# Patient Record
Sex: Male | Born: 1950 | Race: Black or African American | Hispanic: No | Marital: Married | Smoking: Former smoker
Health system: Southern US, Community
[De-identification: ages and names within clinical notes are randomized; demographics above are authoritative.]

## PROBLEM LIST (undated history)

## (undated) DIAGNOSIS — E079 Disorder of thyroid, unspecified: Secondary | ICD-10-CM

## (undated) DIAGNOSIS — E785 Hyperlipidemia, unspecified: Secondary | ICD-10-CM

## (undated) DIAGNOSIS — I10 Essential (primary) hypertension: Secondary | ICD-10-CM

## (undated) HISTORY — DX: Hyperlipidemia, unspecified: E78.5

## (undated) HISTORY — DX: Disorder of thyroid, unspecified: E07.9

## (undated) HISTORY — PX: PROSTATE SURGERY: SHX751

## (undated) HISTORY — PX: OTHER SURGICAL HISTORY: SHX169

## (undated) NOTE — *Deleted (*Deleted)
Prostate Biopsy Procedure   Informed consent was obtained after discussing risks/benefits of the procedure.  A time out was performed to ensure correct patient identity.  Pre-Procedure: - Last PSA Level: 6.6 on 03/07/2020 - Gentamicin given prophylactically - Levaquin 500 mg administered PO -Transrectal Ultrasound performed revealing a *** gm prostate -No significant hypoechoic or median lobe noted  Procedure: - Prostate block performed using 10 cc 1% lidocaine and biopsies taken from sextant areas, a total of 12 under ultrasound guidance.  Post-Procedure: - Patient tolerated the procedure well - He was counseled to seek immediate medical attention if experiences any severe pain, significant bleeding, or fevers - Return in one week to discuss biopsy results   I, Theador Hawthorne, am acting as a scribe for Dr. Vanna Scotland.  {Add Production assistant, radio Statement}

---

## 2006-06-05 ENCOUNTER — Emergency Department: Payer: Self-pay

## 2007-09-16 ENCOUNTER — Ambulatory Visit: Payer: Self-pay | Admitting: Urology

## 2007-09-22 ENCOUNTER — Ambulatory Visit: Payer: Self-pay | Admitting: Urology

## 2008-11-20 ENCOUNTER — Inpatient Hospital Stay: Payer: Self-pay | Admitting: *Deleted

## 2010-04-11 ENCOUNTER — Observation Stay: Payer: Self-pay | Admitting: Internal Medicine

## 2010-04-19 ENCOUNTER — Ambulatory Visit: Payer: Self-pay | Admitting: Family Medicine

## 2010-04-28 ENCOUNTER — Ambulatory Visit: Payer: Self-pay | Admitting: Rheumatology

## 2010-12-26 ENCOUNTER — Ambulatory Visit: Payer: Self-pay | Admitting: Gastroenterology

## 2010-12-27 LAB — PATHOLOGY REPORT

## 2014-10-03 ENCOUNTER — Other Ambulatory Visit: Payer: Self-pay

## 2014-10-03 ENCOUNTER — Emergency Department
Admission: EM | Admit: 2014-10-03 | Discharge: 2014-10-03 | Disposition: A | Discharge status: Home / Self Care | Payer: 59 | Attending: Emergency Medicine | Admitting: Emergency Medicine

## 2014-10-03 ENCOUNTER — Encounter: Payer: Self-pay | Admitting: Emergency Medicine

## 2014-10-03 DIAGNOSIS — Z87891 Personal history of nicotine dependence: Secondary | ICD-10-CM | POA: Diagnosis not present

## 2014-10-03 DIAGNOSIS — R55 Syncope and collapse: Secondary | ICD-10-CM | POA: Diagnosis not present

## 2014-10-03 DIAGNOSIS — E860 Dehydration: Secondary | ICD-10-CM | POA: Insufficient documentation

## 2014-10-03 DIAGNOSIS — Z79899 Other long term (current) drug therapy: Secondary | ICD-10-CM | POA: Insufficient documentation

## 2014-10-03 DIAGNOSIS — I10 Essential (primary) hypertension: Secondary | ICD-10-CM | POA: Insufficient documentation

## 2014-10-03 DIAGNOSIS — R42 Dizziness and giddiness: Secondary | ICD-10-CM | POA: Diagnosis present

## 2014-10-03 DIAGNOSIS — R630 Anorexia: Secondary | ICD-10-CM | POA: Insufficient documentation

## 2014-10-03 HISTORY — DX: Essential (primary) hypertension: I10

## 2014-10-03 LAB — BASIC METABOLIC PANEL
Anion gap: 9 (ref 5–15)
BUN: 27 mg/dL — ABNORMAL HIGH (ref 6–20)
CHLORIDE: 104 mmol/L (ref 101–111)
CO2: 24 mmol/L (ref 22–32)
Calcium: 8.9 mg/dL (ref 8.9–10.3)
Creatinine, Ser: 1.49 mg/dL — ABNORMAL HIGH (ref 0.61–1.24)
GFR calc non Af Amer: 49 mL/min — ABNORMAL LOW (ref >60)
GFR, EST AFRICAN AMERICAN: 56 mL/min — AB (ref >60)
Glucose, Bld: 165 mg/dL — ABNORMAL HIGH (ref 65–99)
POTASSIUM: 3.4 mmol/L — AB (ref 3.5–5.1)
SODIUM: 137 mmol/L (ref 135–145)

## 2014-10-03 LAB — CBC WITH DIFFERENTIAL/PLATELET
Basophils Absolute: 0.1 10*3/uL (ref 0–0.1)
Basophils Relative: 1 %
Eosinophils Absolute: 0.2 10*3/uL (ref 0–0.7)
Eosinophils Relative: 2 %
HEMATOCRIT: 48.4 % (ref 40.0–52.0)
HEMOGLOBIN: 15.8 g/dL (ref 13.0–18.0)
LYMPHS PCT: 29 %
Lymphs Abs: 2.1 10*3/uL (ref 1.0–3.6)
MCH: 26.9 pg (ref 26.0–34.0)
MCHC: 32.7 g/dL (ref 32.0–36.0)
MCV: 82.3 fL (ref 80.0–100.0)
MONO ABS: 0.8 10*3/uL (ref 0.2–1.0)
MONOS PCT: 11 %
NEUTROS ABS: 4.3 10*3/uL (ref 1.4–6.5)
Neutrophils Relative %: 57 %
Platelets: 207 10*3/uL (ref 150–440)
RBC: 5.88 MIL/uL (ref 4.40–5.90)
RDW: 15 % — ABNORMAL HIGH (ref 11.5–14.5)
WBC: 7.4 10*3/uL (ref 3.8–10.6)

## 2014-10-03 LAB — TROPONIN I: Troponin I: <0.03 ng/mL (ref <0.031)

## 2014-10-03 LAB — GLUCOSE, CAPILLARY: GLUCOSE-CAPILLARY: 109 mg/dL — AB (ref 70–99)

## 2014-10-03 MED ORDER — SODIUM CHLORIDE 0.9 % IV BOLUS (SEPSIS)
1000.0000 mL | Freq: Once | INTRAVENOUS | Status: AC
  Administered 2014-10-03: 1000 mL via INTRAVENOUS

## 2014-10-03 MED ORDER — SODIUM CHLORIDE 0.9 % IV SOLN
1000.0000 mL | Freq: Once | INTRAVENOUS | Status: AC
  Administered 2014-10-03: 1000 mL via INTRAVENOUS

## 2014-10-03 NOTE — Discharge Instructions (Signed)
Dehydration, Adult  As we discussed I believe her episode of weakness and passing out today was because of dehydration. I recommend for the next 2 days that you make you are drinking plenty of fluids, stay well-hydrated, and avoid exertional activities. Please stop your amlodipine and losartan for at least the next 3 days.  Follow-up with your doctor in the next 1-2 days in the clinic.  Return to the ER right away should she develop another episode of weakness, you passout, you have chest pain, or any other new concerns or symptoms arise.  Dehydration means your body does not have as much fluid as it needs. Your kidneys, brain, and heart will not work properly without the right amount of fluids and salt.  HOME CARE  Ask your doctor how to replace body fluid losses (rehydrate).  Drink enough fluids to keep your pee (urine) clear or pale yellow.  Drink small amounts of fluids often if you feel sick to your stomach (nauseous) or throw up (vomit).  Eat like you normally do.  Avoid:  Foods or drinks high in sugar.  Bubbly (carbonated) drinks.  Juice.  Very hot or cold fluids.  Drinks with caffeine.  Fatty, greasy foods.  Alcohol.  Tobacco.  Eating too much.  Gelatin desserts.  Wash your hands to avoid spreading germs (bacteria, viruses).  Only take medicine as told by your doctor.  Keep all doctor visits as told. GET HELP RIGHT AWAY IF:   You cannot drink something without throwing up.  You get worse even with treatment.  Your vomit has blood in it or looks greenish.  Your poop (stool) has blood in it or looks black and tarry.  You have not peed in 6 to 8 hours.  You pee a small amount of very dark pee.  You have a fever.  You pass out (faint).  You have belly (abdominal) pain that gets worse or stays in one spot (localizes).  You have a rash, stiff neck, or bad headache.  You get easily annoyed, sleepy, or are hard to wake up.  You feel weak, dizzy,  or very thirsty. MAKE SURE YOU:   Understand these instructions.  Will watch your condition.  Will get help right away if you are not doing well or get worse. Document Released: 03/10/2009 Document Revised: 08/06/2011 Document Reviewed: 01/01/2011 [facility] Patient Information 2015 [place], [place]. This information is not intended to replace advice given to you by your health care provider. Make sure you discuss any questions you have with your health care provider.  Vasovagal Syncope, Adult Syncope, commonly known as fainting, is a temporary loss of consciousness. It occurs when the blood flow to the brain is reduced. Vasovagal syncope (also called neurocardiogenic syncope) is a fainting spell in which the blood flow to the brain is reduced because of a sudden drop in heart rate and blood pressure. Vasovagal syncope occurs when the brain and the cardiovascular system (blood vessels) do not adequately communicate and respond to each other. This is the most common cause of fainting. It often occurs in response to fear or some other type of emotional or physical stress. The body has a reaction in which the heart starts beating too slowly or the blood vessels expand, reducing blood pressure. This type of fainting spell is generally considered harmless. However, injuries can occur if a person takes a sudden fall during a fainting spell.  CAUSES  Vasovagal syncope occurs when a person's blood pressure and heart rate decrease suddenly,  usually in response to a trigger. Many things and situations can trigger an episode. Some of these include:   Pain.   Fear.   The sight of blood or medical procedures, such as blood being drawn from a vein.   Common activities, such as coughing, swallowing, stretching, or going to the bathroom.   Emotional stress.   Prolonged standing, especially in a warm environment.   Lack of sleep or rest.   Prolonged lack of food.   Prolonged lack of fluids.    Recent illness.  The use of certain drugs that affect blood pressure, such as cocaine, alcohol, marijuana, inhalants, and opiates.  SYMPTOMS  Before the fainting episode, you may:   Feel dizzy or light headed.   Become pale.  Sense that you are going to faint.   Feel like the room is spinning.   Have tunnel vision, only seeing directly in front of you.   Feel sick to your stomach (nauseous).   See spots or slowly lose vision.   Hear ringing in your ears.   Have a headache.   Feel warm and sweaty.   Feel a sensation of pins and needles. During the fainting spell, you will generally be unconscious for no longer than a couple minutes before waking up and returning to normal. If you get up too quickly before your body can recover, you may faint again. Some twitching or jerky movements may occur during the fainting spell.  DIAGNOSIS  Your caregiver will ask about your symptoms, take a medical history, and perform a physical exam. Various tests may be done to rule out other causes of fainting. These may include blood tests and tests to check the heart, such as electrocardiography, echocardiography, and possibly an electrophysiology study. When other causes have been ruled out, a test may be done to check the body's response to changes in position (tilt table test). TREATMENT  Most cases of vasovagal syncope do not require treatment. Your caregiver may recommend ways to avoid fainting triggers and may provide home strategies for preventing fainting. If you must be exposed to a possible trigger, you can drink additional fluids to help reduce your chances of having an episode of vasovagal syncope. If you have warning signs of an oncoming episode, you can respond by positioning yourself favorably (lying down). If your fainting spells continue, you may be given medicines to prevent fainting. Some medicines may help make you more resistant to repeated episodes of vasovagal syncope.  Special exercises or compression stockings may be recommended. In rare cases, the surgical placement of a pacemaker is considered. HOME CARE INSTRUCTIONS   Learn to identify the warning signs of vasovagal syncope.   Sit or lie down at the first warning sign of a fainting spell. If sitting, put your head down between your legs. If you lie down, swing your legs up in the air to increase blood flow to the brain.   Avoid hot tubs and saunas.  Avoid prolonged standing.  Drink enough fluids to keep your urine clear or pale yellow. Avoid caffeine.  Increase salt in your diet as directed by your caregiver.   If you have to stand for a long time, perform movements such as:   Crossing your legs.   Flexing and stretching your leg muscles.   Squatting.   Moving your legs.   Bending over.   Only take over-the-counter or prescription medicines as directed by your caregiver. Do not suddenly stop any medicines without asking your caregiver first.  SEEK MEDICAL CARE IF:   Your fainting spells continue or happen more frequently in spite of treatment.   You lose consciousness for more than a couple minutes.  You have fainting spells during or after exercising or after being startled.   You have new symptoms that occur with the fainting spells, such as:   Shortness of breath.  Chest pain.   Irregular heartbeat.   You have episodes of twitching or jerky movements that last longer than a few seconds.  You have episodes of twitching or jerky movements without obvious fainting. SEEK IMMEDIATE MEDICAL CARE IF:   You have injuries or bleeding after a fainting spell.   You have episodes of twitching or jerky movements that last longer than 5 minutes.   You have more than one spell of twitching or jerky movements before returning to consciousness after fainting. MAKE SURE YOU:   Understand these instructions.  Will watch your condition.  Will get help right away if you  are not doing well or get worse. Document Released: 04/30/2012 Document Reviewed: 04/30/2012 [facility] Patient Information 2015 [place]. This information is not intended to replace advice given to you by your health care provider. Make sure you discuss any questions you have with your health care provider.

## 2014-10-03 NOTE — ED Notes (Signed)
Pt ambulated per MD request. NAD noted at this time. Pt denies distress, SOB, light-headedness.

## 2014-10-03 NOTE — ED Provider Notes (Signed)
[facility] Emergency Department Provider Note  ____________________________________________  Time seen: Approximately 9:40 AM  I have reviewed the triage vital signs and the nursing notes.   HISTORY  Chief Complaint Dizziness and Near Syncope    HPI Donald Le is a 64 y.o. male the past medical history of hypertension and previous syncopal episode approximately 5 years ago.  Patient states that this morning when he got up he felt dizzy and lightheaded. He had prayer with his niece, but shortly after felt very lightheaded and had to lay down in the bed. At one point while sitting up, he briefly passed out in the presence of his family. This lasted only a few seconds. He said he continued to feel very weak and dizziness which prompted him to call for EMS.  On arrival to triage, the patient had a another syncopal episode witnessed by the triage Le. He did not fall or injure himself. Patient reports that he was feeling very nauseated dizzy and sweaty just prior to this occurring. He denies any chest pain, headache, no numbness no tingling, no weakness in the arm or leg, no difficulty breathing.  Patient states that this feels the same as 5 years ago when he required lots of hydration and had to come off and changed some of his blood pressure medicines.  He presently takes 2 antihypertensives, which she has not made any changes to recently. Patient has not had any recent exertional activity, has not been out in the sun recently, and cannot explain why he would be dehydrated though he feels this way.   Past Medical History  Diagnosis Date  . Hypertension     There are no active problems to display for this patient.   Past Surgical History  Procedure Laterality Date  . Prostate surgery      Current Outpatient Rx  Name  Route  Sig  Dispense  Refill  . amLODipine (NORVASC) 10 MG tablet   Oral   Take 10 mg by mouth daily.         Marland Kitchen  losartan-hydrochlorothiazide (HYZAAR) 100-12.5 MG per tablet   Oral   Take 1 tablet by mouth daily.           Allergies Review of patient's allergies indicates no known allergies.  History reviewed. No pertinent family history.  Social History History  Substance Use Topics  . Smoking status: Former Research scientist (life sciences)  . Smokeless tobacco: Never Used  . Alcohol Use: No    Review of Systems Constitutional: No fever/chills Eyes: No visual changes. ENT: No sore throat. Cardiovascular: Denies chest pain. No palpitations Respiratory: Denies shortness of breath. Gastrointestinal: No abdominal pain.  Patient did have nausea and was sweaty as per prior to passing out. .no vomiting.  No diarrhea.  No constipation. Genitourinary: Negative for dysuria. Musculoskeletal: Negative for back pain. Skin: Negative for rash. Neurological: Negative for headaches, focal weakness or numbness. See history of present illness  10-point ROS otherwise negative.  ____________________________________________   PHYSICAL EXAM:  VITAL SIGNS: ED Triage Vitals  Enc Vitals Group     BP 10/03/14 0936 74/60 mmHg     Pulse Rate 10/03/14 0931 42     Resp 10/03/14 0938 20     Temp 10/03/14 0931 97.8 F (36.6 C)     Temp Source 10/03/14 0931 Oral     SpO2 10/03/14 0936 96 %     Weight 10/03/14 0950 211 lb 4.8 oz (95.845 kg)     Height 10/03/14  4193 6\' 3"  (1.905 m)     Head Cir --      Peak Flow --      Pain Score --      Pain Loc --      Pain Edu? --      Excl. in [place]? --     Constitutional: Alert and oriented. Well appearing and in no acute distress. Eyes: Conjunctivae are normal. PERRL. EOMI. Head: Atraumatic. Nose: No congestion/rhinnorhea. Mouth/Throat: Mucous membranes are moist.  Oropharynx non-erythematous. Neck: No stridor.  Cardiovascular: Normal rate, regular rhythm. Grossly normal heart sounds.  Good peripheral circulation. Respiratory: Normal respiratory effort.  No retractions. Lungs  CTAB. Gastrointestinal: Soft and nontender. No distention. No abdominal bruits. No CVA tenderness. Musculoskeletal: No lower extremity tenderness nor edema.  No joint effusions. Neurologic:  Normal speech and language. No gross focal neurologic deficits are appreciated. Speech is normal. No gait instability. Skin:  Skin is warm, dry and intact. No rash noted. Psychiatric: Mood and affect are normal. Speech and behavior are normal.  Patient exam is normal at this time. He is fully awake and alert.  ----------------------------------------- 9:57 AM on 10/03/2014 -----------------------------------------  The patient reports that he is feeling improved presently, his nausea and lightheadedness has much improved. We are able to have the patient sit up and he states he no longer feels dizzy, his blood pressure is now 790 systolic after receiving IV hydration.  ____________________________________________   LABS (all labs ordered are listed, but only abnormal results are displayed)  Labs Reviewed  CBC WITH DIFFERENTIAL/PLATELET - Abnormal; Notable for the following:    RDW 15.0 (*)    All other components within normal limits  BASIC METABOLIC PANEL - Abnormal; Notable for the following:    Potassium 3.4 (*)    Glucose, Bld 165 (*)    BUN 27 (*)    Creatinine, Ser 1.49 (*)    GFR calc non Af Amer 49 (*)    GFR calc Af Amer 56 (*)    All other components within normal limits  GLUCOSE, CAPILLARY - Abnormal; Notable for the following:    Glucose-Capillary 109 (*)    All other components within normal limits  TROPONIN I  POCT CBG (FASTING - GLUCOSE)-MANUAL ENTRY   ____________________________________________  EKG   Date: 10/03/2014  Rate: 55  Rhythm: normal sinus rate at cardiac, with first-degree AV block.  QRS Axis: normal  Intervals: normal  ST/T Wave abnormalities: normal  Conduction Disutrbances: none  Narrative Interpretation: Sinus bradycardia with first-degree AV block.  No ST elevation. Otherwise unremarkable EKG.     ____________________________________________  RADIOLOGY   ____________________________________________   PROCEDURES  Procedure(s) performed: None  Critical Care performed: No  ____________________________________________   INITIAL IMPRESSION / ASSESSMENT AND PLAN / ED COURSE  Pertinent labs & imaging results that were available during my care of the patient were reviewed by me and considered in my medical decision making (see chart for details).  Syncope. Patient likely had syncope due to hypotension. The etiology of this is not 100% clear upon arrival, however it does not be appear to be acute blood loss and/or cardiac etiology. I suspect vasovagal versus orthostatic due to some dehydration and potentially due to his antihypertensive medications.  There is no evidence of neurologic abnormality. No evidence of hemorrhage. No evidence of cardiovascular involvement. No palpitations.  Plan of care is to hydrate generously, observe patient, obtain labs, and follow clinically. I anticipate if the patient's labs are normal and he appears  improved resolution of hypotension thereafter it would be most appropriate to have patient stop his antihypertensives at least briefly until he can follow up with primary care physician.  ----------------------------------------- 11:59 AM on 10/03/2014 -----------------------------------------  Reevaluation, patient is awake alert and oriented. He states he feels well at this time. He has no concerns. I discussed his labs with him. His labs seem to suggest some mild dehydration. He is well-hydrated now. He is normotensive.  I suspect his syncope was due to some mild dehydration, as he was quite orthostatic. He now appears well. We will check orthostatics and have him ambulate to see that he is feeling improved.  I discussed with the patient that I would hold his blood pressure medications for at  least the next 2-3 days. If he checks his blood pressure at home and notes that it's coming up into the 120s or higher on a regular basis then he can restart his amlodipine. He will follow up with Dr. Rockey Situ his primary care physician this week.  Patient agrees to return to the ER right away if he develops chest pain, shortness of breath, his symptoms of weakness return, he passes out again, or other new concerns or symptoms arise. ____________________________________________   FINAL CLINICAL IMPRESSION(S) / ED DIAGNOSES  Final diagnoses:  Dehydration  Syncope, vasovagal      Delman Kitten, MD 10/27/14 [phone]

## 2014-10-03 NOTE — ED Notes (Addendum)
Patient states this am he developed dizziness and diaphoresis. Denies worsening with movement. Denies any pain.  During triage, patient had syncopal episode with hypotension. Patient acted like he was about to vomit, but was not alert. Upper extremities became contracted. Patient's mental status returned to normal after event.

## 2014-10-03 NOTE — ED Notes (Signed)
NAD noted at time of D/C. Pt denies questions or concerns. Pt ambulatory to the lobby at this time.  

## 2015-01-21 DIAGNOSIS — I10 Essential (primary) hypertension: Secondary | ICD-10-CM | POA: Insufficient documentation

## 2015-01-21 DIAGNOSIS — E079 Disorder of thyroid, unspecified: Secondary | ICD-10-CM | POA: Insufficient documentation

## 2015-01-21 DIAGNOSIS — E785 Hyperlipidemia, unspecified: Secondary | ICD-10-CM | POA: Insufficient documentation

## 2015-01-24 ENCOUNTER — Encounter: Payer: 59 | Admitting: Family Medicine

## 2015-01-24 ENCOUNTER — Telehealth: Payer: Self-pay

## 2015-01-24 MED ORDER — LOSARTAN POTASSIUM-HCTZ 100-12.5 MG PO TABS: 1.0000 | ORAL_TABLET | Freq: Every day | ORAL | Status: DC

## 2015-01-24 MED ORDER — AMLODIPINE BESYLATE 10 MG PO TABS: 10.0000 mg | ORAL_TABLET | Freq: Every day | ORAL | Status: DC

## 2015-01-24 NOTE — Telephone Encounter (Signed)
Patient was within 89months from last CPE, now scheduled for 03/16/15 Please send Amlodipine 10mg  and Losartan-HCTZ 100-12.5  Refills to [facility]

## 2015-03-15 ENCOUNTER — Encounter: Payer: Self-pay | Admitting: Family Medicine

## 2015-03-15 ENCOUNTER — Ambulatory Visit (INDEPENDENT_AMBULATORY_CARE_PROVIDER_SITE_OTHER): Payer: 59 | Admitting: Family Medicine

## 2015-03-15 VITALS — BP 133/81 | HR 58 | Temp 97.9°F | Ht 69.8 in | Wt 208.0 lb

## 2015-03-15 DIAGNOSIS — E785 Hyperlipidemia, unspecified: Secondary | ICD-10-CM

## 2015-03-15 DIAGNOSIS — Z23 Encounter for immunization: Secondary | ICD-10-CM

## 2015-03-15 DIAGNOSIS — E079 Disorder of thyroid, unspecified: Secondary | ICD-10-CM | POA: Diagnosis not present

## 2015-03-15 DIAGNOSIS — I10 Essential (primary) hypertension: Secondary | ICD-10-CM | POA: Diagnosis not present

## 2015-03-15 DIAGNOSIS — Z0000 Encounter for general adult medical examination without abnormal findings: Secondary | ICD-10-CM

## 2015-03-15 LAB — URINALYSIS, ROUTINE W REFLEX MICROSCOPIC
Bilirubin, UA: NEGATIVE
Glucose, UA: NEGATIVE
Ketones, UA: NEGATIVE
LEUKOCYTES UA: NEGATIVE
Nitrite, UA: NEGATIVE
PH UA: 6.5 (ref 5.0–7.5)
PROTEIN UA: NEGATIVE
RBC, UA: NEGATIVE
Specific Gravity, UA: 1.015 (ref 1.005–1.030)
Urobilinogen, Ur: 0.2 mg/dL (ref 0.2–1.0)

## 2015-03-15 MED ORDER — SILDENAFIL CITRATE 20 MG PO TABS: 20.0000 mg | ORAL_TABLET | ORAL | Status: DC

## 2015-03-15 MED ORDER — LOSARTAN POTASSIUM-HCTZ 100-12.5 MG PO TABS: 1.0000 | ORAL_TABLET | Freq: Every day | ORAL | Status: DC

## 2015-03-15 MED ORDER — ATORVASTATIN CALCIUM 40 MG PO TABS: 40.0000 mg | ORAL_TABLET | Freq: Every day | ORAL | Status: DC

## 2015-03-15 MED ORDER — VARDENAFIL HCL 20 MG PO TABS: 20.0000 mg | ORAL_TABLET | Freq: Every day | ORAL | Status: DC | PRN

## 2015-03-15 MED ORDER — AMLODIPINE BESYLATE 10 MG PO TABS: 10.0000 mg | ORAL_TABLET | Freq: Every day | ORAL | Status: DC

## 2015-03-15 NOTE — Assessment & Plan Note (Signed)
The current medical regimen is effective;  continue present plan and medications.  

## 2015-03-15 NOTE — Assessment & Plan Note (Signed)
Not on meds and followed by endo

## 2015-03-15 NOTE — Progress Notes (Signed)
BP 133/81 mmHg  Pulse 58  Temp(Src) 97.9 F (36.6 C)  Ht 5' 9.8" (1.773 m)  Wt 208 lb (94.348 kg)  BMI 30.01 kg/m2  SpO2 99%   Subjective:    Patient ID: Donald Le, male    DOB: Feb 19, 1951, 64 y.o.   MRN: [id number]  HPI: Donald Le is a 64 y.o. male  Chief Complaint  Patient presents with  . Annual Exam   patient doing well with blood pressure medication no side effects no complaints takes faithfully. Same with cholesterol medicine no side effects complaints takes faithfully Wants any samples of ED medication  Relevant past medical, surgical, family and social history reviewed and updated as indicated. Interim medical history since our last visit reviewed. Allergies and medications reviewed and updated.  Review of Systems  Constitutional: Negative.   HENT: Negative.   Eyes: Negative.   Respiratory: Negative.   Cardiovascular: Negative.   Gastrointestinal: Negative.   Endocrine: Negative.   Genitourinary: Negative.   Musculoskeletal: Negative.   Skin: Negative.   Allergic/Immunologic: Negative.   Neurological: Negative.   Hematological: Negative.   Psychiatric/Behavioral: Negative.     Per HPI unless specifically indicated above     Objective:    BP 133/81 mmHg  Pulse 58  Temp(Src) 97.9 F (36.6 C)  Ht 5' 9.8" (1.773 m)  Wt 208 lb (94.348 kg)  BMI 30.01 kg/m2  SpO2 99%  Wt Readings from Last 3 Encounters:  03/15/15 208 lb (94.348 kg)  10/05/14 214 lb (97.07 kg)  10/03/14 211 lb 4.8 oz (95.845 kg)    Physical Exam  Constitutional: He is oriented to person, place, and time. He appears well-developed and well-nourished.  HENT:  Head: Normocephalic and atraumatic.  Right Ear: External ear normal.  Left Ear: External ear normal.  Eyes: Conjunctivae and EOM are normal. Pupils are equal, round, and reactive to light.  Neck: Normal range of motion. Neck supple.  Cardiovascular: Normal rate, regular rhythm, normal heart sounds and intact  distal pulses.   Pulmonary/Chest: Effort normal and breath sounds normal.  Abdominal: Soft. Bowel sounds are normal. There is no splenomegaly or hepatomegaly.  Genitourinary: Rectum normal, prostate normal and penis normal.  Musculoskeletal: Normal range of motion.  Neurological: He is alert and oriented to person, place, and time. He has normal reflexes.  Skin: No rash noted. No erythema.  Psychiatric: He has a normal mood and affect. His behavior is normal. Judgment and thought content normal.    Results for orders placed or performed during the hospital encounter of 10/03/14  CBC WITH DIFFERENTIAL  Result Value Ref Range   WBC 7.4 3.8 - 10.6 K/uL   RBC 5.88 4.40 - 5.90 MIL/uL   Hemoglobin 15.8 13.0 - 18.0 g/dL   HCT 48.4 40.0 - 52.0 %   MCV 82.3 80.0 - 100.0 fL   MCH 26.9 26.0 - 34.0 pg   MCHC 32.7 32.0 - 36.0 g/dL   RDW 15.0 (H) 11.5 - 14.5 %   Platelets 207 150 - 440 K/uL   Neutrophils Relative % 57 %   Neutro Abs 4.3 1.4 - 6.5 K/uL   Lymphocytes Relative 29 %   Lymphs Abs 2.1 1.0 - 3.6 K/uL   Monocytes Relative 11 %   Monocytes Absolute 0.8 0.2 - 1.0 K/uL   Eosinophils Relative 2 %   Eosinophils Absolute 0.2 0 - 0.7 K/uL   Basophils Relative 1 %   Basophils Absolute 0.1 0 - 0.1 K/uL  Basic  metabolic panel  Result Value Ref Range   Sodium 137 135 - 145 mmol/L   Potassium 3.4 (L) 3.5 - 5.1 mmol/L   Chloride 104 101 - 111 mmol/L   CO2 24 22 - 32 mmol/L   Glucose, Bld 165 (H) 65 - 99 mg/dL   BUN 27 (H) 6 - 20 mg/dL   Creatinine, Ser 1.49 (H) 0.61 - 1.24 mg/dL   Calcium 8.9 8.9 - 10.3 mg/dL   GFR calc non Af Amer 49 (L) >60 mL/min   GFR calc Af Amer 56 (L) >60 mL/min   Anion gap 9 5 - 15  Troponin I  Result Value Ref Range   Troponin I <0.03 <0.031 ng/mL  Glucose, capillary  Result Value Ref Range   Glucose-Capillary 109 (H) 70 - 99 mg/dL      Assessment & Plan:   Problem List Items Addressed This Visit      Cardiovascular and Mediastinum   Hypertension     The current medical regimen is effective;  continue present plan and medications.       Relevant Medications   amLODipine (NORVASC) 10 MG tablet   atorvastatin (LIPITOR) 40 MG tablet   losartan-hydrochlorothiazide (HYZAAR) 100-12.5 MG tablet   sildenafil (REVATIO) 20 MG tablet   vardenafil (LEVITRA) 20 MG tablet     Endocrine   Thyroid disease    Not on meds and followed by endo        Other   Hyperlipidemia    The current medical regimen is effective;  continue present plan and medications.       Relevant Medications   amLODipine (NORVASC) 10 MG tablet   atorvastatin (LIPITOR) 40 MG tablet   losartan-hydrochlorothiazide (HYZAAR) 100-12.5 MG tablet   sildenafil (REVATIO) 20 MG tablet   vardenafil (LEVITRA) 20 MG tablet    Other Visit Diagnoses    Immunization due    -  Primary    Relevant Orders    Flu Vaccine QUAD 36+ mos PF IM (Fluarix & Fluzone Quad PF) (Completed)    PE (physical exam), annual        Relevant Orders    Comprehensive metabolic panel    Lipid panel    CBC with Differential/Platelet    PSA    Urinalysis, Routine w reflex microscopic (not at [facility])    TSH        Follow up plan: Return in about 6 months (around 09/13/2015), or if symptoms worsen or fail to improve, for Recheck lipids, ALT AST, and BMP.

## 2015-03-16 ENCOUNTER — Telehealth: Payer: Self-pay | Admitting: Family Medicine

## 2015-03-16 DIAGNOSIS — R972 Elevated prostate specific antigen [PSA]: Secondary | ICD-10-CM

## 2015-03-16 LAB — LIPID PANEL
CHOLESTEROL TOTAL: 189 mg/dL (ref 100–199)
Chol/HDL Ratio: 3.9 ratio units (ref 0.0–5.0)
HDL: 48 mg/dL (ref >39)
LDL Calculated: 120 mg/dL — ABNORMAL HIGH (ref 0–99)
TRIGLYCERIDES: 106 mg/dL (ref 0–149)
VLDL CHOLESTEROL CAL: 21 mg/dL (ref 5–40)

## 2015-03-16 LAB — COMPREHENSIVE METABOLIC PANEL
ALK PHOS: 58 IU/L (ref 39–117)
ALT: 18 IU/L (ref 0–44)
AST: 21 IU/L (ref 0–40)
Albumin/Globulin Ratio: 1.6 (ref 1.1–2.5)
Albumin: 4.4 g/dL (ref 3.6–4.8)
BILIRUBIN TOTAL: 0.4 mg/dL (ref 0.0–1.2)
BUN/Creatinine Ratio: 10 (ref 10–22)
BUN: 10 mg/dL (ref 8–27)
CALCIUM: 9.7 mg/dL (ref 8.6–10.2)
CHLORIDE: 99 mmol/L (ref 97–106)
CO2: 24 mmol/L (ref 18–29)
CREATININE: 1.04 mg/dL (ref 0.76–1.27)
GFR calc Af Amer: 88 mL/min/{1.73_m2} (ref >59)
GFR calc non Af Amer: 76 mL/min/{1.73_m2} (ref >59)
Globulin, Total: 2.8 g/dL (ref 1.5–4.5)
Glucose: 118 mg/dL — ABNORMAL HIGH (ref 65–99)
Potassium: 4.2 mmol/L (ref 3.5–5.2)
Sodium: 138 mmol/L (ref 136–144)
TOTAL PROTEIN: 7.2 g/dL (ref 6.0–8.5)

## 2015-03-16 LAB — CBC WITH DIFFERENTIAL/PLATELET
BASOS: 1 %
Basophils Absolute: 0 10*3/uL (ref 0.0–0.2)
EOS (ABSOLUTE): 0.1 10*3/uL (ref 0.0–0.4)
EOS: 2 %
HEMATOCRIT: 47.8 % (ref 37.5–51.0)
Hemoglobin: 15.8 g/dL (ref 12.6–17.7)
Immature Grans (Abs): 0 10*3/uL (ref 0.0–0.1)
Immature Granulocytes: 0 %
LYMPHS ABS: 1.6 10*3/uL (ref 0.7–3.1)
Lymphs: 36 %
MCH: 27.1 pg (ref 26.6–33.0)
MCHC: 33.1 g/dL (ref 31.5–35.7)
MCV: 82 fL (ref 79–97)
MONOS ABS: 0.2 10*3/uL (ref 0.1–0.9)
Monocytes: 5 %
NEUTROS ABS: 2.4 10*3/uL (ref 1.4–7.0)
NEUTROS PCT: 56 %
Platelets: 219 10*3/uL (ref 150–379)
RBC: 5.83 x10E6/uL — ABNORMAL HIGH (ref 4.14–5.80)
RDW: 16.6 % — AB (ref 12.3–15.4)
WBC: 4.3 10*3/uL (ref 3.4–10.8)

## 2015-03-16 LAB — PSA: Prostate Specific Ag, Serum: 4 ng/mL (ref 0.0–4.0)

## 2015-03-16 LAB — TSH: TSH: 1.55 u[IU]/mL (ref 0.450–4.500)

## 2015-03-16 NOTE — Telephone Encounter (Signed)
-----   Message from Wynn Maudlin, [place] sent at 03/16/2015  5:12 PM EDT ----- labs

## 2015-03-16 NOTE — Telephone Encounter (Signed)
Phone call Discussed with patient elevated PSA we will recheck PSA this winter or spring.

## 2015-04-18 ENCOUNTER — Telehealth: Payer: Self-pay

## 2015-04-18 MED ORDER — TADALAFIL 20 MG PO TABS: 10.0000 mg | ORAL_TABLET | ORAL | Status: DC | PRN

## 2015-04-18 NOTE — Telephone Encounter (Signed)
Patient would like Rx for cialis sent to [facility].

## 2015-07-06 ENCOUNTER — Telehealth: Payer: Self-pay | Admitting: Family Medicine

## 2015-07-06 NOTE — Telephone Encounter (Signed)
Patient to make appointment.

## 2015-07-06 NOTE — Telephone Encounter (Signed)
Patient called stating that his insurance company wont cover his medication LEVITRA 20 MG TABLET. If it can be fixed, any questions feel free to call patient, thanks.

## 2015-07-18 ENCOUNTER — Ambulatory Visit: Payer: 59 | Admitting: Family Medicine

## 2015-09-13 ENCOUNTER — Ambulatory Visit: Payer: 59 | Admitting: Family Medicine

## 2015-09-14 ENCOUNTER — Encounter: Payer: Self-pay | Admitting: Family Medicine

## 2015-09-14 ENCOUNTER — Ambulatory Visit (INDEPENDENT_AMBULATORY_CARE_PROVIDER_SITE_OTHER): Payer: 59 | Admitting: Family Medicine

## 2015-09-14 VITALS — BP 137/81 | HR 58 | Temp 97.9°F | Ht 69.6 in | Wt 213.0 lb

## 2015-09-14 DIAGNOSIS — R972 Elevated prostate specific antigen [PSA]: Secondary | ICD-10-CM

## 2015-09-14 DIAGNOSIS — Z0000 Encounter for general adult medical examination without abnormal findings: Secondary | ICD-10-CM | POA: Diagnosis not present

## 2015-09-14 DIAGNOSIS — E785 Hyperlipidemia, unspecified: Secondary | ICD-10-CM | POA: Diagnosis not present

## 2015-09-14 DIAGNOSIS — I10 Essential (primary) hypertension: Secondary | ICD-10-CM | POA: Diagnosis not present

## 2015-09-14 LAB — LP+ALT+AST PICCOLO, WAIVED
ALT (SGPT) PICCOLO, WAIVED: 21 U/L (ref 10–47)
AST (SGOT) Piccolo, Waived: 27 U/L (ref 11–38)
CHOLESTEROL PICCOLO, WAIVED: 189 mg/dL (ref <200)
Chol/HDL Ratio Piccolo,Waive: 3.7 mg/dL
HDL CHOL PICCOLO, WAIVED: 50 mg/dL — AB (ref >59)
LDL CHOL CALC PICCOLO WAIVED: 118 mg/dL — AB (ref <100)
Triglycerides Piccolo,Waived: 100 mg/dL (ref <150)
VLDL CHOL CALC PICCOLO,WAIVE: 20 mg/dL (ref <30)

## 2015-09-14 NOTE — Assessment & Plan Note (Signed)
The current medical regimen is effective;  continue present plan and medications.  

## 2015-09-14 NOTE — Progress Notes (Signed)
BP 137/81 mmHg  Pulse 58  Temp(Src) 97.9 F (36.6 C)  Ht 5' 9.6" (1.768 m)  Wt 213 lb (96.616 kg)  BMI 30.91 kg/m2  SpO2 99%   Subjective:    Patient ID: Donald Le, male    DOB: December 04, 1950, 65 y.o.   MRN: [id number]  HPI: Donald Le is a 65 y.o. male  Chief Complaint  Patient presents with  . Hyperlipidemia  . Hypertension  Patient doing well on medications no side effects with good control of blood pressure and cholesterol takes medications faithfully Did have what felt like may be a bug or something in his ear earlier this week once his ears checked  Relevant past medical, surgical, family and social history reviewed and updated as indicated. Interim medical history since our last visit reviewed. Allergies and medications reviewed and updated.  Review of Systems  Constitutional: Negative.   Respiratory: Negative.   Cardiovascular: Negative.     Per HPI unless specifically indicated above     Objective:    BP 137/81 mmHg  Pulse 58  Temp(Src) 97.9 F (36.6 C)  Ht 5' 9.6" (1.768 m)  Wt 213 lb (96.616 kg)  BMI 30.91 kg/m2  SpO2 99%  Wt Readings from Last 3 Encounters:  09/14/15 213 lb (96.616 kg)  03/15/15 208 lb (94.348 kg)  10/05/14 214 lb (97.07 kg)    Physical Exam  Constitutional: He is oriented to person, place, and time. He appears well-developed and well-nourished. No distress.  HENT:  Head: Normocephalic and atraumatic.  Right Ear: Hearing and external ear normal.  Left Ear: Hearing and external ear normal.  Nose: Nose normal.  No evidence of foreign body ears completely clear  Eyes: Conjunctivae and lids are normal. Right eye exhibits no discharge. Left eye exhibits no discharge. No scleral icterus.  Pulmonary/Chest: Effort normal. No respiratory distress.  Musculoskeletal: Normal range of motion.  Neurological: He is alert and oriented to person, place, and time.  Skin: Skin is intact. No rash noted.  Psychiatric: He has a  normal mood and affect. His speech is normal and behavior is normal. Judgment and thought content normal. Cognition and memory are normal.    Results for orders placed or performed in visit on 03/15/15  Comprehensive metabolic panel  Result Value Ref Range   Glucose 118 (H) 65 - 99 mg/dL   BUN 10 8 - 27 mg/dL   Creatinine, Ser 1.04 0.76 - 1.27 mg/dL   GFR calc non Af Amer 76 >59 mL/min/1.73   GFR calc Af Amer 88 >59 mL/min/1.73   BUN/Creatinine Ratio 10 10 - 22   Sodium 138 136 - 144 mmol/L   Potassium 4.2 3.5 - 5.2 mmol/L   Chloride 99 97 - 106 mmol/L   CO2 24 18 - 29 mmol/L   Calcium 9.7 8.6 - 10.2 mg/dL   Total Protein 7.2 6.0 - 8.5 g/dL   Albumin 4.4 3.6 - 4.8 g/dL   Globulin, Total 2.8 1.5 - 4.5 g/dL   Albumin/Globulin Ratio 1.6 1.1 - 2.5   Bilirubin Total 0.4 0.0 - 1.2 mg/dL   Alkaline Phosphatase 58 39 - 117 IU/L   AST 21 0 - 40 IU/L   ALT 18 0 - 44 IU/L  Lipid panel  Result Value Ref Range   Cholesterol, Total 189 100 - 199 mg/dL   Triglycerides 106 0 - 149 mg/dL   HDL 48 >39 mg/dL   VLDL Cholesterol Cal 21 5 - 40 mg/dL  LDL Calculated 120 (H) 0 - 99 mg/dL   Chol/HDL Ratio 3.9 0.0 - 5.0 ratio units  CBC with Differential/Platelet  Result Value Ref Range   WBC 4.3 3.4 - 10.8 x10E3/uL   RBC 5.83 (H) 4.14 - 5.80 x10E6/uL   Hemoglobin 15.8 12.6 - 17.7 g/dL   Hematocrit 47.8 37.5 - 51.0 %   MCV 82 79 - 97 fL   MCH 27.1 26.6 - 33.0 pg   MCHC 33.1 31.5 - 35.7 g/dL   RDW 16.6 (H) 12.3 - 15.4 %   Platelets 219 150 - 379 x10E3/uL   Neutrophils 56 %   Lymphs 36 %   Monocytes 5 %   Eos 2 %   Basos 1 %   Neutrophils Absolute 2.4 1.4 - 7.0 x10E3/uL   Lymphocytes Absolute 1.6 0.7 - 3.1 x10E3/uL   Monocytes Absolute 0.2 0.1 - 0.9 x10E3/uL   EOS (ABSOLUTE) 0.1 0.0 - 0.4 x10E3/uL   Basophils Absolute 0.0 0.0 - 0.2 x10E3/uL   Immature Granulocytes 0 %   Immature Grans (Abs) 0.0 0.0 - 0.1 x10E3/uL  PSA  Result Value Ref Range   Prostate Specific Ag, Serum 4.0 0.0 - 4.0  ng/mL  Urinalysis, Routine w reflex microscopic (not at [facility])  Result Value Ref Range   Specific Gravity, UA 1.015 1.005 - 1.030   pH, UA 6.5 5.0 - 7.5   Color, UA Yellow Yellow   Appearance Ur Clear Clear   Leukocytes, UA Negative Negative   Protein, UA Negative Negative/Trace   Glucose, UA Negative Negative   Ketones, UA Negative Negative   RBC, UA Negative Negative   Bilirubin, UA Negative Negative   Urobilinogen, Ur 0.2 0.2 - 1.0 mg/dL   Nitrite, UA Negative Negative  TSH  Result Value Ref Range   TSH 1.550 0.450 - 4.500 uIU/mL      Assessment & Plan:   Problem List Items Addressed This Visit      Cardiovascular and Mediastinum   Hypertension - Primary    The current medical regimen is effective;  continue present plan and medications.       Relevant Orders   LP+ALT+AST Piccolo, Waived   Basic metabolic panel     Other   Hyperlipidemia    The current medical regimen is effective;  continue present plan and medications.       Relevant Orders   LP+ALT+AST Piccolo, Waived   Basic metabolic panel    Other Visit Diagnoses    Elevated PSA        Health care maintenance        Relevant Orders    Hepatitis C Antibody    HIV antibody        Follow up plan: Return in about 6 months (around 03/15/2016) for Physical Exam.

## 2015-09-15 ENCOUNTER — Encounter: Payer: Self-pay | Admitting: Family Medicine

## 2015-09-15 LAB — BASIC METABOLIC PANEL
BUN / CREAT RATIO: 12 (ref 10–24)
BUN: 14 mg/dL (ref 8–27)
CALCIUM: 9.2 mg/dL (ref 8.6–10.2)
CHLORIDE: 101 mmol/L (ref 96–106)
CO2: 20 mmol/L (ref 18–29)
Creatinine, Ser: 1.21 mg/dL (ref 0.76–1.27)
GFR calc Af Amer: 73 mL/min/{1.73_m2} (ref >59)
GFR calc non Af Amer: 63 mL/min/{1.73_m2} (ref >59)
Glucose: 163 mg/dL — ABNORMAL HIGH (ref 65–99)
Potassium: 4 mmol/L (ref 3.5–5.2)
Sodium: 141 mmol/L (ref 134–144)

## 2015-09-15 LAB — HIV ANTIBODY (ROUTINE TESTING W REFLEX): HIV SCREEN 4TH GENERATION: NONREACTIVE

## 2015-09-15 LAB — HEPATITIS C ANTIBODY

## 2015-09-15 LAB — PSA: PROSTATE SPECIFIC AG, SERUM: 4 ng/mL (ref 0.0–4.0)

## 2015-11-16 ENCOUNTER — Telehealth: Payer: Self-pay | Admitting: Family Medicine

## 2015-11-16 MED ORDER — LOSARTAN POTASSIUM-HCTZ 100-12.5 MG PO TABS: 1.0000 | ORAL_TABLET | Freq: Every day | ORAL | Status: DC

## 2015-11-16 NOTE — Telephone Encounter (Signed)
Pt would like to have refill for losartan-hydrochlorothiazide (HYZAAR) 100-12.5 MG tablet sent to [facility].

## 2016-02-24 ENCOUNTER — Encounter (INDEPENDENT_AMBULATORY_CARE_PROVIDER_SITE_OTHER): Payer: Self-pay

## 2016-03-21 ENCOUNTER — Ambulatory Visit (INDEPENDENT_AMBULATORY_CARE_PROVIDER_SITE_OTHER): Payer: BLUE CROSS/BLUE SHIELD | Admitting: Family Medicine

## 2016-03-21 ENCOUNTER — Encounter: Payer: Self-pay | Admitting: Family Medicine

## 2016-03-21 ENCOUNTER — Telehealth: Payer: Self-pay | Admitting: Family Medicine

## 2016-03-21 VITALS — BP 156/93 | HR 62 | Temp 97.5°F | Ht 70.5 in | Wt 210.0 lb

## 2016-03-21 DIAGNOSIS — E782 Mixed hyperlipidemia: Secondary | ICD-10-CM

## 2016-03-21 DIAGNOSIS — R972 Elevated prostate specific antigen [PSA]: Secondary | ICD-10-CM | POA: Diagnosis not present

## 2016-03-21 DIAGNOSIS — I10 Essential (primary) hypertension: Secondary | ICD-10-CM | POA: Diagnosis not present

## 2016-03-21 DIAGNOSIS — Z0000 Encounter for general adult medical examination without abnormal findings: Secondary | ICD-10-CM

## 2016-03-21 LAB — URINALYSIS, ROUTINE W REFLEX MICROSCOPIC
BILIRUBIN UA: NEGATIVE
Glucose, UA: NEGATIVE
KETONES UA: NEGATIVE
LEUKOCYTES UA: NEGATIVE
NITRITE UA: NEGATIVE
PROTEIN UA: NEGATIVE
RBC UA: NEGATIVE
SPEC GRAV UA: 1.015 (ref 1.005–1.030)
Urobilinogen, Ur: 0.2 mg/dL (ref 0.2–1.0)
pH, UA: 6 (ref 5.0–7.5)

## 2016-03-21 MED ORDER — LOSARTAN POTASSIUM-HCTZ 100-12.5 MG PO TABS: 1.0000 | ORAL_TABLET | Freq: Every day | ORAL | 4 refills | Status: DC

## 2016-03-21 MED ORDER — ATORVASTATIN CALCIUM 40 MG PO TABS: 40.0000 mg | ORAL_TABLET | Freq: Every day | ORAL | 4 refills | Status: DC

## 2016-03-21 MED ORDER — TADALAFIL 20 MG PO TABS: 10.0000 mg | ORAL_TABLET | ORAL | 11 refills | Status: DC | PRN

## 2016-03-21 MED ORDER — AMLODIPINE BESYLATE 10 MG PO TABS: 10.0000 mg | ORAL_TABLET | Freq: Every day | ORAL | 4 refills | Status: DC

## 2016-03-21 MED ORDER — LOSARTAN POTASSIUM-HCTZ 100-25 MG PO TABS: 1.0000 | ORAL_TABLET | Freq: Every day | ORAL | 4 refills | Status: DC

## 2016-03-21 NOTE — Telephone Encounter (Signed)
Pharmacy called and would like to get clarification on dosage for losartan-hydrochlorothiazide (HYZAAR) 100-25 MG tablet.

## 2016-03-21 NOTE — Assessment & Plan Note (Signed)
Blood pressure poor control with poor control will increase losartan hydrochlorothiazide to 100/25 discontinue the losartan hydrochlorothiazide 100/12.5. Gave patient written directions Recheck 1 month blood pressure control also check BMP.

## 2016-03-21 NOTE — Telephone Encounter (Signed)
Pharmacy needed verification on dose change.

## 2016-03-21 NOTE — Progress Notes (Signed)
BP (!) 156/93   Pulse 62   Temp 97.5 F (36.4 C)   Ht 5' 10.5" (1.791 m)   Wt 210 lb (95.3 kg)   SpO2 97%   BMI 29.71 kg/m    Subjective:    Patient ID: Donald Le, male    DOB: Sep 21, 1950, 65 y.o.   MRN: [id number]  HPI: Donald Le is a 65 y.o. male ```````````````````````````````````````````````````````````````````````````````` Chief Complaint  Patient presents with  . Annual Exam   Patient's for physical exam no real complaints or concerns working part-time now in retirement. Blood pressure checks at home have been doing well no complaints from medications which has been taken faithfully without side effects. Cholesterol doing well with medications no complaints or concerns Sinus doing okay. Relevant past medical, surgical, family and social history reviewed and updated as indicated. Interim medical history since our last visit reviewed. Allergies and medications reviewed and updated.  Review of Systems  Constitutional: Negative.   HENT: Negative.   Eyes: Negative.   Respiratory: Negative.   Cardiovascular: Negative.   Gastrointestinal: Negative.   Endocrine: Negative.   Genitourinary: Negative.   Musculoskeletal: Negative.   Skin: Negative.   Allergic/Immunologic: Negative.   Neurological: Negative.   Hematological: Negative.   Psychiatric/Behavioral: Negative.     Per HPI unless specifically indicated above     Objective:    BP (!) 156/93   Pulse 62   Temp 97.5 F (36.4 C)   Ht 5' 10.5" (1.791 m)   Wt 210 lb (95.3 kg)   SpO2 97%   BMI 29.71 kg/m   Wt Readings from Last 3 Encounters:  03/21/16 210 lb (95.3 kg)  09/14/15 213 lb (96.6 kg)  03/15/15 208 lb (94.3 kg)    Physical Exam  Constitutional: He is oriented to person, place, and time. He appears well-developed and well-nourished.  HENT:  Head: Normocephalic and atraumatic.  Right Ear: External ear normal.  Left Ear: External ear normal.  Eyes: Conjunctivae and EOM are  normal. Pupils are equal, round, and reactive to light.  Neck: Normal range of motion. Neck supple.  Cardiovascular: Normal rate, regular rhythm, normal heart sounds and intact distal pulses.   Pulmonary/Chest: Effort normal and breath sounds normal.  Abdominal: Soft. Bowel sounds are normal. There is no splenomegaly or hepatomegaly.  Genitourinary: Rectum normal, prostate normal and penis normal.  Musculoskeletal: Normal range of motion.  Neurological: He is alert and oriented to person, place, and time. He has normal reflexes.  Skin: No rash noted. No erythema.  Psychiatric: He has a normal mood and affect. His behavior is normal. Judgment and thought content normal.    Results for orders placed or performed in visit on 09/14/15  LP+ALT+AST Piccolo, [employer]  Result Value Ref Range   ALT (SGPT) Piccolo, Waived 21 10 - 47 U/L   AST (SGOT) Piccolo, Waived 27 11 - 38 U/L   Cholesterol Piccolo, Waived 189 <200 mg/dL   HDL Chol Piccolo, Waived 50 (L) >59 mg/dL   Triglycerides Piccolo,Waived 100 <150 mg/dL   Chol/HDL Ratio Piccolo,Waive 3.7 mg/dL   LDL Chol Calc Piccolo Waived 118 (H) <100 mg/dL   VLDL Chol Calc Piccolo,Waive 20 <30 mg/dL  Basic metabolic panel  Result Value Ref Range   Glucose 163 (H) 65 - 99 mg/dL   BUN 14 8 - 27 mg/dL   Creatinine, Ser 1.21 0.76 - 1.27 mg/dL   GFR calc non Af Amer 63 >59 mL/min/1.73   GFR calc Af Wyvonnia Lora  73 >59 mL/min/1.73   BUN/Creatinine Ratio 12 10 - 24   Sodium 141 134 - 144 mmol/L   Potassium 4.0 3.5 - 5.2 mmol/L   Chloride 101 96 - 106 mmol/L   CO2 20 18 - 29 mmol/L   Calcium 9.2 8.6 - 10.2 mg/dL  PSA  Result Value Ref Range   Prostate Specific Ag, Serum 4.0 0.0 - 4.0 ng/mL  Hepatitis C Antibody  Result Value Ref Range   Hep C Virus Ab <0.1 0.0 - 0.9 s/co ratio  HIV antibody  Result Value Ref Range   HIV Screen 4th Generation wRfx Non Reactive Non Reactive      Assessment & Plan:   Problem List Items Addressed This Visit       Cardiovascular and Mediastinum   Hypertension - Primary    Blood pressure poor control with poor control will increase losartan hydrochlorothiazide to 100/25 discontinue the losartan hydrochlorothiazide 100/12.5. Gave patient written directions Recheck 1 month blood pressure control also check BMP.      Relevant Medications   tadalafil (CIALIS) 20 MG tablet   atorvastatin (LIPITOR) 40 MG tablet   amLODipine (NORVASC) 10 MG tablet   losartan-hydrochlorothiazide (HYZAAR) 100-25 MG tablet   Other Relevant Orders   CBC with Differential/Platelet   Comprehensive metabolic panel   Urinalysis, Routine w reflex microscopic (not at [facility])     Other   Hyperlipidemia   Relevant Medications   tadalafil (CIALIS) 20 MG tablet   atorvastatin (LIPITOR) 40 MG tablet   amLODipine (NORVASC) 10 MG tablet   losartan-hydrochlorothiazide (HYZAAR) 100-25 MG tablet   Other Relevant Orders   Lipid Profile    Other Visit Diagnoses    Elevated PSA       Relevant Orders   Urinalysis, Routine w reflex microscopic (not at [facility])   PSA   PE (physical exam), annual       Relevant Orders   CBC with Differential/Platelet   Comprehensive metabolic panel   Urinalysis, Routine w reflex microscopic (not at [facility])   Lipid Profile   TSH   PSA       Follow up plan: Return in about 4 weeks (around 04/18/2016) for BMP blood pressure check.

## 2016-03-22 LAB — LIPID PANEL
CHOL/HDL RATIO: 3.8 ratio (ref 0.0–5.0)
Cholesterol, Total: 174 mg/dL (ref 100–199)
HDL: 46 mg/dL (ref >39)
LDL Calculated: 106 mg/dL — ABNORMAL HIGH (ref 0–99)
Triglycerides: 109 mg/dL (ref 0–149)
VLDL Cholesterol Cal: 22 mg/dL (ref 5–40)

## 2016-03-22 LAB — COMPREHENSIVE METABOLIC PANEL
A/G RATIO: 1.3 (ref 1.2–2.2)
ALBUMIN: 4.1 g/dL (ref 3.6–4.8)
ALT: 23 IU/L (ref 0–44)
AST: 23 IU/L (ref 0–40)
Alkaline Phosphatase: 62 IU/L (ref 39–117)
BILIRUBIN TOTAL: 0.5 mg/dL (ref 0.0–1.2)
BUN / CREAT RATIO: 10 (ref 10–24)
BUN: 12 mg/dL (ref 8–27)
CALCIUM: 9.3 mg/dL (ref 8.6–10.2)
CO2: 23 mmol/L (ref 18–29)
Chloride: 99 mmol/L (ref 96–106)
Creatinine, Ser: 1.23 mg/dL (ref 0.76–1.27)
GFR, EST AFRICAN AMERICAN: 71 mL/min/{1.73_m2} (ref >59)
GFR, EST NON AFRICAN AMERICAN: 62 mL/min/{1.73_m2} (ref >59)
GLOBULIN, TOTAL: 3.2 g/dL (ref 1.5–4.5)
Glucose: 141 mg/dL — ABNORMAL HIGH (ref 65–99)
POTASSIUM: 4 mmol/L (ref 3.5–5.2)
SODIUM: 139 mmol/L (ref 134–144)
TOTAL PROTEIN: 7.3 g/dL (ref 6.0–8.5)

## 2016-03-22 LAB — CBC WITH DIFFERENTIAL/PLATELET
BASOS: 0 %
Basophils Absolute: 0 10*3/uL (ref 0.0–0.2)
EOS (ABSOLUTE): 0.1 10*3/uL (ref 0.0–0.4)
EOS: 2 %
HEMATOCRIT: 48.3 % (ref 37.5–51.0)
Hemoglobin: 16.1 g/dL (ref 12.6–17.7)
IMMATURE GRANS (ABS): 0 10*3/uL (ref 0.0–0.1)
IMMATURE GRANULOCYTES: 0 %
LYMPHS: 32 %
Lymphocytes Absolute: 1.3 10*3/uL (ref 0.7–3.1)
MCH: 27.3 pg (ref 26.6–33.0)
MCHC: 33.3 g/dL (ref 31.5–35.7)
MCV: 82 fL (ref 79–97)
Monocytes Absolute: 0.4 10*3/uL (ref 0.1–0.9)
Monocytes: 8 %
NEUTROS ABS: 2.4 10*3/uL (ref 1.4–7.0)
NEUTROS PCT: 58 %
Platelets: 203 10*3/uL (ref 150–379)
RBC: 5.9 x10E6/uL — ABNORMAL HIGH (ref 4.14–5.80)
RDW: 15 % (ref 12.3–15.4)
WBC: 4.2 10*3/uL (ref 3.4–10.8)

## 2016-03-22 LAB — TSH: TSH: 2.23 u[IU]/mL (ref 0.450–4.500)

## 2016-03-22 LAB — PSA: PROSTATE SPECIFIC AG, SERUM: 17.2 ng/mL — AB (ref 0.0–4.0)

## 2016-03-23 ENCOUNTER — Telehealth: Payer: Self-pay | Admitting: Family Medicine

## 2016-03-23 DIAGNOSIS — R972 Elevated prostate specific antigen [PSA]: Secondary | ICD-10-CM

## 2016-03-23 NOTE — Telephone Encounter (Signed)
Please let him know that his labs came back normal except his prostate marker which came back pretty high. We'd like him to see the urologist and we've put in a referral (its an urgent one- please) for that appointment and they should be calling him. Everything else looked good.

## 2016-03-23 NOTE — Telephone Encounter (Signed)
Called patient, no answer, left message for patient to return my call.  

## 2016-03-26 NOTE — Telephone Encounter (Signed)
Please put referral in

## 2016-03-26 NOTE — Telephone Encounter (Signed)
It's in.  

## 2016-04-03 ENCOUNTER — Ambulatory Visit (INDEPENDENT_AMBULATORY_CARE_PROVIDER_SITE_OTHER): Payer: BLUE CROSS/BLUE SHIELD | Admitting: Urology

## 2016-04-03 ENCOUNTER — Encounter: Payer: Self-pay | Admitting: Urology

## 2016-04-03 VITALS — BP 131/88 | HR 103 | Ht 70.5 in | Wt 216.1 lb

## 2016-04-03 DIAGNOSIS — N400 Enlarged prostate without lower urinary tract symptoms: Secondary | ICD-10-CM

## 2016-04-03 DIAGNOSIS — R972 Elevated prostate specific antigen [PSA]: Secondary | ICD-10-CM

## 2016-04-03 DIAGNOSIS — N529 Male erectile dysfunction, unspecified: Secondary | ICD-10-CM
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
## 2016-04-04 LAB — PSA: PROSTATE SPECIFIC AG, SERUM: 5.5 ng/mL — AB (ref 0.0–4.0)

## 2016-04-09 ENCOUNTER — Telehealth: Payer: Self-pay

## 2016-04-09 DIAGNOSIS — R972 Elevated prostate specific antigen [PSA]: Secondary | ICD-10-CM

## 2016-04-09 NOTE — Telephone Encounter (Signed)
-----   Message from [name], MD sent at 04/05/2016  8:12 AM EST ----- PSA down significantly to 5.5 from 17.2. This is suggestive of inflammation as the cause. Overall, it still quite elevated. Like to see in 6 months with a repeat PSA to keep close track of this. Please arrange for six-month follow-up.  [name], MD

## 2016-04-09 NOTE — Telephone Encounter (Signed)
Spoke with pt in reference to PSA results. Made aware Dr. Erlene Quan would like to see him back in 43mo with a PSA prior. Pt voiced understanding. Pt requested appts be made and info mailed to him. PSA orders placed.

## 2016-04-23 ENCOUNTER — Ambulatory Visit (INDEPENDENT_AMBULATORY_CARE_PROVIDER_SITE_OTHER): Payer: BLUE CROSS/BLUE SHIELD | Admitting: Family Medicine

## 2016-04-23 ENCOUNTER — Encounter: Payer: Self-pay | Admitting: Family Medicine

## 2016-04-23 VITALS — BP 133/87 | HR 66 | Temp 98.0°F | Ht 71.0 in | Wt 214.4 lb

## 2016-04-23 DIAGNOSIS — I10 Essential (primary) hypertension: Secondary | ICD-10-CM | POA: Diagnosis not present

## 2016-04-23 DIAGNOSIS — Z23 Encounter for immunization: Secondary | ICD-10-CM | POA: Diagnosis not present

## 2016-04-23 DIAGNOSIS — R972 Elevated prostate specific antigen [PSA]: Secondary | ICD-10-CM | POA: Insufficient documentation

## 2016-04-23 DIAGNOSIS — Z2911 Encounter for prophylactic immunotherapy for respiratory syncytial virus (RSV): Secondary | ICD-10-CM

## 2016-04-23 NOTE — Assessment & Plan Note (Signed)
The current medical regimen is effective;  continue present plan and medications.  

## 2016-04-23 NOTE — Progress Notes (Signed)
   BP 133/87 (BP Location: Left Arm, Patient Position: Sitting, Cuff Size: Normal)   Pulse 66   Temp 98 F (36.7 C)   Ht 5\' 11"  (1.803 m)   Wt 214 lb 6.4 oz (97.3 kg)   BMI 29.90 kg/m    Subjective:    Patient ID: RAYANSH BOISE, male    DOB: 16-Feb-1951, 65 y.o.   MRN: [id number]  HPI: NAITHAN SANCHES is a 65 y.o. male  Chief Complaint  Patient presents with  . Follow-up  . Hypertension  Patient doing well with no concerns or problems with medications taking medications faithfully.  PSA reviewed reports from urology having good reports patient with no prostate issues. Was scheduled for 6 month follow-up PSA.    Relevant past medical, surgical, family and social history reviewed and updated as indicated. Interim medical history since our last visit reviewed. Allergies and medications reviewed and updated.  Review of Systems  Constitutional: Negative.   Respiratory: Negative.   Cardiovascular: Negative.     Per HPI unless specifically indicated above     Objective:    BP 133/87 (BP Location: Left Arm, Patient Position: Sitting, Cuff Size: Normal)   Pulse 66   Temp 98 F (36.7 C)   Ht 5\' 11"  (1.803 m)   Wt 214 lb 6.4 oz (97.3 kg)   BMI 29.90 kg/m   Wt Readings from Last 3 Encounters:  04/23/16 214 lb 6.4 oz (97.3 kg)  04/03/16 216 lb 1.6 oz (98 kg)  03/21/16 210 lb (95.3 kg)    Physical Exam  Constitutional: He is oriented to person, place, and time. He appears well-developed and well-nourished. No distress.  HENT:  Head: Normocephalic and atraumatic.  Right Ear: Hearing normal.  Left Ear: Hearing normal.  Nose: Nose normal.  Eyes: Conjunctivae and lids are normal. Right eye exhibits no discharge. Left eye exhibits no discharge. No scleral icterus.  Cardiovascular: Regular rhythm and normal heart sounds.   Pulmonary/Chest: Effort normal and breath sounds normal. No respiratory distress.  Musculoskeletal: Normal range of motion.  Neurological: He is  alert and oriented to person, place, and time.  Skin: Skin is intact. No rash noted.  Psychiatric: He has a normal mood and affect. His speech is normal and behavior is normal. Judgment and thought content normal. Cognition and memory are normal.    Results for orders placed or performed in visit on 04/03/16  PSA  Result Value Ref Range   Prostate Specific Ag, Serum 5.5 (H) 0.0 - 4.0 ng/mL      Assessment & Plan:   Problem List Items Addressed This Visit      Cardiovascular and Mediastinum   Hypertension - Primary    The current medical regimen is effective;  continue present plan and medications.       Relevant Orders   Basic metabolic panel     Other   Elevated PSA    Followed by urology and is come down dramatically has six-month follow-up with urology.       Other Visit Diagnoses    Need for shingles vaccine       Relevant Orders   Varicella-zoster vaccine subcutaneous (Completed)       Follow up plan: Return in about 5 months (around 09/21/2016) for BMP,  Lipids, ALT, AST.

## 2016-04-23 NOTE — Assessment & Plan Note (Signed)
Followed by urology and is come down dramatically has six-month follow-up with urology.

## 2016-04-24 ENCOUNTER — Encounter: Payer: Self-pay | Admitting: Family Medicine

## 2016-04-24 LAB — BASIC METABOLIC PANEL
BUN/Creatinine Ratio: 13 (ref 10–24)
BUN: 15 mg/dL (ref 8–27)
CALCIUM: 9.5 mg/dL (ref 8.6–10.2)
CHLORIDE: 99 mmol/L (ref 96–106)
CO2: 21 mmol/L (ref 18–29)
Creatinine, Ser: 1.17 mg/dL (ref 0.76–1.27)
GFR, EST AFRICAN AMERICAN: 76 mL/min/{1.73_m2} (ref >59)
GFR, EST NON AFRICAN AMERICAN: 65 mL/min/{1.73_m2} (ref >59)
Glucose: 164 mg/dL — ABNORMAL HIGH (ref 65–99)
POTASSIUM: 4.1 mmol/L (ref 3.5–5.2)
Sodium: 138 mmol/L (ref 134–144)

## 2016-09-20 ENCOUNTER — Encounter: Payer: Self-pay | Admitting: Family Medicine

## 2016-09-20 ENCOUNTER — Ambulatory Visit (INDEPENDENT_AMBULATORY_CARE_PROVIDER_SITE_OTHER): Payer: BLUE CROSS/BLUE SHIELD | Admitting: Family Medicine

## 2016-09-20 VITALS — BP 170/106 | HR 67 | Ht 71.0 in | Wt 213.0 lb

## 2016-09-20 DIAGNOSIS — E782 Mixed hyperlipidemia: Secondary | ICD-10-CM

## 2016-09-20 DIAGNOSIS — I10 Essential (primary) hypertension: Secondary | ICD-10-CM | POA: Diagnosis not present

## 2016-09-20 DIAGNOSIS — J3089 Other allergic rhinitis: Secondary | ICD-10-CM

## 2016-09-20 LAB — LP+ALT+AST PICCOLO, WAIVED
ALT (SGPT) Piccolo, Waived: 18 U/L (ref 10–47)
AST (SGOT) Piccolo, Waived: 29 U/L (ref 11–38)
CHOLESTEROL PICCOLO, WAIVED: 206 mg/dL — AB (ref <200)
Chol/HDL Ratio Piccolo,Waive: 3.5 mg/dL
HDL Chol Piccolo, Waived: 58 mg/dL (ref >59)
LDL CHOL CALC PICCOLO WAIVED: 123 mg/dL — AB (ref <100)
Triglycerides Piccolo,Waived: 122 mg/dL (ref <150)
VLDL CHOL CALC PICCOLO,WAIVE: 24 mg/dL (ref <30)

## 2016-09-20 MED ORDER — AMLODIPINE BESYLATE 10 MG PO TABS: 10.0000 mg | ORAL_TABLET | Freq: Every day | ORAL | 3 refills | Status: DC

## 2016-09-20 NOTE — Assessment & Plan Note (Signed)
Poor control off amlodipine patient will restart amlodipine reviewed medications with patient to eliminate confusion. Blood pressure also aggravated by over-the-counter medications for allergies reviewed acceptable allergy medications

## 2016-09-20 NOTE — Progress Notes (Signed)
BP (!) 170/106   Pulse 67   Ht 5\' 11"  (1.803 m)   Wt 213 lb (96.6 kg)   SpO2 97%   BMI 29.71 kg/m    Subjective:    Patient ID: Donald Le, male    DOB: 1951-04-05, 66 y.o.   MRN: [id number]  HPI: Donald Le is a 66 y.o. male  Chief Complaint  Patient presents with  . Follow-up  . Hypertension  . Cough  Patient with bad allergies been taking some over-the-counter cold and flu medications. On extensive review patient not taking amlodipine for blood pressure Patient taking other medications without problems or issues.   Relevant past medical, surgical, family and social history reviewed and updated as indicated. Interim medical history since our last visit reviewed. Allergies and medications reviewed and updated.  Review of Systems  Constitutional: Negative for chills, diaphoresis, fatigue and fever.  HENT: Positive for congestion, postnasal drip, rhinorrhea, sinus pressure and sneezing. Negative for sinus pain and sore throat.   Respiratory: Positive for cough.   Cardiovascular: Negative.     Per HPI unless specifically indicated above     Objective:    BP (!) 170/106   Pulse 67   Ht 5\' 11"  (1.803 m)   Wt 213 lb (96.6 kg)   SpO2 97%   BMI 29.71 kg/m   Wt Readings from Last 3 Encounters:  09/20/16 213 lb (96.6 kg)  04/23/16 214 lb 6.4 oz (97.3 kg)  04/03/16 216 lb 1.6 oz (98 kg)    Physical Exam  Constitutional: He is oriented to person, place, and time. He appears well-developed and well-nourished.  HENT:  Head: Normocephalic and atraumatic.  Right Ear: External ear normal.  Left Ear: External ear normal.  Mouth/Throat: Oropharynx is clear and moist.  Eyes: Conjunctivae and EOM are normal.  Neck: Normal range of motion.  Cardiovascular: Normal rate, regular rhythm and normal heart sounds.   Pulmonary/Chest: Effort normal and breath sounds normal.  Musculoskeletal: Normal range of motion.  Neurological: He is alert and oriented to  person, place, and time.  Skin: No erythema.  Psychiatric: He has a normal mood and affect. His behavior is normal. Judgment and thought content normal.    Results for orders placed or performed in visit on 37/62/83  Basic metabolic panel  Result Value Ref Range   Glucose 164 (H) 65 - 99 mg/dL   BUN 15 8 - 27 mg/dL   Creatinine, Ser 1.17 0.76 - 1.27 mg/dL   GFR calc non Af Amer 65 >59 mL/min/1.73   GFR calc Af Amer 76 >59 mL/min/1.73   BUN/Creatinine Ratio 13 10 - 24   Sodium 138 134 - 144 mmol/L   Potassium 4.1 3.5 - 5.2 mmol/L   Chloride 99 96 - 106 mmol/L   CO2 21 18 - 29 mmol/L   Calcium 9.5 8.6 - 10.2 mg/dL      Assessment & Plan:   Problem List Items Addressed This Visit      Cardiovascular and Mediastinum   Hypertension - Primary    Poor control off amlodipine patient will restart amlodipine reviewed medications with patient to eliminate confusion. Blood pressure also aggravated by over-the-counter medications for allergies reviewed acceptable allergy medications      Relevant Medications   amLODipine (NORVASC) 10 MG tablet   Other Relevant Orders   Basic metabolic panel   LP+ALT+AST Piccolo, Waived     Other   Hyperlipidemia    Results pending no issues with  medications      Relevant Medications   amLODipine (NORVASC) 10 MG tablet   Other Relevant Orders   Basic metabolic panel   LP+ALT+AST Piccolo, Waived   Environmental and seasonal allergies    Discussed safe allergy care and treatment nasal rinse Allegra and Claritin Nasacort or Flonase gave samples.          Follow up plan: Return in about 4 weeks (around 10/18/2016) for BMP.

## 2016-09-20 NOTE — Assessment & Plan Note (Signed)
Discussed safe allergy care and treatment nasal rinse Allegra and Claritin Nasacort or Flonase gave samples.

## 2016-09-20 NOTE — Assessment & Plan Note (Signed)
Results pending no issues with medications

## 2016-09-21 LAB — BASIC METABOLIC PANEL
BUN / CREAT RATIO: 12 (ref 10–24)
BUN: 15 mg/dL (ref 8–27)
CALCIUM: 9.7 mg/dL (ref 8.6–10.2)
CHLORIDE: 97 mmol/L (ref 96–106)
CO2: 22 mmol/L (ref 18–29)
Creatinine, Ser: 1.27 mg/dL (ref 0.76–1.27)
GFR calc non Af Amer: 59 mL/min/{1.73_m2} — ABNORMAL LOW (ref >59)
GFR, EST AFRICAN AMERICAN: 69 mL/min/{1.73_m2} (ref >59)
Glucose: 184 mg/dL — ABNORMAL HIGH (ref 65–99)
Potassium: 4.1 mmol/L (ref 3.5–5.2)
Sodium: 136 mmol/L (ref 134–144)

## 2016-09-24 ENCOUNTER — Encounter: Payer: Self-pay | Admitting: Family Medicine

## 2016-10-05 ENCOUNTER — Other Ambulatory Visit: Payer: BLUE CROSS/BLUE SHIELD

## 2016-10-10 ENCOUNTER — Ambulatory Visit: Payer: BLUE CROSS/BLUE SHIELD | Admitting: Urology

## 2017-05-09 ENCOUNTER — Ambulatory Visit (INDEPENDENT_AMBULATORY_CARE_PROVIDER_SITE_OTHER): Payer: Medicare Other | Admitting: Family Medicine

## 2017-05-09 ENCOUNTER — Encounter: Payer: Self-pay | Admitting: Family Medicine

## 2017-05-09 VITALS — BP 133/81 | HR 77 | Temp 98.2°F | Wt 212.0 lb

## 2017-05-09 DIAGNOSIS — J019 Acute sinusitis, unspecified: Secondary | ICD-10-CM

## 2017-05-09 MED ORDER — AMOXICILLIN-POT CLAVULANATE 875-125 MG PO TABS: 1.0000 | ORAL_TABLET | Freq: Two times a day (BID) | ORAL | 0 refills | Status: DC

## 2017-05-09 NOTE — Progress Notes (Signed)
BP 133/81   Pulse 77   Temp 98.2 F (36.8 C) (Oral)   Wt 212 lb (96.2 kg)   SpO2 99%   BMI 29.57 kg/m    Subjective:    Patient ID: Donald Le, male    DOB: 1951-01-09, 66 y.o.   MRN: [id number]  HPI: Donald Le is a 66 y.o. male  Chief Complaint  Patient presents with  . Sinusitis   Patient 2 weeks right facial pain discomfort sloshing-type sensation low-grade fever aches and pains and feeling bad.. No other systemic symptoms has been using over-the-counter medications without relief. Relevant past medical, surgical, family and social history reviewed and updated as indicated. Interim medical history since our last visit reviewed. Allergies and medications reviewed and updated.  Review of Systems  Constitutional: Positive for chills, diaphoresis, fatigue and fever.  HENT: Positive for congestion, nosebleeds, postnasal drip, rhinorrhea, sinus pressure, sinus pain and sore throat.   Respiratory: Positive for cough.   Cardiovascular: Negative.     Per HPI unless specifically indicated above     Objective:    BP 133/81   Pulse 77   Temp 98.2 F (36.8 C) (Oral)   Wt 212 lb (96.2 kg)   SpO2 99%   BMI 29.57 kg/m   Wt Readings from Last 3 Encounters:  05/09/17 212 lb (96.2 kg)  09/20/16 213 lb (96.6 kg)  04/23/16 214 lb 6.4 oz (97.3 kg)    Physical Exam  Constitutional: He is oriented to person, place, and time. He appears well-developed and well-nourished.  HENT:  Head: Normocephalic and atraumatic.  Right Ear: External ear normal.  Left Ear: External ear normal.  Mouth/Throat: Oropharyngeal exudate present.  Eyes: Conjunctivae and EOM are normal.  Neck: Normal range of motion.  Cardiovascular: Normal rate, regular rhythm and normal heart sounds.  Pulmonary/Chest: Effort normal and breath sounds normal.  Musculoskeletal: Normal range of motion.  Neurological: He is alert and oriented to person, place, and time.  Skin: No erythema.    Psychiatric: He has a normal mood and affect. His behavior is normal. Judgment and thought content normal.    Results for orders placed or performed in visit on 96/29/52  Basic metabolic panel  Result Value Ref Range   Glucose 184 (H) 65 - 99 mg/dL   BUN 15 8 - 27 mg/dL   Creatinine, Ser 1.27 0.76 - 1.27 mg/dL   GFR calc non Af Amer 59 (L) >59 mL/min/1.73   GFR calc Af Amer 69 >59 mL/min/1.73   BUN/Creatinine Ratio 12 10 - 24   Sodium 136 134 - 144 mmol/L   Potassium 4.1 3.5 - 5.2 mmol/L   Chloride 97 96 - 106 mmol/L   CO2 22 18 - 29 mmol/L   Calcium 9.7 8.6 - 10.2 mg/dL  LP+ALT+AST Piccolo, Waived  Result Value Ref Range   ALT (SGPT) Piccolo, Waived 18 10 - 47 U/L   AST (SGOT) Piccolo, Waived 29 11 - 38 U/L   Cholesterol Piccolo, Waived 206 (H) <200 mg/dL   HDL Chol Piccolo, Waived 58 >59 mg/dL   Triglycerides Piccolo,Waived 122 <150 mg/dL   Chol/HDL Ratio Piccolo,Waive 3.5 mg/dL   LDL Chol Calc Piccolo Waived 123 (H) <100 mg/dL   VLDL Chol Calc Piccolo,Waive 24 <30 mg/dL      Assessment & Plan:   Problem List Items Addressed This Visit    None    Visit Diagnoses    Acute sinusitis, recurrence not specified, unspecified location    -  Primary   Relevant Medications   amoxicillin-clavulanate (AUGMENTIN) 875-125 MG tablet     discussed sinusitis care and treatment Mucinex Tylenol sinus nasal rinse use of antibiotics avoiding side effects  Follow up plan: Return if symptoms worsen or fail to improve, for As scheduled.

## 2017-05-27 ENCOUNTER — Other Ambulatory Visit: Payer: Self-pay | Admitting: Family Medicine

## 2017-05-27 DIAGNOSIS — I10 Essential (primary) hypertension: Secondary | ICD-10-CM

## 2017-06-19 ENCOUNTER — Encounter: Payer: Self-pay | Admitting: Family Medicine

## 2017-06-19 ENCOUNTER — Ambulatory Visit (INDEPENDENT_AMBULATORY_CARE_PROVIDER_SITE_OTHER): Payer: Medicare HMO | Admitting: Family Medicine

## 2017-06-19 VITALS — BP 146/101 | HR 69 | Ht 71.0 in | Wt 214.0 lb

## 2017-06-19 DIAGNOSIS — E782 Mixed hyperlipidemia: Secondary | ICD-10-CM

## 2017-06-19 DIAGNOSIS — Z0000 Encounter for general adult medical examination without abnormal findings: Secondary | ICD-10-CM

## 2017-06-19 DIAGNOSIS — J302 Other seasonal allergic rhinitis: Secondary | ICD-10-CM | POA: Diagnosis not present

## 2017-06-19 DIAGNOSIS — R972 Elevated prostate specific antigen [PSA]: Secondary | ICD-10-CM

## 2017-06-19 DIAGNOSIS — Z1329 Encounter for screening for other suspected endocrine disorder: Secondary | ICD-10-CM

## 2017-06-19 DIAGNOSIS — N400 Enlarged prostate without lower urinary tract symptoms: Secondary | ICD-10-CM | POA: Diagnosis not present

## 2017-06-19 DIAGNOSIS — I10 Essential (primary) hypertension: Secondary | ICD-10-CM | POA: Diagnosis not present

## 2017-06-19 DIAGNOSIS — Z7189 Other specified counseling: Secondary | ICD-10-CM | POA: Insufficient documentation

## 2017-06-19 LAB — URINALYSIS, ROUTINE W REFLEX MICROSCOPIC
Bilirubin, UA: NEGATIVE
GLUCOSE, UA: NEGATIVE
KETONES UA: NEGATIVE
LEUKOCYTES UA: NEGATIVE
NITRITE UA: NEGATIVE
Protein, UA: NEGATIVE
RBC UA: NEGATIVE
SPEC GRAV UA: 1.015 (ref 1.005–1.030)
UUROB: 0.2 mg/dL (ref 0.2–1.0)
pH, UA: 6.5 (ref 5.0–7.5)

## 2017-06-19 MED ORDER — LOSARTAN POTASSIUM-HCTZ 100-25 MG PO TABS: 1.0000 | ORAL_TABLET | Freq: Every day | ORAL | 4 refills | Status: DC

## 2017-06-19 MED ORDER — ATORVASTATIN CALCIUM 40 MG PO TABS: 40.0000 mg | ORAL_TABLET | Freq: Every day | ORAL | 4 refills | Status: DC

## 2017-06-19 MED ORDER — AZELASTINE HCL 137 MCG/SPRAY NA SOLN: 2.0000 | Freq: Every day | NASAL | 12 refills | Status: DC

## 2017-06-19 MED ORDER — FEXOFENADINE HCL 180 MG PO TABS: 180.0000 mg | ORAL_TABLET | Freq: Every day | ORAL | 12 refills | Status: DC

## 2017-06-19 MED ORDER — FLUTICASONE PROPIONATE 50 MCG/ACT NA SUSP: 2.0000 | Freq: Every day | NASAL | 12 refills | Status: DC

## 2017-06-19 MED ORDER — AMLODIPINE BESYLATE 10 MG PO TABS: 10.0000 mg | ORAL_TABLET | Freq: Every day | ORAL | 3 refills | Status: DC

## 2017-06-19 NOTE — Assessment & Plan Note (Signed)
Discussed allergy care and treatment will also consider antihistamine nose sprays and Flonase.

## 2017-06-19 NOTE — Assessment & Plan Note (Signed)
stable °

## 2017-06-19 NOTE — Assessment & Plan Note (Signed)
Discussed hypertension blood pressure elevated today but most likely secondary to medication will change allergy medicines to Allegra or Claritin.

## 2017-06-19 NOTE — Assessment & Plan Note (Signed)
A voluntary discussion about advance care planning including the explanation and discussion of advance directives was extensively discussed  with the patient.  Explanation about the health care proxy and Living will was reviewed and packet with forms with explanation of how to fill them out was given.    

## 2017-06-19 NOTE — Progress Notes (Signed)
BP (!) 146/101   Pulse 69   Ht 5\' 11"  (1.803 m)   Wt 214 lb (97.1 kg)   SpO2 98%   BMI 29.85 kg/m    Subjective:    Patient ID: Donald Le, male    DOB: 04-Mar-1951, 67 y.o.   MRN: [id number]  HPI: Donald Le is a 67 y.o. male  Chief Complaint  Patient presents with  . Annual Exam  She is all in all doing well takes atorvastatin for cholesterol without problems Blood pressure medicines all in all doing well except for his had some allergy head cold type symptoms taking some phenylephrine 5 mg and notices it can make his blood pressure go up.  Relevant past medical, surgical, family and social history reviewed and updated as indicated. Interim medical history since our last visit reviewed. Allergies and medications reviewed and updated.  Review of Systems  Constitutional: Negative.   HENT: Negative.   Eyes: Negative.   Respiratory: Negative.   Cardiovascular: Negative.   Gastrointestinal: Negative.   Endocrine: Negative.   Genitourinary: Negative.   Musculoskeletal: Negative.   Skin: Negative.   Allergic/Immunologic: Negative.   Neurological: Negative.   Hematological: Negative.   Psychiatric/Behavioral: Negative.     Per HPI unless specifically indicated above     Objective:    BP (!) 146/101   Pulse 69   Ht 5\' 11"  (1.803 m)   Wt 214 lb (97.1 kg)   SpO2 98%   BMI 29.85 kg/m   Wt Readings from Last 3 Encounters:  06/19/17 214 lb (97.1 kg)  05/09/17 212 lb (96.2 kg)  09/20/16 213 lb (96.6 kg)    Physical Exam  Constitutional: He is oriented to person, place, and time. He appears well-developed and well-nourished.  HENT:  Head: Normocephalic and atraumatic.  Right Ear: External ear normal.  Left Ear: External ear normal.  Eyes: Conjunctivae and EOM are normal. Pupils are equal, round, and reactive to light.  Neck: Normal range of motion. Neck supple.  Cardiovascular: Normal rate, regular rhythm, normal heart sounds and intact distal  pulses.  Pulmonary/Chest: Effort normal and breath sounds normal.  Abdominal: Soft. Bowel sounds are normal. There is no splenomegaly or hepatomegaly.  Genitourinary: Rectum normal, prostate normal and penis normal.  Musculoskeletal: Normal range of motion.  Neurological: He is alert and oriented to person, place, and time. He has normal reflexes.  Skin: No rash noted. No erythema.  Psychiatric: He has a normal mood and affect. His behavior is normal. Judgment and thought content normal.    Results for orders placed or performed in visit on 02/40/97  Basic metabolic panel  Result Value Ref Range   Glucose 184 (H) 65 - 99 mg/dL   BUN 15 8 - 27 mg/dL   Creatinine, Ser 1.27 0.76 - 1.27 mg/dL   GFR calc non Af Amer 59 (L) >59 mL/min/1.73   GFR calc Af Amer 69 >59 mL/min/1.73   BUN/Creatinine Ratio 12 10 - 24   Sodium 136 134 - 144 mmol/L   Potassium 4.1 3.5 - 5.2 mmol/L   Chloride 97 96 - 106 mmol/L   CO2 22 18 - 29 mmol/L   Calcium 9.7 8.6 - 10.2 mg/dL  LP+ALT+AST Piccolo, Waived  Result Value Ref Range   ALT (SGPT) Piccolo, Waived 18 10 - 47 U/L   AST (SGOT) Piccolo, Waived 29 11 - 38 U/L   Cholesterol Piccolo, Waived 206 (H) <200 mg/dL   HDL Chol Piccolo, Waived 58 >  59 mg/dL   Triglycerides Piccolo,Waived 122 <150 mg/dL   Chol/HDL Ratio Piccolo,Waive 3.5 mg/dL   LDL Chol Calc Piccolo Waived 123 (H) <100 mg/dL   VLDL Chol Calc Piccolo,Waive 24 <30 mg/dL      Assessment & Plan:   Problem List Items Addressed This Visit      Cardiovascular and Mediastinum   Hypertension - Primary    Discussed hypertension blood pressure elevated today but most likely secondary to medication will change allergy medicines to Allegra or Claritin.      Relevant Medications   amLODipine (NORVASC) 10 MG tablet   atorvastatin (LIPITOR) 40 MG tablet   losartan-hydrochlorothiazide (HYZAAR) 100-25 MG tablet   Other Relevant Orders   CBC with Differential/Platelet   Comprehensive metabolic panel     Urinalysis, Routine w reflex microscopic     Respiratory   Allergic rhinitis, seasonal    Discussed allergy care and treatment will also consider antihistamine nose sprays and Flonase.      Relevant Medications   fexofenadine (ALLEGRA) 180 MG tablet   fluticasone (FLONASE) 50 MCG/ACT nasal spray   Azelastine HCl 137 MCG/SPRAY SOLN     Genitourinary   BPH (benign prostatic hyperplasia)    stable        Other   Hyperlipidemia    The current medical regimen is effective;  continue present plan and medications.       Relevant Medications   amLODipine (NORVASC) 10 MG tablet   atorvastatin (LIPITOR) 40 MG tablet   losartan-hydrochlorothiazide (HYZAAR) 100-25 MG tablet   Other Relevant Orders   CBC with Differential/Platelet   Comprehensive metabolic panel   Lipid panel   Urinalysis, Routine w reflex microscopic   Elevated PSA   Relevant Orders   PSA   Advanced care planning/counseling discussion    A voluntary discussion about advance care planning including the explanation and discussion of advance directives was extensively discussed  with the patient.  Explanation about the health care proxy and Living will was reviewed and packet with forms with explanation of how to fill them out was given.         Other Visit Diagnoses    Thyroid disorder screen       Relevant Orders   TSH   PE (physical exam), annual           Follow up plan: Return in about 6 months (around 12/17/2017) for BMP,  Lipids, ALT, AST.

## 2017-06-19 NOTE — Assessment & Plan Note (Signed)
The current medical regimen is effective;  continue present plan and medications.  

## 2017-06-20 ENCOUNTER — Telehealth: Payer: Self-pay | Admitting: Family Medicine

## 2017-06-20 DIAGNOSIS — R972 Elevated prostate specific antigen [PSA]: Secondary | ICD-10-CM

## 2017-06-20 LAB — CBC WITH DIFFERENTIAL/PLATELET
BASOS ABS: 0 10*3/uL (ref 0.0–0.2)
Basos: 1 %
EOS (ABSOLUTE): 0.1 10*3/uL (ref 0.0–0.4)
Eos: 3 %
Hematocrit: 45.5 % (ref 37.5–51.0)
Hemoglobin: 15 g/dL (ref 13.0–17.7)
IMMATURE GRANULOCYTES: 0 %
Immature Grans (Abs): 0 10*3/uL (ref 0.0–0.1)
LYMPHS ABS: 1.5 10*3/uL (ref 0.7–3.1)
Lymphs: 31 %
MCH: 26.4 pg — ABNORMAL LOW (ref 26.6–33.0)
MCHC: 33 g/dL (ref 31.5–35.7)
MCV: 80 fL (ref 79–97)
MONOCYTES: 11 %
Monocytes Absolute: 0.5 10*3/uL (ref 0.1–0.9)
NEUTROS PCT: 54 %
Neutrophils Absolute: 2.6 10*3/uL (ref 1.4–7.0)
Platelets: 269 10*3/uL (ref 150–379)
RBC: 5.68 x10E6/uL (ref 4.14–5.80)
RDW: 16.6 % — AB (ref 12.3–15.4)
WBC: 4.8 10*3/uL (ref 3.4–10.8)

## 2017-06-20 LAB — LIPID PANEL
CHOLESTEROL TOTAL: 172 mg/dL (ref 100–199)
Chol/HDL Ratio: 3.7 ratio (ref 0.0–5.0)
HDL: 47 mg/dL (ref >39)
LDL CALC: 106 mg/dL — AB (ref 0–99)
TRIGLYCERIDES: 96 mg/dL (ref 0–149)
VLDL CHOLESTEROL CAL: 19 mg/dL (ref 5–40)

## 2017-06-20 LAB — COMPREHENSIVE METABOLIC PANEL
ALK PHOS: 66 IU/L (ref 39–117)
ALT: 24 IU/L (ref 0–44)
AST: 22 IU/L (ref 0–40)
Albumin/Globulin Ratio: 1.3 (ref 1.2–2.2)
Albumin: 4.3 g/dL (ref 3.6–4.8)
BUN/Creatinine Ratio: 9 — ABNORMAL LOW (ref 10–24)
BUN: 11 mg/dL (ref 8–27)
Bilirubin Total: 0.4 mg/dL (ref 0.0–1.2)
CALCIUM: 9.7 mg/dL (ref 8.6–10.2)
CO2: 23 mmol/L (ref 20–29)
CREATININE: 1.17 mg/dL (ref 0.76–1.27)
Chloride: 97 mmol/L (ref 96–106)
GFR calc Af Amer: 75 mL/min/{1.73_m2} (ref >59)
GFR calc non Af Amer: 65 mL/min/{1.73_m2} (ref >59)
GLUCOSE: 149 mg/dL — AB (ref 65–99)
Globulin, Total: 3.3 g/dL (ref 1.5–4.5)
Potassium: 4.2 mmol/L (ref 3.5–5.2)
Sodium: 138 mmol/L (ref 134–144)
Total Protein: 7.6 g/dL (ref 6.0–8.5)

## 2017-06-20 LAB — TSH: TSH: 3.13 u[IU]/mL (ref 0.450–4.500)

## 2017-06-20 LAB — PSA: Prostate Specific Ag, Serum: 6.2 ng/mL — ABNORMAL HIGH (ref 0.0–4.0)

## 2017-06-20 NOTE — Telephone Encounter (Signed)
Phone call Discussed with patient elevated PSA will recheck 2 months.

## 2018-04-23 ENCOUNTER — Telehealth: Payer: Self-pay | Admitting: Family Medicine

## 2018-04-23 MED ORDER — LOSARTAN POTASSIUM 100 MG PO TABS: 100.0000 mg | ORAL_TABLET | Freq: Every day | ORAL | 3 refills | Status: DC

## 2018-04-23 MED ORDER — HYDROCHLOROTHIAZIDE 25 MG PO TABS: 25.0000 mg | ORAL_TABLET | Freq: Every day | ORAL | 3 refills | Status: DC

## 2018-04-23 NOTE — Telephone Encounter (Signed)
Copied from [place] [id number]. Topic: Quick Communication - See Telephone Encounter >> Apr 23, 2018  8:18 AM Ivar Drape wrote: CRM for notification. See Telephone encounter for: 04/23/18. Patient would like a refill on his losartan-hydrochlorothiazide (HYZAAR) 100-25 MG tablet medication, but his preferred pharmacy [facility] in [facility] stated they cannot get this medication in the 100-25 mg. The pharmacy suggested getting a new prescription with the Losartan and the Hydrochlorothiazide separated. Please advise.

## 2018-04-23 NOTE — Telephone Encounter (Signed)
See note below to call in separate prescriptions / Routing to office

## 2018-06-26 ENCOUNTER — Encounter: Payer: Self-pay | Admitting: Family Medicine

## 2018-06-26 ENCOUNTER — Ambulatory Visit (INDEPENDENT_AMBULATORY_CARE_PROVIDER_SITE_OTHER): Payer: Medicare HMO | Admitting: Family Medicine

## 2018-06-26 ENCOUNTER — Other Ambulatory Visit: Payer: Self-pay

## 2018-06-26 VITALS — BP 136/86 | HR 68 | Temp 97.5°F | Ht 68.5 in | Wt 210.0 lb

## 2018-06-26 DIAGNOSIS — E782 Mixed hyperlipidemia: Secondary | ICD-10-CM

## 2018-06-26 DIAGNOSIS — Z7189 Other specified counseling: Secondary | ICD-10-CM

## 2018-06-26 DIAGNOSIS — Z23 Encounter for immunization: Secondary | ICD-10-CM | POA: Diagnosis not present

## 2018-06-26 DIAGNOSIS — R972 Elevated prostate specific antigen [PSA]: Secondary | ICD-10-CM

## 2018-06-26 DIAGNOSIS — I10 Essential (primary) hypertension: Secondary | ICD-10-CM | POA: Diagnosis not present

## 2018-06-26 DIAGNOSIS — N400 Enlarged prostate without lower urinary tract symptoms: Secondary | ICD-10-CM

## 2018-06-26 DIAGNOSIS — Z1329 Encounter for screening for other suspected endocrine disorder: Secondary | ICD-10-CM | POA: Diagnosis not present

## 2018-06-26 LAB — URINALYSIS, ROUTINE W REFLEX MICROSCOPIC
Bilirubin, UA: NEGATIVE
Glucose, UA: NEGATIVE
Ketones, UA: NEGATIVE
Leukocytes, UA: NEGATIVE
NITRITE UA: NEGATIVE
Protein, UA: NEGATIVE
RBC UA: NEGATIVE
Specific Gravity, UA: 1.02 (ref 1.005–1.030)
Urobilinogen, Ur: 1 mg/dL (ref 0.2–1.0)
pH, UA: 7 (ref 5.0–7.5)

## 2018-06-26 MED ORDER — HYDROCHLOROTHIAZIDE 25 MG PO TABS: 25.0000 mg | ORAL_TABLET | Freq: Every day | ORAL | 4 refills | Status: DC

## 2018-06-26 MED ORDER — AMLODIPINE BESYLATE 10 MG PO TABS: 10.0000 mg | ORAL_TABLET | Freq: Every day | ORAL | 4 refills | Status: DC

## 2018-06-26 MED ORDER — ATORVASTATIN CALCIUM 40 MG PO TABS: 40.0000 mg | ORAL_TABLET | Freq: Every day | ORAL | 4 refills | Status: DC

## 2018-06-26 MED ORDER — LOSARTAN POTASSIUM 100 MG PO TABS: 100.0000 mg | ORAL_TABLET | Freq: Every day | ORAL | 4 refills | Status: DC

## 2018-06-26 NOTE — Progress Notes (Signed)
BP 136/86 (BP Location: Left Arm)   Pulse 68   Temp (!) 97.5 F (36.4 C) (Oral)   Ht 5' 8.5" (1.74 m)   Wt 210 lb (95.3 kg)   SpO2 98%   BMI 31.46 kg/m    Subjective:    Patient ID: Donald Le, male    DOB: 11-05-1950, 68 y.o.   MRN: [id number]  HPI: Donald Le is a 68 y.o. male  Chief Complaint  Patient presents with  . Annual Exam    been having lots of sinuses, been taking otc medications  Patient follow-up blood pressure doing well taking medications without problems and good control. Taking cholesterol medicines again without problems or issues. Allergies no allergy further symptoms not taking allergy medicine still has some occasional sinus drainage.  Relevant past medical, surgical, family and social history reviewed and updated as indicated. Interim medical history since our last visit reviewed. Allergies and medications reviewed and updated.  Review of Systems  Constitutional: Negative.   HENT: Negative.   Eyes: Negative.   Respiratory: Negative.   Cardiovascular: Negative.   Gastrointestinal: Negative.   Endocrine: Negative.   Genitourinary: Negative.   Musculoskeletal: Negative.   Skin: Negative.   Allergic/Immunologic: Negative.   Neurological: Negative.   Hematological: Negative.   Psychiatric/Behavioral: Negative.     Per HPI unless specifically indicated above     Objective:    BP 136/86 (BP Location: Left Arm)   Pulse 68   Temp (!) 97.5 F (36.4 C) (Oral)   Ht 5' 8.5" (1.74 m)   Wt 210 lb (95.3 kg)   SpO2 98%   BMI 31.46 kg/m   Wt Readings from Last 3 Encounters:  06/26/18 210 lb (95.3 kg)  06/19/17 214 lb (97.1 kg)  05/09/17 212 lb (96.2 kg)    Physical Exam Constitutional:      Appearance: He is well-developed.  HENT:     Head: Normocephalic and atraumatic.     Right Ear: External ear normal.     Left Ear: External ear normal.  Eyes:     Conjunctiva/sclera: Conjunctivae normal.     Pupils: Pupils are equal,  round, and reactive to light.  Neck:     Musculoskeletal: Normal range of motion and neck supple.  Cardiovascular:     Rate and Rhythm: Normal rate and regular rhythm.     Heart sounds: Normal heart sounds.  Pulmonary:     Effort: Pulmonary effort is normal.     Breath sounds: Normal breath sounds.  Abdominal:     General: Bowel sounds are normal.     Palpations: Abdomen is soft. There is no hepatomegaly or splenomegaly.  Genitourinary:    Penis: Normal.      Rectum: Normal.     Comments: BPH changes Small ingunial hernia lt Musculoskeletal: Normal range of motion.  Skin:    Findings: No erythema or rash.  Neurological:     Mental Status: He is alert and oriented to person, place, and time.     Deep Tendon Reflexes: Reflexes are normal and symmetric.  Psychiatric:        Behavior: Behavior normal.        Thought Content: Thought content normal.        Judgment: Judgment normal.     Results for orders placed or performed in visit on 06/26/18  Urinalysis, Routine w reflex microscopic  Result Value Ref Range   Specific Gravity, UA 1.020 1.005 - 1.030   pH, UA  7.0 5.0 - 7.5   Color, UA Yellow Yellow   Appearance Ur Clear Clear   Leukocytes, UA Negative Negative   Protein, UA Negative Negative/Trace   Glucose, UA Negative Negative   Ketones, UA Negative Negative   RBC, UA Negative Negative   Bilirubin, UA Negative Negative   Urobilinogen, Ur 1.0 0.2 - 1.0 mg/dL   Nitrite, UA Negative Negative      Assessment & Plan:   Problem List Items Addressed This Visit      Cardiovascular and Mediastinum   Hypertension    The current medical regimen is effective;  continue present plan and medications.       Relevant Medications   amLODipine (NORVASC) 10 MG tablet   losartan (COZAAR) 100 MG tablet   hydrochlorothiazide (HYDRODIURIL) 25 MG tablet   atorvastatin (LIPITOR) 40 MG tablet   Other Relevant Orders   Urinalysis, Routine w reflex microscopic (Completed)    Comprehensive metabolic panel   CBC with Differential/Platelet     Genitourinary   BPH (benign prostatic hyperplasia)    Stable         Other   Hyperlipidemia    The current medical regimen is effective;  continue present plan and medications.       Relevant Medications   amLODipine (NORVASC) 10 MG tablet   losartan (COZAAR) 100 MG tablet   hydrochlorothiazide (HYDRODIURIL) 25 MG tablet   atorvastatin (LIPITOR) 40 MG tablet   Other Relevant Orders   Lipid Panel w/o Chol/HDL Ratio   Elevated PSA   Relevant Orders   PSA   Advanced care planning/counseling discussion    A voluntary discussion about advanced care planning including explanation and discussion of advanced directives was extentively discussed with the patient.  Explained about the healthcare proxy and living will was reviewed and packet with forms with expiration of how to fill them out was given.  Time spent: Encounter 16+ min individuals present: Patient       Other Visit Diagnoses    Flu vaccine need    -  Primary   Relevant Orders   Flu vaccine HIGH DOSE PF   Need for pneumococcal vaccine       Relevant Orders   Pneumococcal conjugate vaccine 13-valent IM   Thyroid disorder screen       Relevant Orders   TSH       Follow up plan: Return in about 6 months (around 12/25/2018) for BMP,  Lipids, ALT, AST.

## 2018-06-26 NOTE — Assessment & Plan Note (Signed)
A voluntary discussion about advanced care planning including explanation and discussion of advanced directives was extentively discussed with the patient.  Explained about the healthcare proxy and living will was reviewed and packet with forms with expiration of how to fill them out was given.  Time spent: Encounter 16+ min individuals present: Patient 

## 2018-06-26 NOTE — Assessment & Plan Note (Signed)
The current medical regimen is effective;  continue present plan and medications.  

## 2018-06-26 NOTE — Assessment & Plan Note (Signed)
Stable

## 2018-06-27 LAB — LIPID PANEL W/O CHOL/HDL RATIO
Cholesterol, Total: 166 mg/dL (ref 100–199)
HDL: 48 mg/dL (ref >39)
LDL Calculated: 83 mg/dL (ref 0–99)
Triglycerides: 175 mg/dL — ABNORMAL HIGH (ref 0–149)
VLDL Cholesterol Cal: 35 mg/dL (ref 5–40)

## 2018-06-27 LAB — COMPREHENSIVE METABOLIC PANEL
ALK PHOS: 59 IU/L (ref 39–117)
ALT: 28 IU/L (ref 0–44)
AST: 27 IU/L (ref 0–40)
Albumin/Globulin Ratio: 1.5 (ref 1.2–2.2)
Albumin: 4.4 g/dL (ref 3.8–4.8)
BUN/Creatinine Ratio: 11 (ref 10–24)
BUN: 15 mg/dL (ref 8–27)
Bilirubin Total: 0.3 mg/dL (ref 0.0–1.2)
CO2: 22 mmol/L (ref 20–29)
Calcium: 9.8 mg/dL (ref 8.6–10.2)
Chloride: 103 mmol/L (ref 96–106)
Creatinine, Ser: 1.31 mg/dL — ABNORMAL HIGH (ref 0.76–1.27)
GFR calc Af Amer: 65 mL/min/{1.73_m2} (ref >59)
GFR calc non Af Amer: 56 mL/min/{1.73_m2} — ABNORMAL LOW (ref >59)
Globulin, Total: 3 g/dL (ref 1.5–4.5)
Glucose: 105 mg/dL — ABNORMAL HIGH (ref 65–99)
Potassium: 3.9 mmol/L (ref 3.5–5.2)
Sodium: 142 mmol/L (ref 134–144)
Total Protein: 7.4 g/dL (ref 6.0–8.5)

## 2018-06-27 LAB — TSH: TSH: 2.97 u[IU]/mL (ref 0.450–4.500)

## 2018-06-27 LAB — CBC WITH DIFFERENTIAL/PLATELET
Basophils Absolute: 0 10*3/uL (ref 0.0–0.2)
Basos: 1 %
EOS (ABSOLUTE): 0.1 10*3/uL (ref 0.0–0.4)
Eos: 3 %
Hematocrit: 48.5 % (ref 37.5–51.0)
Hemoglobin: 16.6 g/dL (ref 13.0–17.7)
Immature Grans (Abs): 0 10*3/uL (ref 0.0–0.1)
Immature Granulocytes: 0 %
Lymphocytes Absolute: 1.5 10*3/uL (ref 0.7–3.1)
Lymphs: 30 %
MCH: 27.2 pg (ref 26.6–33.0)
MCHC: 34.2 g/dL (ref 31.5–35.7)
MCV: 80 fL (ref 79–97)
MONOS ABS: 0.4 10*3/uL (ref 0.1–0.9)
Monocytes: 8 %
Neutrophils Absolute: 3 10*3/uL (ref 1.4–7.0)
Neutrophils: 58 %
PLATELETS: 212 10*3/uL (ref 150–450)
RBC: 6.1 x10E6/uL — ABNORMAL HIGH (ref 4.14–5.80)
RDW: 15.1 % (ref 11.6–15.4)
WBC: 5.1 10*3/uL (ref 3.4–10.8)

## 2018-06-27 LAB — PSA: Prostate Specific Ag, Serum: 5.2 ng/mL — ABNORMAL HIGH (ref 0.0–4.0)

## 2018-07-10 ENCOUNTER — Telehealth: Payer: Self-pay

## 2018-07-10 DIAGNOSIS — Z1211 Encounter for screening for malignant neoplasm of colon: Secondary | ICD-10-CM

## 2018-07-10 NOTE — Telephone Encounter (Signed)
Copied from [place] [phone]. Topic: General - Other >> Jul 10, 2018  9:59 AM [name] wrote: Reason for CRM:pt calling to check status of labs from most recent physical. Please advise >> Jul 10, 2018 10:08 AM [name], CMA wrote: Please advise.  >> Jul 10, 2018 12:39 PM [name], MD wrote: Call his blood work was stable with his PSA lower than in years. >> Jul 10, 2018  1:43 PM [name], [name], CMA wrote: Patient notified. Patient requesting an order for a colonoscopy, order placed.

## 2018-07-15 ENCOUNTER — Other Ambulatory Visit: Payer: Self-pay

## 2018-07-15 ENCOUNTER — Telehealth: Payer: Self-pay | Admitting: Gastroenterology

## 2018-07-15 DIAGNOSIS — Z1211 Encounter for screening for malignant neoplasm of colon: Secondary | ICD-10-CM

## 2018-07-15 NOTE — Telephone Encounter (Signed)
Patient called returning michelle's call to schedule a colonoscopy

## 2018-07-15 NOTE — Telephone Encounter (Signed)
Call returned.  Patient has been scheduled for his colonoscopy 08/19/18 at [facility] with Dr. Bonna Gains.  Thanks [employer]

## 2018-08-12 ENCOUNTER — Encounter: Payer: Self-pay | Admitting: Anesthesiology

## 2018-08-12 ENCOUNTER — Encounter: Payer: Self-pay | Admitting: *Deleted

## 2018-08-12 ENCOUNTER — Other Ambulatory Visit: Payer: Self-pay

## 2018-08-14 ENCOUNTER — Telehealth: Payer: Self-pay

## 2018-08-14 NOTE — Telephone Encounter (Signed)
LVM on patients home and cell to inform him his colonoscopy scheduled for 08/19/18 at [facility] has been canceled due to [place] [address] and we will call him back at a later time to reschedule.  Thanks [employer]

## 2018-08-19 ENCOUNTER — Ambulatory Visit: Admission: RE | Admit: 2018-08-19 | Payer: Medicare HMO | Source: Home / Self Care | Admitting: Gastroenterology

## 2018-08-19 SURGERY — COLONOSCOPY WITH PROPOFOL
Anesthesia: Choice

## 2018-09-01 ENCOUNTER — Telehealth: Payer: Self-pay

## 2018-09-01 NOTE — Telephone Encounter (Signed)
LVM asking patient to call office if he would like to reschedule his colonoscopy for the month of June/July.  Thanks [employer]

## 2019-07-27 ENCOUNTER — Ambulatory Visit (INDEPENDENT_AMBULATORY_CARE_PROVIDER_SITE_OTHER): Payer: Medicare HMO | Admitting: Family Medicine

## 2019-07-27 ENCOUNTER — Encounter: Payer: Self-pay | Admitting: Family Medicine

## 2019-07-27 ENCOUNTER — Other Ambulatory Visit: Payer: Self-pay

## 2019-07-27 VITALS — BP 136/84 | HR 61 | Temp 98.3°F | Ht 69.5 in | Wt 201.0 lb

## 2019-07-27 DIAGNOSIS — E782 Mixed hyperlipidemia: Secondary | ICD-10-CM | POA: Diagnosis not present

## 2019-07-27 DIAGNOSIS — N400 Enlarged prostate without lower urinary tract symptoms: Secondary | ICD-10-CM | POA: Diagnosis not present

## 2019-07-27 DIAGNOSIS — Z1211 Encounter for screening for malignant neoplasm of colon: Secondary | ICD-10-CM

## 2019-07-27 DIAGNOSIS — R972 Elevated prostate specific antigen [PSA]: Secondary | ICD-10-CM

## 2019-07-27 DIAGNOSIS — I10 Essential (primary) hypertension: Secondary | ICD-10-CM | POA: Diagnosis not present

## 2019-07-27 DIAGNOSIS — Z7189 Other specified counseling: Secondary | ICD-10-CM | POA: Diagnosis not present

## 2019-07-27 DIAGNOSIS — Z0000 Encounter for general adult medical examination without abnormal findings: Secondary | ICD-10-CM

## 2019-07-27 DIAGNOSIS — Z1212 Encounter for screening for malignant neoplasm of rectum: Secondary | ICD-10-CM

## 2019-07-27 LAB — UA/M W/RFLX CULTURE, ROUTINE
Bilirubin, UA: NEGATIVE
Glucose, UA: NEGATIVE
Ketones, UA: NEGATIVE
Leukocytes,UA: NEGATIVE
Nitrite, UA: NEGATIVE
Protein,UA: NEGATIVE
RBC, UA: NEGATIVE
Specific Gravity, UA: 1.02 (ref 1.005–1.030)
Urobilinogen, Ur: 1 mg/dL (ref 0.2–1.0)
pH, UA: 5.5 (ref 5.0–7.5)

## 2019-07-27 LAB — MICROALBUMIN, URINE WAIVED
Creatinine, Urine Waived: 100 mg/dL (ref 10–300)
Microalb, Ur Waived: 30 mg/L — ABNORMAL HIGH (ref 0–19)
Microalb/Creat Ratio: <30 mg/g (ref <30)

## 2019-07-27 MED ORDER — ATORVASTATIN CALCIUM 40 MG PO TABS: 20.0000 mg | ORAL_TABLET | Freq: Every day | ORAL | 1 refills | Status: DC

## 2019-07-27 MED ORDER — TADALAFIL 20 MG PO TABS: 10.0000 mg | ORAL_TABLET | ORAL | 11 refills | Status: DC | PRN

## 2019-07-27 MED ORDER — LOSARTAN POTASSIUM 100 MG PO TABS: 100.0000 mg | ORAL_TABLET | Freq: Every day | ORAL | 1 refills | Status: DC

## 2019-07-27 MED ORDER — HYDROCHLOROTHIAZIDE 25 MG PO TABS: 25.0000 mg | ORAL_TABLET | Freq: Every day | ORAL | 1 refills | Status: DC

## 2019-07-27 MED ORDER — AMLODIPINE BESYLATE 10 MG PO TABS: 10.0000 mg | ORAL_TABLET | Freq: Every day | ORAL | 1 refills | Status: DC
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
## 2019-07-27 NOTE — Assessment & Plan Note (Signed)
Under good control on current regimen. Continue current regimen. Continue to monitor. Call with any concerns. Refills given. Labs drawn today.   

## 2019-07-27 NOTE — Patient Instructions (Addendum)
Preventative Services:  Health Risk Assessment and Personalized Prevention Plan: Done today Bone Mass Measurements: N/A CVD Screening: Done today Colon Cancer Screening: Ordered today Depression Screening: Done today Diabetes Screening: Done today Glaucoma Screening: See your eye doctor Hepatitis B vaccine: N/A Hepatitis C screening: Up to date HIV Screening: Up to date Flu Vaccine: Up to date Lung cancer Screening: N/A Obesity Screening: Done today Pneumonia Vaccines (2): Will hold on pneuvax until 45 days after his 2nd COVID STI Screening: N/A PSA screening: Done today   Health Maintenance After Age 68 After age 70, you are at a higher risk for certain long-term diseases and infections as well as injuries from falls. Falls are a major cause of broken bones and head injuries in people who are older than age 20. Getting regular preventive care can help to keep you healthy and well. Preventive care includes getting regular testing and making lifestyle changes as recommended by your health care provider. Talk with your health care provider about:  Which screenings and tests you should have. A screening is a test that checks for a disease when you have no symptoms.  A diet and exercise plan that is right for you. What should I know about screenings and tests to prevent falls? Screening and testing are the best ways to find a health problem early. Early diagnosis and treatment give you the best chance of managing medical conditions that are common after age 60. Certain conditions and lifestyle choices may make you more likely to have a fall. Your health care provider may recommend:  Regular vision checks. Poor vision and conditions such as cataracts can make you more likely to have a fall. If you wear glasses, make sure to get your prescription updated if your vision changes.  Medicine review. Work with your health care provider to regularly review all of the medicines you are taking,  including over-the-counter medicines. Ask your health care provider about any side effects that may make you more likely to have a fall. Tell your health care provider if any medicines that you take make you feel dizzy or sleepy.  Osteoporosis screening. Osteoporosis is a condition that causes the bones to get weaker. This can make the bones weak and cause them to break more easily.  Blood pressure screening. Blood pressure changes and medicines to control blood pressure can make you feel dizzy.  Strength and balance checks. Your health care provider may recommend certain tests to check your strength and balance while standing, walking, or changing positions.  Foot health exam. Foot pain and numbness, as well as not wearing proper footwear, can make you more likely to have a fall.  Depression screening. You may be more likely to have a fall if you have a fear of falling, feel emotionally low, or feel unable to do activities that you used to do.  Alcohol use screening. Using too much alcohol can affect your balance and may make you more likely to have a fall. What actions can I take to lower my risk of falls? General instructions  Talk with your health care provider about your risks for falling. Tell your health care provider if: ? You fall. Be sure to tell your health care provider about all falls, even ones that seem minor. ? You feel dizzy, sleepy, or off-balance.  Take over-the-counter and prescription medicines only as told by your health care provider. These include any supplements.  Eat a healthy diet and maintain a healthy weight. A healthy diet includes  low-fat dairy products, low-fat (lean) meats, and fiber from whole grains, beans, and lots of fruits and vegetables. Home safety  Remove any tripping hazards, such as rugs, cords, and clutter.  Install safety equipment such as grab bars in bathrooms and safety rails on stairs.  Keep rooms and walkways  well-lit. Activity   Follow a regular exercise program to stay fit. This will help you maintain your balance. Ask your health care provider what types of exercise are appropriate for you.  If you need a cane or walker, use it as recommended by your health care provider.  Wear supportive shoes that have nonskid soles. Lifestyle  Do not drink alcohol if your health care provider tells you not to drink.  If you drink alcohol, limit how much you have: ? 0-1 drink a day for women. ? 0-2 drinks a day for men.  Be aware of how much alcohol is in your drink. In the [place] one drink equals one typical bottle of beer (12 oz), one-half glass of wine (5 oz), or one shot of hard liquor (1 oz).  Do not use any products that contain nicotine or tobacco, such as cigarettes and e-cigarettes. If you need help quitting, ask your health care provider. Summary  Having a healthy lifestyle and getting preventive care can help to protect your health and wellness after age 7.  Screening and testing are the best way to find a health problem early and help you avoid having a fall. Early diagnosis and treatment give you the best chance for managing medical conditions that are more common for people who are older than age 73.  Falls are a major cause of broken bones and head injuries in people who are older than age 53. Take precautions to prevent a fall at home.  Work with your health care provider to learn what changes you can make to improve your health and wellness and to prevent falls. This information is not intended to replace advice given to you by your health care provider. Make sure you discuss any questions you have with your health care provider. Document Revised: 09/04/2018 Document Reviewed: 03/27/2017 Elsevier Patient Education  2020 [employer].

## 2019-07-27 NOTE — Assessment & Plan Note (Signed)
A voluntary discussion about advance care planning including the explanation and discussion of advance directives was extensively discussed  with the patient for 4 minutes with patient present.  Explanation about the health care proxy and Living will was reviewed and packet with forms with explanation of how to fill them out was given.  During this discussion, the patient was able to identify a health care proxy as his daughter and plans to fill out the paperwork required.  Patient was offered a separate [place] visit for further assistance with forms.

## 2019-07-27 NOTE — Assessment & Plan Note (Signed)
Rechecking levels today. Await results. Treat as needed.  

## 2019-07-28 ENCOUNTER — Other Ambulatory Visit: Payer: Self-pay | Admitting: Family Medicine

## 2019-07-28 DIAGNOSIS — R972 Elevated prostate specific antigen [PSA]: Secondary | ICD-10-CM

## 2019-07-28 LAB — CBC WITH DIFFERENTIAL/PLATELET
Basophils Absolute: 0 10*3/uL (ref 0.0–0.2)
Basos: 1 %
EOS (ABSOLUTE): 0.1 10*3/uL (ref 0.0–0.4)
Eos: 3 %
Hematocrit: 47.7 % (ref 37.5–51.0)
Hemoglobin: 15.7 g/dL (ref 13.0–17.7)
Immature Grans (Abs): 0 10*3/uL (ref 0.0–0.1)
Immature Granulocytes: 0 %
Lymphocytes Absolute: 1.7 10*3/uL (ref 0.7–3.1)
Lymphs: 40 %
MCH: 26 pg — ABNORMAL LOW (ref 26.6–33.0)
MCHC: 32.9 g/dL (ref 31.5–35.7)
MCV: 79 fL (ref 79–97)
Monocytes Absolute: 0.4 10*3/uL (ref 0.1–0.9)
Monocytes: 10 %
Neutrophils Absolute: 1.9 10*3/uL (ref 1.4–7.0)
Neutrophils: 46 %
Platelets: 249 10*3/uL (ref 150–450)
RBC: 6.05 x10E6/uL — ABNORMAL HIGH (ref 4.14–5.80)
RDW: 15.3 % (ref 11.6–15.4)
WBC: 4.2 10*3/uL (ref 3.4–10.8)

## 2019-07-28 LAB — COMPREHENSIVE METABOLIC PANEL
ALT: 16 IU/L (ref 0–44)
AST: 22 IU/L (ref 0–40)
Albumin/Globulin Ratio: 1.4 (ref 1.2–2.2)
Albumin: 4.2 g/dL (ref 3.8–4.8)
Alkaline Phosphatase: 63 IU/L (ref 39–117)
BUN/Creatinine Ratio: 10 (ref 10–24)
BUN: 12 mg/dL (ref 8–27)
Bilirubin Total: 0.4 mg/dL (ref 0.0–1.2)
CO2: 23 mmol/L (ref 20–29)
Calcium: 9.4 mg/dL (ref 8.6–10.2)
Chloride: 100 mmol/L (ref 96–106)
Creatinine, Ser: 1.18 mg/dL (ref 0.76–1.27)
GFR calc Af Amer: 73 mL/min/{1.73_m2} (ref >59)
GFR calc non Af Amer: 63 mL/min/{1.73_m2} (ref >59)
Globulin, Total: 2.9 g/dL (ref 1.5–4.5)
Glucose: 85 mg/dL (ref 65–99)
Potassium: 4.1 mmol/L (ref 3.5–5.2)
Sodium: 136 mmol/L (ref 134–144)
Total Protein: 7.1 g/dL (ref 6.0–8.5)

## 2019-07-28 LAB — LIPID PANEL W/O CHOL/HDL RATIO
Cholesterol, Total: 146 mg/dL (ref 100–199)
HDL: 44 mg/dL (ref >39)
LDL Chol Calc (NIH): 79 mg/dL (ref 0–99)
Triglycerides: 131 mg/dL (ref 0–149)
VLDL Cholesterol Cal: 23 mg/dL (ref 5–40)

## 2019-07-28 LAB — PSA: Prostate Specific Ag, Serum: 6.5 ng/mL — ABNORMAL HIGH (ref 0.0–4.0)

## 2019-07-28 LAB — TSH: TSH: 4.48 u[IU]/mL (ref 0.450–4.500)

## 2019-08-06 ENCOUNTER — Other Ambulatory Visit: Payer: Self-pay

## 2019-08-06 ENCOUNTER — Telehealth: Payer: Self-pay

## 2019-08-06 DIAGNOSIS — Z1211 Encounter for screening for malignant neoplasm of colon: Secondary | ICD-10-CM

## 2019-08-06 NOTE — Telephone Encounter (Signed)
Gastroenterology Pre-Procedure Review  Request Date: PENDING PT CALL BACK  (Needs to check with his daughter) Requesting Physician: Dr. Vicente Males  PATIENT REVIEW QUESTIONS: The patient responded to the following health history questions as indicated:    1. Are you having any GI issues? no 2. Do you have a personal history of Polyps? unsure 3. Do you have a family history of Colon Cancer or Polyps? no 4. Diabetes Mellitus? no 5. Joint replacements in the past 12 months?no 6. Major health problems in the past 3 months?no 7. Any artificial heart valves, MVP, or defibrillator?no    MEDICATIONS & ALLERGIES:    Patient reports the following regarding taking any anticoagulation/antiplatelet therapy:   Plavix, Coumadin, Eliquis, Xarelto, Lovenox, Pradaxa, Brilinta, or Effient? no Aspirin? yes (81 mg daily)  Patient confirms/reports the following medications:  Current Outpatient Medications  Medication Sig Dispense Refill  . amLODipine (NORVASC) 10 MG tablet Take 1 tablet (10 mg total) by mouth daily. 90 tablet 1  . aspirin EC 81 MG tablet Take 81 mg by mouth daily.    Marland Kitchen atorvastatin (LIPITOR) 40 MG tablet Take 0.5 tablets (20 mg total) by mouth daily. 1/2 tablet daily 45 tablet 1  . hydrochlorothiazide (HYDRODIURIL) 25 MG tablet Take 1 tablet (25 mg total) by mouth daily. 90 tablet 1  . losartan (COZAAR) 100 MG tablet Take 1 tablet (100 mg total) by mouth daily. 90 tablet 1  . tadalafil (CIALIS) 20 MG tablet Take 0.5-1 tablets (10-20 mg total) by mouth every other day as needed for erectile dysfunction. 5 tablet 11   No current facility-administered medications for this visit.    Patient confirms/reports the following allergies:  Allergies  Allergen Reactions  . Ace Inhibitors Other (See Comments)    ARF    No orders of the defined types were placed in this encounter.   AUTHORIZATION INFORMATION Primary Insurance: 1D#: Group #:  Secondary Insurance: 1D#: Group #:  SCHEDULE  INFORMATION: Date: PENDING PT CALL BACK Time: Location:

## 2019-08-20 ENCOUNTER — Encounter: Payer: Self-pay | Admitting: Gastroenterology

## 2019-08-20 ENCOUNTER — Other Ambulatory Visit: Payer: Self-pay

## 2019-08-24 ENCOUNTER — Other Ambulatory Visit: Payer: Self-pay

## 2019-08-24 ENCOUNTER — Telehealth: Payer: Self-pay

## 2019-08-24 MED ORDER — NA SULFATE-K SULFATE-MG SULF 17.5-3.13-1.6 GM/177ML PO SOLN: 1.0000 | Freq: Once | ORAL | 0 refills | Status: AC

## 2019-08-24 NOTE — Telephone Encounter (Signed)
LVM returning patients call in regards to his colonoscopy instructions being needed.  Asked patient to come by the office to pick up colonoscopy instructions.  SuPrep Rx has been sent electronically to the pharmacy listed in his chart.  Thanks,  [place], [place]

## 2019-08-27 ENCOUNTER — Other Ambulatory Visit
Admission: RE | Admit: 2019-08-27 | Discharge: 2019-08-27 | Disposition: A | Discharge status: Home / Self Care | Payer: Medicare HMO | Source: Ambulatory Visit | Attending: Gastroenterology | Admitting: Gastroenterology

## 2019-08-27 ENCOUNTER — Other Ambulatory Visit: Payer: Self-pay

## 2019-08-27 DIAGNOSIS — Z01812 Encounter for preprocedural laboratory examination: Secondary | ICD-10-CM | POA: Insufficient documentation

## 2019-08-27 DIAGNOSIS — Z20822 Contact with and (suspected) exposure to covid-19: Secondary | ICD-10-CM | POA: Insufficient documentation

## 2019-08-27 LAB — SARS CORONAVIRUS 2 (TAT 6-24 HRS): SARS Coronavirus 2: NEGATIVE

## 2019-08-27 NOTE — Discharge Instructions (Signed)
General Anesthesia, Adult, Care After This sheet gives you information about how to care for yourself after your procedure. Your health care provider may also give you more specific instructions. If you have problems or questions, contact your health care provider. What can I expect after the procedure? After the procedure, the following side effects are common:  Pain or discomfort at the IV site.  Nausea.  Vomiting.  Sore throat.  Trouble concentrating.  Feeling cold or chills.  Weak or tired.  Sleepiness and fatigue.  Soreness and body aches. These side effects can affect parts of the body that were not involved in surgery. Follow these instructions at home:  For at least 24 hours after the procedure:  Have a responsible adult stay with you. It is important to have someone help care for you until you are awake and alert.  Rest as needed.  Do not: ? Participate in activities in which you could fall or become injured. ? Drive. ? Use heavy machinery. ? Drink alcohol. ? Take sleeping pills or medicines that cause drowsiness. ? Make important decisions or sign legal documents. ? Take care of children on your own. Eating and drinking  Follow any instructions from your health care provider about eating or drinking restrictions.  When you feel hungry, start by eating small amounts of foods that are soft and easy to digest (bland), such as toast. Gradually return to your regular diet.  Drink enough fluid to keep your urine pale yellow.  If you vomit, rehydrate by drinking water, juice, or clear broth. General instructions  If you have sleep apnea, surgery and certain medicines can increase your risk for breathing problems. Follow instructions from your health care provider about wearing your sleep device: ? Anytime you are sleeping, including during daytime naps. ? While taking prescription pain medicines, sleeping medicines, or medicines that make you drowsy.  Return to  your normal activities as told by your health care provider. Ask your health care provider what activities are safe for you.  Take over-the-counter and prescription medicines only as told by your health care provider.  If you smoke, do not smoke without supervision.  Keep all follow-up visits as told by your health care provider. This is important. Contact a health care provider if:  You have nausea or vomiting that does not get better with medicine.  You cannot eat or drink without vomiting.  You have pain that does not get better with medicine.  You are unable to pass urine.  You develop a skin rash.  You have a fever.  You have redness around your IV site that gets worse. Get help right away if:  You have difficulty breathing.  You have chest pain.  You have blood in your urine or stool, or you vomit blood. Summary  After the procedure, it is common to have a sore throat or nausea. It is also common to feel tired.  Have a responsible adult stay with you for the first 24 hours after general anesthesia. It is important to have someone help care for you until you are awake and alert.  When you feel hungry, start by eating small amounts of foods that are soft and easy to digest (bland), such as toast. Gradually return to your regular diet.  Drink enough fluid to keep your urine pale yellow.  Return to your normal activities as told by your health care provider. Ask your health care provider what activities are safe for you. This information is not   intended to replace advice given to you by your health care provider. Make sure you discuss any questions you have with your health care provider. Document Revised: 05/17/2017 Document Reviewed: 12/28/2016 Elsevier Patient Education  2020 Elsevier Inc.  

## 2019-08-31 ENCOUNTER — Ambulatory Visit: Payer: Medicare HMO | Admitting: Anesthesiology

## 2019-08-31 ENCOUNTER — Ambulatory Visit
Admission: RE | Admit: 2019-08-31 | Discharge: 2019-08-31 | Disposition: A | Discharge status: Home / Self Care | Payer: Medicare HMO | Attending: Gastroenterology | Admitting: Gastroenterology

## 2019-08-31 ENCOUNTER — Encounter: Payer: Self-pay | Admitting: Gastroenterology

## 2019-08-31 ENCOUNTER — Other Ambulatory Visit: Payer: Self-pay

## 2019-08-31 ENCOUNTER — Encounter
Admission: RE | Disposition: A | Discharge status: Home / Self Care | Payer: Self-pay | Source: Home / Self Care | Attending: Gastroenterology

## 2019-08-31 DIAGNOSIS — K641 Second degree hemorrhoids: Secondary | ICD-10-CM | POA: Insufficient documentation

## 2019-08-31 DIAGNOSIS — Z7982 Long term (current) use of aspirin: Secondary | ICD-10-CM | POA: Diagnosis not present

## 2019-08-31 DIAGNOSIS — Z888 Allergy status to other drugs, medicaments and biological substances status: Secondary | ICD-10-CM | POA: Insufficient documentation

## 2019-08-31 DIAGNOSIS — Z87891 Personal history of nicotine dependence: Secondary | ICD-10-CM | POA: Insufficient documentation

## 2019-08-31 DIAGNOSIS — E785 Hyperlipidemia, unspecified: Secondary | ICD-10-CM | POA: Insufficient documentation

## 2019-08-31 DIAGNOSIS — Z1211 Encounter for screening for malignant neoplasm of colon: Secondary | ICD-10-CM

## 2019-08-31 DIAGNOSIS — I10 Essential (primary) hypertension: Secondary | ICD-10-CM | POA: Insufficient documentation

## 2019-08-31 DIAGNOSIS — Z79899 Other long term (current) drug therapy: Secondary | ICD-10-CM | POA: Diagnosis not present

## 2019-08-31 HISTORY — PX: COLONOSCOPY WITH PROPOFOL: SHX5780

## 2019-08-31 SURGERY — COLONOSCOPY WITH PROPOFOL
Anesthesia: General | Site: Rectum

## 2019-08-31 MED ORDER — STERILE WATER FOR IRRIGATION IR SOLN
Status: DC | PRN
  Administered 2019-08-31: 09:00:00 100 mL

## 2019-08-31 MED ORDER — PROPOFOL 10 MG/ML IV BOLUS
INTRAVENOUS | Status: DC | PRN
  Administered 2019-08-31 (×5): 20 mg via INTRAVENOUS
  Administered 2019-08-31: 80 mg via INTRAVENOUS
  Administered 2019-08-31: 20 mg via INTRAVENOUS

## 2019-08-31 MED ORDER — LACTATED RINGERS IV SOLN: INTRAVENOUS | Status: DC

## 2019-08-31 MED ORDER — SODIUM CHLORIDE 0.9 % IV SOLN: INTRAVENOUS | Status: DC

## 2019-08-31 MED ORDER — LACTATED RINGERS IV SOLN: 10.0000 mL/h | INTRAVENOUS | Status: DC

## 2019-08-31 MED ORDER — LIDOCAINE HCL (CARDIAC) PF 100 MG/5ML IV SOSY
PREFILLED_SYRINGE | INTRAVENOUS | Status: DC | PRN
  Administered 2019-08-31: 50 mg via INTRAVENOUS

## 2019-08-31 MED ORDER — ACETAMINOPHEN 160 MG/5ML PO SOLN: 325.0000 mg | Freq: Once | ORAL | Status: DC

## 2019-08-31 MED ORDER — ACETAMINOPHEN 325 MG PO TABS: 325.0000 mg | ORAL_TABLET | Freq: Once | ORAL | Status: DC

## 2019-08-31 SURGICAL SUPPLY — 5 items
CANISTER SUCT 1200ML W/VALVE (MISCELLANEOUS) ×2 IMPLANT
GOWN CVR UNV OPN BCK APRN NK (MISCELLANEOUS) ×2 IMPLANT
GOWN ISOL THUMB LOOP REG UNIV (MISCELLANEOUS) ×2
KIT ENDO PROCEDURE OLY (KITS) ×2 IMPLANT
WATER STERILE IRR 250ML POUR (IV SOLUTION) ×2 IMPLANT

## 2019-08-31 NOTE — Anesthesia Procedure Notes (Signed)
Procedure Name: MAC Date/Time: 08/31/2019 8:24 AM Performed by: Vanetta Shawl, CRNA Pre-anesthesia Checklist: Patient identified, Emergency Drugs available, Suction available, Timeout performed and Patient being monitored Patient Re-evaluated:Patient Re-evaluated prior to induction Oxygen Delivery Method: Nasal cannula Placement Confirmation: positive ETCO2

## 2019-08-31 NOTE — Op Note (Signed)
[facility] Gastroenterology Patient Name: Donald Le Procedure Date: 08/31/2019 8:19 AM MRN: [id number] Account #: [id number] Date of Birth: 1950/07/19 Admit Type: Outpatient Age: 69 Room: [facility] OR ROOM 01 Gender: Male Note Status: Finalized Procedure:             Colonoscopy Indications:           Screening for colorectal malignant neoplasm Providers:             [name] MD, MD Referring MD:          [name], MD (Referring MD) Medicines:             Propofol per Anesthesia Complications:         No immediate complications. Procedure:             Pre-Anesthesia Assessment:                        - Prior to the procedure, a History and Physical was                         performed, and patient medications and allergies were                         reviewed. The patient's tolerance of previous                         anesthesia was also reviewed. The risks and benefits                         of the procedure and the sedation options and risks                         were discussed with the patient. All questions were                         answered, and informed consent was obtained. Prior                         Anticoagulants: The patient has taken no previous                         anticoagulant or antiplatelet agents. ASA Grade                         Assessment: II - A patient with mild systemic disease.                         After reviewing the risks and benefits, the patient                         was deemed in satisfactory condition to undergo the                         procedure.                        After obtaining informed consent, the colonoscope was  passed under direct vision. Throughout the procedure,                         the patient's blood pressure, pulse, and oxygen                         saturations were monitored continuously. The was                         introduced through the anus and  advanced to the the                         cecum, identified by appendiceal orifice and ileocecal                         valve. The colonoscopy was performed without                         difficulty. The patient tolerated the procedure well.                         The quality of the bowel preparation was excellent. Findings:      The perianal and digital rectal examinations were normal.      Non-bleeding internal hemorrhoids were found during retroflexion. The       hemorrhoids were Grade II (internal hemorrhoids that prolapse but reduce       spontaneously). Impression:            - Non-bleeding internal hemorrhoids.                        - No specimens collected. Recommendation:        - Discharge patient to home.                        - Resume previous diet.                        - Continue present medications.                        - Repeat colonoscopy in 10 years for screening unless                         any change in family history or lower GI problems. Procedure Code(s):     --- Professional ---                        [phone], Colonoscopy, flexible; diagnostic, including                         collection of specimen(s) by brushing or washing, when                         performed (separate procedure) Diagnosis Code(s):     --- Professional ---                        Z12.11, Encounter for screening for malignant neoplasm  of colon CPT copyright 2019 American Medical Association. All rights reserved. The codes documented in this report are preliminary and upon coder review may  be revised to meet current compliance requirements. [name] MD, MD 08/31/2019 8:41:10 AM This report has been signed electronically. Number of Addenda: 0 Note Initiated On: 08/31/2019 8:19 AM Scope Withdrawal Time: 0 hours 7 minutes 26 seconds  Total Procedure Duration: 0 hours 13 minutes 10 seconds  Estimated Blood Loss:  Estimated blood loss: none.      [facility]

## 2019-08-31 NOTE — Anesthesia Preprocedure Evaluation (Signed)
Anesthesia Evaluation  Patient identified by MRN, date of birth, ID band Patient awake    Reviewed: Allergy & Precautions, H&P , NPO status , Patient's Chart, lab work & pertinent test results  Airway Mallampati: II  TM Distance: >3 FB Neck ROM: full    Dental no notable dental hx.    Pulmonary former smoker,    Pulmonary exam normal breath sounds clear to auscultation       Cardiovascular hypertension, Normal cardiovascular exam Rhythm:regular Rate:Normal     Neuro/Psych    GI/Hepatic   Endo/Other    Renal/GU      Musculoskeletal   Abdominal   Peds  Hematology   Anesthesia Other Findings   Reproductive/Obstetrics                             Anesthesia Physical Anesthesia Plan  ASA: II  Anesthesia Plan: General   Post-op Pain Management:    Induction: Intravenous  PONV Risk Score and Plan: 2 and Treatment may vary due to age or medical condition, TIVA and Propofol infusion  Airway Management Planned: Natural Airway  Additional Equipment:   Intra-op Plan:   Post-operative Plan:   Informed Consent: I have reviewed the patients History and Physical, chart, labs and discussed the procedure including the risks, benefits and alternatives for the proposed anesthesia with the patient or authorized representative who has indicated his/her understanding and acceptance.     Dental Advisory Given  Plan Discussed with: CRNA  Anesthesia Plan Comments:         Anesthesia Quick Evaluation

## 2019-08-31 NOTE — Transfer of Care (Signed)
Immediate Anesthesia Transfer of Care Note  Patient: Donald Le  Procedure(s) Performed: COLONOSCOPY WITH PROPOFOL (N/A Rectum)  Patient Location: PACU  Anesthesia Type: General  Level of Consciousness: awake, alert  and patient cooperative  Airway and Oxygen Therapy: Patient Spontanous Breathing and Patient connected to supplemental oxygen  Post-op Assessment: Post-op Vital signs reviewed, Patient's Cardiovascular Status Stable, Respiratory Function Stable, Patent Airway and No signs of Nausea or vomiting  Post-op Vital Signs: Reviewed and stable  Complications: No apparent anesthesia complications

## 2019-08-31 NOTE — Anesthesia Postprocedure Evaluation (Signed)
Anesthesia Post Note  Patient: Donald Le  Procedure(s) Performed: COLONOSCOPY WITH PROPOFOL (N/A Rectum)     Patient location during evaluation: PACU Anesthesia Type: General Level of consciousness: awake and alert and oriented Pain management: satisfactory to patient Vital Signs Assessment: post-procedure vital signs reviewed and stable Respiratory status: spontaneous breathing, nonlabored ventilation and respiratory function stable Cardiovascular status: blood pressure returned to baseline and stable Postop Assessment: Adequate PO intake and No signs of nausea or vomiting Anesthetic complications: no    Raliegh Ip
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
## 2019-09-01 ENCOUNTER — Encounter: Payer: Self-pay | Admitting: *Deleted
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
## 2019-09-02 ENCOUNTER — Ambulatory Visit: Payer: Medicare HMO | Admitting: Urology

## 2019-09-02 ENCOUNTER — Other Ambulatory Visit: Payer: Self-pay

## 2019-09-02 ENCOUNTER — Encounter: Payer: Self-pay | Admitting: Urology

## 2019-09-02 VITALS — BP 154/90 | HR 68 | Ht 70.0 in | Wt 197.0 lb

## 2019-09-02 DIAGNOSIS — R972 Elevated prostate specific antigen [PSA]: Secondary | ICD-10-CM

## 2019-09-04 LAB — PSA: Prostate Specific Ag, Serum: 5.5 ng/mL — ABNORMAL HIGH (ref 0.0–4.0)

## 2019-09-07 ENCOUNTER — Telehealth: Payer: Self-pay | Admitting: Urology

## 2019-09-07 NOTE — Telephone Encounter (Signed)
PSA is going down, now 5.5.  This is in the range where it has been over the past several years.  It is higher than I would expect for your age but the stability is reassuring.    I would recommend following up with PSA in 6 months again.  We can decide to do an MRI at that point if it still going up.  Alternatively, if he strongly interested in pursuing MRI now, we can go ahead and pursue that at this time.  Please schedule the appropriate follow-up.  [name], MD

## 2019-09-08 NOTE — Telephone Encounter (Signed)
Notified patient as instructed, patient pleased. Discussed follow-up appointments, patient agrees Psa Appt made for six months.

## 2019-09-08 NOTE — Telephone Encounter (Signed)
Left VM on home and cell to return my call to discuss.

## 2020-01-24 ENCOUNTER — Other Ambulatory Visit: Payer: Self-pay | Admitting: Family Medicine

## 2020-01-26 ENCOUNTER — Other Ambulatory Visit: Payer: Self-pay | Admitting: Family Medicine

## 2020-01-26 NOTE — Telephone Encounter (Signed)
Requested Prescriptions  Pending Prescriptions Disp Refills  . hydrochlorothiazide (HYDRODIURIL) 25 MG tablet [Pharmacy Med Name: HYDROCHLOROTHIAZIDE 25 MG TAB] 90 tablet 1    Sig: TAKE 1 TABLET BY MOUTH EVERY DAY     Cardiovascular: Diuretics - Thiazide Failed - 01/26/2020  1:56 AM      Failed - Last BP in normal range    BP Readings from Last 1 Encounters:  09/02/19 (!) 154/90         Failed - Valid encounter within last 6 months    Recent Outpatient Visits          6 months ago Wellness examination   [employer], [place], DO   1 year ago Flu vaccine need   [employer], Jeannette How, MD   2 years ago Essential hypertension   [facility] [place], Jeannette How, MD   2 years ago Acute sinusitis, recurrence not specified, unspecified location   [facility] [place], Jeannette How, MD   3 years ago Essential hypertension   [facility], Jeannette How, MD             Passed - Ca in normal range and within 360 days    Calcium  Date Value Ref Range Status  07/27/2019 9.4 8.6 - 10.2 mg/dL Final         Passed - Cr in normal range and within 360 days    Creatinine, Ser  Date Value Ref Range Status  07/27/2019 1.18 0.76 - 1.27 mg/dL Final         Passed - K in normal range and within 360 days    Potassium  Date Value Ref Range Status  07/27/2019 4.1 3.5 - 5.2 mmol/L Final         Passed - Na in normal range and within 360 days    Sodium  Date Value Ref Range Status  07/27/2019 136 134 - 144 mmol/L Final         One month courtesy refill with reminder for patient to schedule an appt for 6 month follow-up.

## 2020-02-20 ENCOUNTER — Telehealth: Payer: Self-pay | Admitting: Family Medicine

## 2020-02-20 NOTE — Telephone Encounter (Signed)
Requested medication (s) are due for refill today: yes  Requested medication (s) are on the active medication list: yes  Last refill:  losartan:8/31/ #30  HCTZ: 01/26/20 #30  Future visit scheduled: no  Notes to clinic:  Pt needs appt- called pt and LM on VM to call to make appt.   Requested Prescriptions  Pending Prescriptions Disp Refills   losartan (COZAAR) 100 MG tablet [Pharmacy Med Name: LOSARTAN POTASSIUM 100 MG TAB] 30 tablet 0    Sig: TAKE 1 TABLET BY MOUTH EVERY DAY      Cardiovascular:  Angiotensin Receptor Blockers Failed - 02/20/2020 10:30 AM      Failed - Cr in normal range and within 180 days    Creatinine, Ser  Date Value Ref Range Status  07/27/2019 1.18 0.76 - 1.27 mg/dL Final          Failed - K in normal range and within 180 days    Potassium  Date Value Ref Range Status  07/27/2019 4.1 3.5 - 5.2 mmol/L Final          Failed - Last BP in normal range    BP Readings from Last 1 Encounters:  09/02/19 (!) 154/90          Failed - Valid encounter within last 6 months    Recent Outpatient Visits           6 months ago Wellness examination   [employer], Megan P, DO   1 year ago Flu vaccine need   [employer], Jeannette How, MD   2 years ago Essential hypertension   [facility] [place], Jeannette How, MD   2 years ago Acute sinusitis, recurrence not specified, unspecified location   [facility] [place], Jeannette How, MD   3 years ago Essential hypertension   [facility], Jeannette How, MD              Passed - Patient is not pregnant        hydrochlorothiazide (HYDRODIURIL) 25 MG tablet [Pharmacy Med Name: HYDROCHLOROTHIAZIDE 25 MG TAB] 30 tablet 0    Sig: TAKE 1 TABLET BY MOUTH EVERY DAY      Cardiovascular: Diuretics - Thiazide Failed - 02/20/2020 10:30 AM      Failed - Last BP in normal range    BP Readings from Last 1 Encounters:  09/02/19 (!) 154/90          Failed  - Valid encounter within last 6 months    Recent Outpatient Visits           6 months ago Wellness examination   [employer], Megan P, DO   1 year ago Flu vaccine need   [facility] [place], Jeannette How, MD   2 years ago Essential hypertension   [facility] [place], Jeannette How, MD   2 years ago Acute sinusitis, recurrence not specified, unspecified location   [facility] [place], Jeannette How, MD   3 years ago Essential hypertension   [place], Jeannette How, MD              Passed - Ca in normal range and within 360 days    Calcium  Date Value Ref Range Status  07/27/2019 9.4 8.6 - 10.2 mg/dL Final          Passed - Cr in normal range and within 360 days    Creatinine, Ser  Date  Value Ref Range Status  07/27/2019 1.18 0.76 - 1.27 mg/dL Final          Passed - K in normal range and within 360 days    Potassium  Date Value Ref Range Status  07/27/2019 4.1 3.5 - 5.2 mmol/L Final          Passed - Na in normal range and within 360 days    Sodium  Date Value Ref Range Status  07/27/2019 136 134 - 144 mmol/L Final

## 2020-02-22 NOTE — Telephone Encounter (Signed)
Last seen 07/27/19

## 2020-02-24 NOTE — Telephone Encounter (Signed)
Pt came in to office to schedule apt.Pt scheduled for 03/08/2020 the patient verbalized understanding.

## 2020-03-04 ENCOUNTER — Other Ambulatory Visit: Payer: Self-pay | Admitting: Family Medicine

## 2020-03-04 DIAGNOSIS — R972 Elevated prostate specific antigen [PSA]: Secondary | ICD-10-CM

## 2020-03-06 ENCOUNTER — Other Ambulatory Visit: Payer: Self-pay | Admitting: Family Medicine

## 2020-03-06 DIAGNOSIS — I10 Essential (primary) hypertension: Secondary | ICD-10-CM

## 2020-03-07 ENCOUNTER — Other Ambulatory Visit: Payer: Medicare HMO

## 2020-03-07 ENCOUNTER — Other Ambulatory Visit: Payer: Self-pay

## 2020-03-07 DIAGNOSIS — R972 Elevated prostate specific antigen [PSA]: Secondary | ICD-10-CM

## 2020-03-08 ENCOUNTER — Encounter: Payer: Self-pay | Admitting: Nurse Practitioner

## 2020-03-08 ENCOUNTER — Ambulatory Visit (INDEPENDENT_AMBULATORY_CARE_PROVIDER_SITE_OTHER): Payer: Medicare HMO | Admitting: Nurse Practitioner

## 2020-03-08 VITALS — BP 128/73 | HR 77 | Temp 98.4°F | Wt 206.6 lb

## 2020-03-08 DIAGNOSIS — I10 Essential (primary) hypertension: Secondary | ICD-10-CM

## 2020-03-08 DIAGNOSIS — E782 Mixed hyperlipidemia: Secondary | ICD-10-CM

## 2020-03-08 DIAGNOSIS — R7309 Other abnormal glucose: Secondary | ICD-10-CM | POA: Diagnosis not present

## 2020-03-08 DIAGNOSIS — E785 Hyperlipidemia, unspecified: Secondary | ICD-10-CM

## 2020-03-08 DIAGNOSIS — E1159 Type 2 diabetes mellitus with other circulatory complications: Secondary | ICD-10-CM

## 2020-03-08 DIAGNOSIS — I152 Hypertension secondary to endocrine disorders: Secondary | ICD-10-CM

## 2020-03-08 DIAGNOSIS — E1169 Type 2 diabetes mellitus with other specified complication: Secondary | ICD-10-CM

## 2020-03-08 LAB — PSA: Prostate Specific Ag, Serum: 6.6 ng/mL — ABNORMAL HIGH (ref 0.0–4.0)

## 2020-03-08 MED ORDER — HYDROCHLOROTHIAZIDE 25 MG PO TABS: 25.0000 mg | ORAL_TABLET | Freq: Every day | ORAL | 1 refills | Status: DC

## 2020-03-08 MED ORDER — AMLODIPINE BESYLATE 10 MG PO TABS: 10.0000 mg | ORAL_TABLET | Freq: Every day | ORAL | 1 refills | Status: DC

## 2020-03-08 MED ORDER — ATORVASTATIN CALCIUM 40 MG PO TABS: 20.0000 mg | ORAL_TABLET | Freq: Every day | ORAL | 1 refills | Status: DC

## 2020-03-08 MED ORDER — LOSARTAN POTASSIUM 100 MG PO TABS: 100.0000 mg | ORAL_TABLET | Freq: Every day | ORAL | 1 refills | Status: DC

## 2020-03-08 NOTE — Progress Notes (Signed)
BP 128/73   Pulse 77   Temp 98.4 F (36.9 C) (Oral)   Wt 206 lb 9.6 oz (93.7 kg)   SpO2 96%   BMI 29.64 kg/m    Subjective:    Patient ID: Donald Le, male    DOB: Dec 19, 1950, 69 y.o.   MRN: [id number]  HPI: Donald Le is a 69 y.o. male presenting for medication refill.  Chief Complaint  Patient presents with  . Hyperlipidemia  . Hypertension   HYPERTENSION / [place] Satisfied with current treatment? yes Duration of hypertension: chronic BP monitoring frequency: not checking BP range:  BP medication side effects: no Past BP meds:  Amlodipine 10 mg, losartan 100 mg, and HCTZ 25 mg Duration of hyperlipidemia: chronic Cholesterol medication side effects: no Cholesterol supplements: none Past cholesterol medications: atorvastatin 40 mg Medication compliance: excellent compliance Aspirin: yes Recent stressors: no Recurrent headaches: no Visual changes: no Palpitations: no Dyspnea: no Chest pain: no Lower extremity edema: no Dizzy/lightheaded: no  Allergies  Allergen Reactions  . Ace Inhibitors Other (See Comments)    ARF   Outpatient Encounter Medications as of 03/08/2020  Medication Sig  . amLODipine (NORVASC) 10 MG tablet Take 1 tablet (10 mg total) by mouth daily.  Marland Kitchen aspirin EC 81 MG tablet Take 81 mg by mouth daily.  Marland Kitchen atorvastatin (LIPITOR) 40 MG tablet Take 0.5 tablets (20 mg total) by mouth daily. 1/2 tablet daily  . hydrochlorothiazide (HYDRODIURIL) 25 MG tablet Take 1 tablet (25 mg total) by mouth daily.  Marland Kitchen losartan (COZAAR) 100 MG tablet Take 1 tablet (100 mg total) by mouth daily.  . Misc Natural Products (PROSTATE HEALTH PO) Take by mouth.  . tadalafil (CIALIS) 20 MG tablet Take 0.5-1 tablets (10-20 mg total) by mouth every other day as needed for erectile dysfunction.  . [DISCONTINUED] amLODipine (NORVASC) 10 MG tablet TAKE 1 TABLET BY MOUTH EVERY DAY  . [DISCONTINUED] atorvastatin (LIPITOR) 40 MG tablet Take 0.5 tablets (20  mg total) by mouth daily. 1/2 tablet daily  . [DISCONTINUED] hydrochlorothiazide (HYDRODIURIL) 25 MG tablet TAKE 1 TABLET BY MOUTH EVERY DAY  . [DISCONTINUED] losartan (COZAAR) 100 MG tablet TAKE 1 TABLET BY MOUTH EVERY DAY  . [DISCONTINUED] amLODipine (NORVASC) 10 MG tablet Take 1 tablet (10 mg total) by mouth daily.   No facility-administered encounter medications on file as of 03/08/2020.   Patient Active Problem List   Diagnosis Date Noted  . Encounter for screening colonoscopy   . Advanced care planning/counseling discussion 06/19/2017  . BPH (benign prostatic hyperplasia) 06/19/2017  . Allergic rhinitis, seasonal 06/19/2017  . Environmental and seasonal allergies 09/20/2016  . Elevated PSA 04/23/2016  . Hypertension   . Hyperlipidemia    Past Medical History:  Diagnosis Date  . Hyperlipidemia   . Hypertension   . Thyroid disease    Grave's disease   Relevant past medical, surgical, family and social history reviewed and updated as indicated. Interim medical history since our last visit reviewed. Allergies and medications reviewed and updated.  Review of Systems  Constitutional: Negative.  Negative for activity change, appetite change, diaphoresis, fatigue and fever.  Eyes: Negative.  Negative for visual disturbance.  Respiratory: Negative.  Negative for cough, shortness of breath and wheezing.   Cardiovascular: Negative.  Negative for chest pain, palpitations and leg swelling.  Gastrointestinal: Negative.  Negative for abdominal pain, blood in stool, constipation, diarrhea, nausea and vomiting.  Endocrine: Negative.   Musculoskeletal: Negative.   Skin: Negative.  Negative for rash.  Neurological: Negative.  Negative for dizziness, weakness, light-headedness and headaches.  Psychiatric/Behavioral: Negative.  Negative for confusion, self-injury and sleep disturbance.    Per HPI unless specifically indicated above     Objective:    BP 128/73   Pulse 77   Temp 98.4  F (36.9 C) (Oral)   Wt 206 lb 9.6 oz (93.7 kg)   SpO2 96%   BMI 29.64 kg/m   Wt Readings from Last 3 Encounters:  03/08/20 206 lb 9.6 oz (93.7 kg)  09/02/19 197 lb (89.4 kg)  08/31/19 197 lb (89.4 kg)    Physical Exam Vitals and nursing note reviewed.  Constitutional:      General: He is not in acute distress.    Appearance: Normal appearance.  Eyes:     General: No scleral icterus.    Extraocular Movements: Extraocular movements intact.  Neck:     Vascular: No carotid bruit.  Cardiovascular:     Rate and Rhythm: Normal rate.     Heart sounds: Normal heart sounds. No murmur heard.   Pulmonary:     Effort: Pulmonary effort is normal. No respiratory distress.     Breath sounds: Normal breath sounds. No wheezing or rhonchi.  Abdominal:     General: Abdomen is flat. Bowel sounds are normal. There is no distension.  Musculoskeletal:        General: Normal range of motion.     Cervical back: Normal range of motion.     Right lower leg: No edema.     Left lower leg: No edema.  Skin:    General: Skin is warm and dry.     Coloration: Skin is not jaundiced or pale.     Findings: No erythema or rash.  Neurological:     General: No focal deficit present.     Mental Status: He is alert and oriented to person, place, and time.     Motor: No weakness.     Gait: Gait normal.  Psychiatric:        Mood and Affect: Mood normal.        Behavior: Behavior normal.        Thought Content: Thought content normal.        Judgment: Judgment normal.     Results for orders placed or performed in visit on 03/07/20  PSA  Result Value Ref Range   Prostate Specific Ag, Serum 6.6 (H) 0.0 - 4.0 ng/mL      Assessment & Plan:   Problem List Items Addressed This Visit      Cardiovascular and Mediastinum   Hypertension    Chronic, stable on amlodipine 10 mg, losartan 100 mg, and hydrochlorothiazide 25 mg.  We will continue these medications for now, refills given.  CMP checked today.   Patient to occasionally monitor blood pressure at home and notify clinic if sustaining greater than 140/90.  Follow-up in 6 months or sooner if needs arise.      Relevant Medications   amLODipine (NORVASC) 10 MG tablet   atorvastatin (LIPITOR) 40 MG tablet   hydrochlorothiazide (HYDRODIURIL) 25 MG tablet   losartan (COZAAR) 100 MG tablet   Other Relevant Orders   Comprehensive metabolic panel   Lipid Panel w/o Chol/HDL Ratio     Other   Hyperlipidemia - Primary    Chronic, previously stable on atorvastatin 20 mg daily.  We will continue this medication for now and check lipids today.  CMP also checked today.  Will adjust medication as needed.  Follow-up in 6 months.      Relevant Medications   amLODipine (NORVASC) 10 MG tablet   atorvastatin (LIPITOR) 40 MG tablet   hydrochlorothiazide (HYDRODIURIL) 25 MG tablet   losartan (COZAAR) 100 MG tablet   Other Relevant Orders   Comprehensive metabolic panel   Lipid Panel w/o Chol/HDL Ratio    Other Visit Diagnoses    Elevated glucose       Relevant Orders   HgB A1c       Follow up plan: Return in about 6 months (around 09/06/2020) for htn,hld f/u.

## 2020-03-08 NOTE — Patient Instructions (Signed)
DASH Eating Plan DASH stands for "Dietary Approaches to Stop Hypertension." The DASH eating plan is a healthy eating plan that has been shown to reduce high blood pressure (hypertension). It may also reduce your risk for type 2 diabetes, heart disease, and stroke. The DASH eating plan may also help with weight loss. What are tips for following this plan?  General guidelines  Avoid eating more than 2,300 mg (milligrams) of salt (sodium) a day. If you have hypertension, you may need to reduce your sodium intake to 1,500 mg a day.  Limit alcohol intake to no more than 1 drink a day for nonpregnant women and 2 drinks a day for men. One drink equals 12 oz of beer, 5 oz of wine, or 1 oz of hard liquor.  Work with your health care provider to maintain a healthy body weight or to lose weight. Ask what an ideal weight is for you.  Get at least 30 minutes of exercise that causes your heart to beat faster (aerobic exercise) most days of the week. Activities may include walking, swimming, or biking.  Work with your health care provider or diet and nutrition specialist (dietitian) to adjust your eating plan to your individual calorie needs. Reading food labels   Check food labels for the amount of sodium per serving. Choose foods with less than 5 percent of the Daily Value of sodium. Generally, foods with less than 300 mg of sodium per serving fit into this eating plan.  To find whole grains, look for the word "whole" as the first word in the ingredient list. Shopping  Buy products labeled as "low-sodium" or "no salt added."  Buy fresh foods. Avoid canned foods and premade or frozen meals. Cooking  Avoid adding salt when cooking. Use salt-free seasonings or herbs instead of table salt or sea salt. Check with your health care provider or pharmacist before using salt substitutes.  Do not fry foods. Cook foods using healthy methods such as baking, boiling, grilling, and broiling instead.  Cook with  heart-healthy oils, such as olive, canola, soybean, or sunflower oil. Meal planning  Eat a balanced diet that includes: ? 5 or more servings of fruits and vegetables each day. At each meal, try to fill half of your plate with fruits and vegetables. ? Up to 6-8 servings of whole grains each day. ? Less than 6 oz of lean meat, poultry, or fish each day. A 3-oz serving of meat is about the same size as a deck of cards. One egg equals 1 oz. ? 2 servings of low-fat dairy each day. ? A serving of nuts, seeds, or beans 5 times each week. ? Heart-healthy fats. Healthy fats called Omega-3 fatty acids are found in foods such as flaxseeds and coldwater fish, like sardines, salmon, and mackerel.  Limit how much you eat of the following: ? Canned or prepackaged foods. ? Food that is high in trans fat, such as fried foods. ? Food that is high in saturated fat, such as fatty meat. ? Sweets, desserts, sugary drinks, and other foods with added sugar. ? Full-fat dairy products.  Do not salt foods before eating.  Try to eat at least 2 vegetarian meals each week.  Eat more home-cooked food and less restaurant, buffet, and fast food.  When eating at a restaurant, ask that your food be prepared with less salt or no salt, if possible. What foods are recommended? The items listed may not be a complete list. Talk with your dietitian about   what dietary choices are best for you. Grains Whole-grain or whole-wheat bread. Whole-grain or whole-wheat pasta. Brown rice. Oatmeal. Quinoa. Bulgur. Whole-grain and low-sodium cereals. Pita bread. Low-fat, low-sodium crackers. Whole-wheat flour tortillas. Vegetables Fresh or frozen vegetables (raw, steamed, roasted, or grilled). Low-sodium or reduced-sodium tomato and vegetable juice. Low-sodium or reduced-sodium tomato sauce and tomato paste. Low-sodium or reduced-sodium canned vegetables. Fruits All fresh, dried, or frozen fruit. Canned fruit in natural juice (without  added sugar). Meat and other protein foods Skinless chicken or turkey. Ground chicken or turkey. Pork with fat trimmed off. Fish and seafood. Egg whites. Dried beans, peas, or lentils. Unsalted nuts, nut butters, and seeds. Unsalted canned beans. Lean cuts of beef with fat trimmed off. Low-sodium, lean deli meat. Dairy Low-fat (1%) or fat-free (skim) milk. Fat-free, low-fat, or reduced-fat cheeses. Nonfat, low-sodium ricotta or cottage cheese. Low-fat or nonfat yogurt. Low-fat, low-sodium cheese. Fats and oils Soft margarine without trans fats. Vegetable oil. Low-fat, reduced-fat, or light mayonnaise and salad dressings (reduced-sodium). Canola, safflower, olive, soybean, and sunflower oils. Avocado. Seasoning and other foods Herbs. Spices. Seasoning mixes without salt. Unsalted popcorn and pretzels. Fat-free sweets. What foods are not recommended? The items listed may not be a complete list. Talk with your dietitian about what dietary choices are best for you. Grains Baked goods made with fat, such as croissants, muffins, or some breads. Dry pasta or rice meal packs. Vegetables Creamed or fried vegetables. Vegetables in a cheese sauce. Regular canned vegetables (not low-sodium or reduced-sodium). Regular canned tomato sauce and paste (not low-sodium or reduced-sodium). Regular tomato and vegetable juice (not low-sodium or reduced-sodium). Pickles. Olives. Fruits Canned fruit in a light or heavy syrup. Fried fruit. Fruit in cream or butter sauce. Meat and other protein foods Fatty cuts of meat. Ribs. Fried meat. Bacon. Sausage. Bologna and other processed lunch meats. Salami. Fatback. Hotdogs. Bratwurst. Salted nuts and seeds. Canned beans with added salt. Canned or smoked fish. Whole eggs or egg yolks. Chicken or turkey with skin. Dairy Whole or 2% milk, cream, and half-and-half. Whole or full-fat cream cheese. Whole-fat or sweetened yogurt. Full-fat cheese. Nondairy creamers. Whipped toppings.  Processed cheese and cheese spreads. Fats and oils Butter. Stick margarine. Lard. Shortening. Ghee. Bacon fat. Tropical oils, such as coconut, palm kernel, or palm oil. Seasoning and other foods Salted popcorn and pretzels. Onion salt, garlic salt, seasoned salt, table salt, and sea salt. Worcestershire sauce. Tartar sauce. Barbecue sauce. Teriyaki sauce. Soy sauce, including reduced-sodium. Steak sauce. Canned and packaged gravies. Fish sauce. Oyster sauce. Cocktail sauce. Horseradish that you find on the shelf. Ketchup. Mustard. Meat flavorings and tenderizers. Bouillon cubes. Hot sauce and Tabasco sauce. Premade or packaged marinades. Premade or packaged taco seasonings. Relishes. Regular salad dressings. Where to find more information:  National Heart, Lung, and Blood Institute: www.nhlbi.nih.gov  American Heart Association: www.heart.org Summary  The DASH eating plan is a healthy eating plan that has been shown to reduce high blood pressure (hypertension). It may also reduce your risk for type 2 diabetes, heart disease, and stroke.  With the DASH eating plan, you should limit salt (sodium) intake to 2,300 mg a day. If you have hypertension, you may need to reduce your sodium intake to 1,500 mg a day.  When on the DASH eating plan, aim to eat more fresh fruits and vegetables, whole grains, lean proteins, low-fat dairy, and heart-healthy fats.  Work with your health care provider or diet and nutrition specialist (dietitian) to adjust your eating plan to your   individual calorie needs. This information is not intended to replace advice given to you by your health care provider. Make sure you discuss any questions you have with your health care provider. Document Revised: 04/26/2017 Document Reviewed: 05/07/2016 Elsevier Patient Education  2020 Elsevier Inc.  

## 2020-03-08 NOTE — Assessment & Plan Note (Signed)
Chronic, previously stable on atorvastatin 20 mg daily.  We will continue this medication for now and check lipids today.  CMP also checked today.  Will adjust medication as needed.  Follow-up in 6 months.

## 2020-03-08 NOTE — Assessment & Plan Note (Signed)
Chronic, stable on amlodipine 10 mg, losartan 100 mg, and hydrochlorothiazide 25 mg.  We will continue these medications for now, refills given.  CMP checked today.  Patient to occasionally monitor blood pressure at home and notify clinic if sustaining greater than 140/90.  Follow-up in 6 months or sooner if needs arise.

## 2020-03-09 ENCOUNTER — Telehealth: Payer: Self-pay

## 2020-03-09 ENCOUNTER — Other Ambulatory Visit: Payer: Self-pay

## 2020-03-09 ENCOUNTER — Encounter: Payer: Self-pay | Admitting: Nurse Practitioner

## 2020-03-09 DIAGNOSIS — E785 Hyperlipidemia, unspecified: Secondary | ICD-10-CM | POA: Insufficient documentation

## 2020-03-09 DIAGNOSIS — E1159 Type 2 diabetes mellitus with other circulatory complications: Secondary | ICD-10-CM | POA: Insufficient documentation

## 2020-03-09 DIAGNOSIS — I152 Hypertension secondary to endocrine disorders: Secondary | ICD-10-CM | POA: Insufficient documentation

## 2020-03-09 DIAGNOSIS — E1169 Type 2 diabetes mellitus with other specified complication: Secondary | ICD-10-CM

## 2020-03-09 HISTORY — DX: Type 2 diabetes mellitus with other specified complication: E11.69

## 2020-03-09 HISTORY — DX: Hyperlipidemia, unspecified: E78.5

## 2020-03-09 LAB — COMPREHENSIVE METABOLIC PANEL
ALT: 20 IU/L (ref 0–44)
AST: 26 IU/L (ref 0–40)
Albumin/Globulin Ratio: 1.9 (ref 1.2–2.2)
Albumin: 4.7 g/dL (ref 3.8–4.8)
Alkaline Phosphatase: 58 IU/L (ref 44–121)
BUN/Creatinine Ratio: 17 (ref 10–24)
BUN: 21 mg/dL (ref 8–27)
Bilirubin Total: 0.3 mg/dL (ref 0.0–1.2)
CO2: 20 mmol/L (ref 20–29)
Calcium: 9.5 mg/dL (ref 8.6–10.2)
Chloride: 102 mmol/L (ref 96–106)
Creatinine, Ser: 1.21 mg/dL (ref 0.76–1.27)
GFR calc Af Amer: 71 mL/min/{1.73_m2} (ref >59)
GFR calc non Af Amer: 61 mL/min/{1.73_m2} (ref >59)
Globulin, Total: 2.5 g/dL (ref 1.5–4.5)
Glucose: 95 mg/dL (ref 65–99)
Potassium: 3.9 mmol/L (ref 3.5–5.2)
Sodium: 140 mmol/L (ref 134–144)
Total Protein: 7.2 g/dL (ref 6.0–8.5)

## 2020-03-09 LAB — LIPID PANEL W/O CHOL/HDL RATIO
Cholesterol, Total: 198 mg/dL (ref 100–199)
HDL: 47 mg/dL (ref >39)
LDL Chol Calc (NIH): 116 mg/dL — ABNORMAL HIGH (ref 0–99)
Triglycerides: 202 mg/dL — ABNORMAL HIGH (ref 0–149)
VLDL Cholesterol Cal: 35 mg/dL (ref 5–40)

## 2020-03-09 LAB — HEMOGLOBIN A1C
Est. average glucose Bld gHb Est-mCnc: 140 mg/dL
Hgb A1c MFr Bld: 6.5 % — ABNORMAL HIGH (ref 4.8–5.6)

## 2020-03-09 NOTE — Telephone Encounter (Signed)
-----   Message from [name], MD sent at 03/09/2020 10:14 AM EDT ----- PSA is back up.  Please schedule him a follow-up to discuss options,.  [name], MD

## 2020-03-09 NOTE — Telephone Encounter (Signed)
Pt called back. appt scheduled for next week.

## 2020-03-09 NOTE — Progress Notes (Signed)
Noted, thank you

## 2020-03-09 NOTE — Telephone Encounter (Signed)
Left message for patient to call office to schedule follow up appointment.
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
## 2020-03-16 ENCOUNTER — Other Ambulatory Visit: Payer: Self-pay

## 2020-03-16 ENCOUNTER — Ambulatory Visit: Payer: Medicare HMO | Admitting: Urology

## 2020-03-16 VITALS — BP 146/96 | HR 80

## 2020-03-16 DIAGNOSIS — N5203 Combined arterial insufficiency and corporo-venous occlusive erectile dysfunction: Secondary | ICD-10-CM

## 2020-03-16 DIAGNOSIS — R972 Elevated prostate specific antigen [PSA]: Secondary | ICD-10-CM | POA: Diagnosis not present

## 2020-03-16 NOTE — Patient Instructions (Signed)

## 2020-03-30 ENCOUNTER — Other Ambulatory Visit: Payer: Self-pay | Admitting: Urology

## 2020-03-30 ENCOUNTER — Encounter: Payer: Self-pay | Admitting: *Deleted

## 2020-03-30 ENCOUNTER — Other Ambulatory Visit: Payer: Self-pay

## 2020-03-30 ENCOUNTER — Ambulatory Visit (INDEPENDENT_AMBULATORY_CARE_PROVIDER_SITE_OTHER): Payer: Medicare HMO | Admitting: Urology

## 2020-03-30 ENCOUNTER — Emergency Department: Payer: Medicare HMO

## 2020-03-30 ENCOUNTER — Emergency Department
Admission: EM | Admit: 2020-03-30 | Discharge: 2020-03-30 | Disposition: A | Discharge status: Home / Self Care | Payer: Medicare HMO | Attending: Emergency Medicine | Admitting: Emergency Medicine

## 2020-03-30 DIAGNOSIS — R972 Elevated prostate specific antigen [PSA]: Secondary | ICD-10-CM | POA: Diagnosis not present

## 2020-03-30 DIAGNOSIS — R55 Syncope and collapse: Secondary | ICD-10-CM | POA: Diagnosis not present

## 2020-03-30 DIAGNOSIS — Z79899 Other long term (current) drug therapy: Secondary | ICD-10-CM | POA: Diagnosis not present

## 2020-03-30 DIAGNOSIS — E119 Type 2 diabetes mellitus without complications: Secondary | ICD-10-CM | POA: Diagnosis not present

## 2020-03-30 DIAGNOSIS — I10 Essential (primary) hypertension: Secondary | ICD-10-CM | POA: Insufficient documentation

## 2020-03-30 DIAGNOSIS — Z87891 Personal history of nicotine dependence: Secondary | ICD-10-CM | POA: Diagnosis not present

## 2020-03-30 DIAGNOSIS — Z7982 Long term (current) use of aspirin: Secondary | ICD-10-CM | POA: Insufficient documentation

## 2020-03-30 LAB — BASIC METABOLIC PANEL
Anion gap: 14 (ref 5–15)
BUN: 22 mg/dL (ref 8–23)
CO2: 21 mmol/L — ABNORMAL LOW (ref 22–32)
Calcium: 8.8 mg/dL — ABNORMAL LOW (ref 8.9–10.3)
Chloride: 101 mmol/L (ref 98–111)
Creatinine, Ser: 1.24 mg/dL (ref 0.61–1.24)
GFR, Estimated: >60 mL/min (ref >60)
Glucose, Bld: 254 mg/dL — ABNORMAL HIGH (ref 70–99)
Potassium: 3.2 mmol/L — ABNORMAL LOW (ref 3.5–5.1)
Sodium: 136 mmol/L (ref 135–145)

## 2020-03-30 LAB — CBC
HCT: 45.7 % (ref 39.0–52.0)
Hemoglobin: 15.3 g/dL (ref 13.0–17.0)
MCH: 26.9 pg (ref 26.0–34.0)
MCHC: 33.5 g/dL (ref 30.0–36.0)
MCV: 80.5 fL (ref 80.0–100.0)
Platelets: 209 10*3/uL (ref 150–400)
RBC: 5.68 MIL/uL (ref 4.22–5.81)
RDW: 14.1 % (ref 11.5–15.5)
WBC: 12.3 10*3/uL — ABNORMAL HIGH (ref 4.0–10.5)
nRBC: 0 % (ref 0.0–0.2)

## 2020-03-30 LAB — TROPONIN I (HIGH SENSITIVITY)
Troponin I (High Sensitivity): 4 ng/L (ref <18)
Troponin I (High Sensitivity): 3 ng/L (ref <18)

## 2020-03-30 MED ORDER — POTASSIUM CHLORIDE CRYS ER 20 MEQ PO TBCR
40.0000 meq | EXTENDED_RELEASE_TABLET | Freq: Once | ORAL | Status: AC
  Administered 2020-03-30: 40 meq via ORAL
  Filled 2020-03-30: qty 2

## 2020-03-30 MED ORDER — GENTAMICIN SULFATE 40 MG/ML IJ SOLN
80.0000 mg | Freq: Once | INTRAMUSCULAR | Status: AC
  Administered 2020-03-30: 80 mg via INTRAMUSCULAR

## 2020-03-30 MED ORDER — LEVOFLOXACIN 500 MG PO TABS
500.0000 mg | ORAL_TABLET | Freq: Once | ORAL | Status: AC
  Administered 2020-03-30: 500 mg via ORAL

## 2020-03-30 MED ORDER — ONDANSETRON HCL 4 MG/2ML IJ SOLN
4.0000 mg | Freq: Once | INTRAMUSCULAR | Status: AC
  Administered 2020-03-30: 4 mg via INTRAVENOUS
  Filled 2020-03-30: qty 2

## 2020-03-30 NOTE — Discharge Instructions (Addendum)
Please seek medical attention for any high fevers, chest pain, shortness of breath, change in behavior, persistent vomiting, bloody stool or any other new or concerning symptoms.  

## 2020-03-30 NOTE — ED Notes (Signed)
Report off to gracie  rn  

## 2020-03-30 NOTE — ED Notes (Signed)
Pt eating dinner tray °

## 2020-03-30 NOTE — ED Notes (Signed)
meds given  Labs sent  Family with pt   Pt alert.  nsr on monitor.

## 2020-03-30 NOTE — ED Triage Notes (Signed)
Pt brought in via ems from a car.  Pt had a biopsy done today and felt bad afterwards, called 911 from the car.  Pt had a seizure in triage.  Pt has urinary incontinence.  Iv in place.  No hx seizures.

## 2020-03-30 NOTE — ED Notes (Signed)
Pt reports feeling bad after having a biopsy done today.  In triage, pt had a seizure with urinary incontinence.  No headache, did not bite tongue.  nsr on monitor.  Iv in place.  Seizure blankets in place on bed rails.  Pt alert, speech clear. s

## 2020-03-30 NOTE — ED Triage Notes (Signed)
Pt comes into the ED via [facility] where he was picked up in his car for syncopal episode.  Pt has no known h/o of this.  CBG 219, h/o HTN 117/69.  20 L forearm, with IVF running.
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
## 2020-03-30 NOTE — ED Notes (Signed)
Pt began having a seizure in triage room. Lasted about 45 seconds. Pt urinated on himself. Pt states that he has hx of seizures.

## 2020-03-30 NOTE — Progress Notes (Signed)
Prostate Biopsy Procedure   Informed consent was obtained after discussing risks/benefits of the procedure.  A time out was performed to ensure correct patient identity.  Pre-Procedure: - Last PSA Level: 6.6 ng/mL on 03/07/2020 - Gentamicin given prophylactically - Levaquin 500 mg administered PO -Transrectal Ultrasound performed revealing a 66.18 gm prostate -No significant hypoechoic or median lobe noted  Procedure: - Prostate block performed using 10 cc 1% lidocaine and biopsies taken from sextant areas, a total of 12 under ultrasound guidance.  Post-Procedure: - Patient tolerated the procedure well - He was counseled to seek immediate medical attention if experiences any severe pain, significant bleeding, or fevers - Return in one week to discuss biopsy results  I, Selena Batten, am acting as a scribe for Dr. [name].  I have reviewed the above documentation for accuracy and completeness, and I agree with the above.   [name], MD

## 2020-03-30 NOTE — ED Notes (Signed)
Pt states he is feeling better.  Pt alert.  Family with pt.  nsr on monitor.

## 2020-03-31 ENCOUNTER — Telehealth: Payer: Self-pay | Admitting: Family Medicine

## 2020-03-31 NOTE — Telephone Encounter (Signed)
Pt went to the ER due to vomiiting, nausea and light headedness / They advised him of low metabolism / Pt Wanted Dr Wynetta Emery to Know

## 2020-03-31 NOTE — Addendum Note (Signed)
Addended by: Verlene Mayer A on: 03/31/2020 10:38 AM   Modules accepted: Orders

## 2020-03-31 NOTE — Telephone Encounter (Signed)
Noted  

## 2020-03-31 NOTE — Telephone Encounter (Signed)
FYI

## 2020-04-04 LAB — SURGICAL PATHOLOGY

## 2020-04-05 ENCOUNTER — Other Ambulatory Visit: Payer: Self-pay

## 2020-04-05 ENCOUNTER — Encounter: Payer: Self-pay | Admitting: Urology

## 2020-04-05 ENCOUNTER — Ambulatory Visit (INDEPENDENT_AMBULATORY_CARE_PROVIDER_SITE_OTHER): Payer: Medicare HMO | Admitting: Urology

## 2020-04-05 VITALS — BP 139/84 | HR 77

## 2020-04-05 DIAGNOSIS — R972 Elevated prostate specific antigen [PSA]: Secondary | ICD-10-CM | POA: Diagnosis not present

## 2020-04-05 DIAGNOSIS — N400 Enlarged prostate without lower urinary tract symptoms: Secondary | ICD-10-CM
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
## 2020-04-18 ENCOUNTER — Ambulatory Visit: Payer: Self-pay

## 2020-04-18 ENCOUNTER — Ambulatory Visit: Payer: Medicare HMO | Attending: Internal Medicine

## 2020-04-18 DIAGNOSIS — Z23 Encounter for immunization: Secondary | ICD-10-CM

## 2020-04-18 NOTE — Progress Notes (Signed)
   Covid-19 Vaccination Clinic  Name:  JAIDYN KUHL    MRN: [id number] DOB: 04-20-1951  04/18/2020  Mr. Witts was observed post Covid-19 immunization for 15 minutes without incident. He was provided with Vaccine Information Sheet and instruction to access the V-Safe system.   Mr. Lopezperez was instructed to call 911 with any severe reactions post vaccine: Marland Kitchen Difficulty breathing  . Swelling of face and throat  . A fast heartbeat  . A bad rash all over body  . Dizziness and weakness   Immunizations Administered    No immunizations on file.

## 2020-04-19 ENCOUNTER — Encounter: Payer: Self-pay | Admitting: Nurse Practitioner

## 2020-04-19 ENCOUNTER — Other Ambulatory Visit: Payer: Self-pay

## 2020-04-19 ENCOUNTER — Ambulatory Visit (INDEPENDENT_AMBULATORY_CARE_PROVIDER_SITE_OTHER): Payer: Medicare HMO | Admitting: Nurse Practitioner

## 2020-04-19 VITALS — BP 126/77 | HR 77 | Temp 97.7°F | Ht 69.29 in | Wt 206.8 lb

## 2020-04-19 DIAGNOSIS — R252 Cramp and spasm: Secondary | ICD-10-CM

## 2020-04-19 MED ORDER — TIZANIDINE HCL 4 MG PO TABS: 4.0000 mg | ORAL_TABLET | Freq: Every day | ORAL | 0 refills | Status: DC

## 2020-04-19 MED ORDER — TADALAFIL 20 MG PO TABS
10.0000 mg | ORAL_TABLET | ORAL | 11 refills | Status: DC | PRN
Start: 2020-04-19 — End: 2020-04-25

## 2020-04-19 NOTE — Assessment & Plan Note (Signed)
Acute, ongoing.  Will treat with zanaflex qhs prn cramping.  Also discussed possibility of low potassium as in most recent lab check.  If stays low, will replace with potassium.  Can also try mustard/vinegar with onset of cramping.  If cramping continues, return to office.

## 2020-04-19 NOTE — Patient Instructions (Signed)
Leg Cramps Leg cramps occur when one or more muscles tighten and you have no control over this tightening (involuntary muscle contraction). Muscle cramps can develop in any muscle, but the most common place is in the calf muscles of the leg. Those cramps can occur during exercise or when you are at rest. Leg cramps are painful, and they may last for a few seconds to a few minutes. Cramps may return several times before they finally stop. Usually, leg cramps are not caused by a serious medical problem. In many cases, the cause is not known. Some common causes include:  Excessive physical effort (overexertion), such as during intense exercise.  Overuse from repetitive motions, or doing the same thing over and over.  Staying in a certain position for a long period of time.  Improper preparation, form, or technique while performing a sport or an activity.  Dehydration.  Injury.  Side effects of certain medicines.  Abnormally low levels of minerals in your blood (electrolytes), especially potassium and calcium. This could result from: ? Pregnancy. ? Taking diuretic medicines. Follow these instructions at home: Eating and drinking  Drink enough fluid to keep your urine pale yellow. Staying hydrated may help prevent cramps.  Eat a healthy diet that includes plenty of nutrients to help your muscles function. A healthy diet includes fruits and vegetables, lean protein, whole grains, and low-fat or nonfat dairy products. Managing pain, stiffness, and swelling      Try massaging, stretching, and relaxing the affected muscle. Do this for several minutes at a time.  If directed, put ice on areas that are sore or painful after a cramp: ? Put ice in a plastic bag. ? Place a towel between your skin and the bag. ? Leave the ice on for 20 minutes, 2-3 times a day.  If directed, apply heat to muscles that are tense or tight. Do this before you exercise, or as often as told by your health care  provider. Use the heat source that your health care provider recommends, such as a moist heat pack or a heating pad. ? Place a towel between your skin and the heat source. ? Leave the heat on for 20-30 minutes. ? Remove the heat if your skin turns bright red. This is especially important if you are unable to feel pain, heat, or cold. You may have a greater risk of getting burned.  Try taking hot showers or baths to help relax tight muscles. General instructions  If you are having frequent leg cramps, avoid intense exercise for several days.  Take over-the-counter and prescription medicines only as told by your health care provider.  Keep all follow-up visits as told by your health care provider. This is important. Contact a health care provider if:  Your leg cramps get more severe or more frequent, or they do not improve over time.  Your foot becomes cold, numb, or blue. Summary  Muscle cramps can develop in any muscle, but the most common place is in the calf muscles of the leg.  Leg cramps are painful, and they may last for a few seconds to a few minutes.  Usually, leg cramps are not caused by a serious medical problem. Often, the cause is not known.  Stay hydrated and take over-the-counter and prescription medicines only as told by your health care provider. This information is not intended to replace advice given to you by your health care provider. Make sure you discuss any questions you have with your health care  provider. Document Revised: 04/26/2017 Document Reviewed: 02/21/2017 Elsevier Patient Education  2020 [employer].

## 2020-04-19 NOTE — Progress Notes (Signed)
BP 126/77   Pulse 77   Temp 97.7 F (36.5 C) (Oral)   Ht 5' 9.29" (1.76 m)   Wt 206 lb 12.8 oz (93.8 kg)   SpO2 96%   BMI 30.28 kg/m    Subjective:    Patient ID: Donald Le, male    DOB: Dec 17, 1950, 69 y.o.   MRN: [id number]  HPI: Donald Le is a 69 y.o. male presenting with leg pain.  Chief Complaint  Patient presents with  . Leg Pain    right leg pain for the past 2 weeks, travels from the thigh to the lower leg.   LEG PAIN Soreness starts in top of leg and goes down to lower leg.   Duration: weeks Pain: no Severity: mild  Quality:  spasms Location:  right thigh and lower leg Bilateral:  no Onset: sudden Frequency: intermittent Time of day: at random Sudden unintentional leg jerking:   no Paresthesias:   no Decreased sensation:  no Weakness:   no Insomnia:   no Fatigue:   no Alleviating factors: turning leg side to side Aggravating factors: standing on it, walking, etc. Status: stable Treatments attempted: Tylenol, massage  Allergies  Allergen Reactions  . Ace Inhibitors Other (See Comments)    ARF   Outpatient Encounter Medications as of 04/19/2020  Medication Sig  . amLODipine (NORVASC) 10 MG tablet Take 1 tablet (10 mg total) by mouth daily.  Marland Kitchen aspirin EC 81 MG tablet Take 81 mg by mouth daily.  Marland Kitchen atorvastatin (LIPITOR) 40 MG tablet Take 0.5 tablets (20 mg total) by mouth daily. 1/2 tablet daily  . hydrochlorothiazide (HYDRODIURIL) 25 MG tablet Take 1 tablet (25 mg total) by mouth daily.  Marland Kitchen losartan (COZAAR) 100 MG tablet Take 1 tablet (100 mg total) by mouth daily.  . Misc Natural Products (PROSTATE HEALTH PO) Take by mouth.  . tadalafil (CIALIS) 20 MG tablet Take 0.5-1 tablets (10-20 mg total) by mouth every other day as needed for erectile dysfunction.  . [DISCONTINUED] tadalafil (CIALIS) 20 MG tablet Take 0.5-1 tablets (10-20 mg total) by mouth every other day as needed for erectile dysfunction.  Marland Kitchen tiZANidine (ZANAFLEX) 4 MG  tablet Take 1 tablet (4 mg total) by mouth at bedtime. Take first dose at night and monitor for drowsiness.  If this medication makes you sleepy, do not taking while driving or operating heavy machinery.   No facility-administered encounter medications on file as of 04/19/2020.   Patient Active Problem List   Diagnosis Date Noted  . Leg cramp 04/19/2020  . Hyperlipidemia associated with type 2 diabetes mellitus ([place]) 03/09/2020  . Hypertension associated with diabetes ([place]) 03/09/2020  . Encounter for screening colonoscopy   . Advanced care planning/counseling discussion 06/19/2017  . BPH (benign prostatic hyperplasia) 06/19/2017  . Allergic rhinitis, seasonal 06/19/2017  . Environmental and seasonal allergies 09/20/2016  . Elevated PSA 04/23/2016  . Hypertension   . Hyperlipidemia    Past Medical History:  Diagnosis Date  . Hyperlipidemia   . Hyperlipidemia associated with type 2 diabetes mellitus ([place]) 03/09/2020  . Hypertension   . Thyroid disease    Grave's disease   Relevant past medical, surgical, family and social history reviewed and updated as indicated. Interim medical history since our last visit reviewed.  Review of Systems  Constitutional: Negative.  Negative for fatigue.  Eyes: Negative.  Negative for visual disturbance.  Musculoskeletal: Positive for myalgias. Negative for arthralgias, back pain, gait problem and joint swelling.  Skin: Negative.  Negative for color change and rash.  Neurological: Negative.  Negative for dizziness, weakness, light-headedness and headaches.  Psychiatric/Behavioral: Negative.  Negative for confusion and sleep disturbance. The patient is not nervous/anxious.     Per HPI unless specifically indicated above     Objective:    BP 126/77   Pulse 77   Temp 97.7 F (36.5 C) (Oral)   Ht 5' 9.29" (1.76 m)   Wt 206 lb 12.8 oz (93.8 kg)   SpO2 96%   BMI 30.28 kg/m   Wt Readings from Last 3 Encounters:  04/19/20 206 lb 12.8 oz (93.8  kg)  03/30/20 206 lb (93.4 kg)  03/08/20 206 lb 9.6 oz (93.7 kg)    Physical Exam Vitals and nursing note reviewed.  Constitutional:      General: He is not in acute distress.    Appearance: Normal appearance. He is not toxic-appearing.  Eyes:     General: No scleral icterus.    Extraocular Movements: Extraocular movements intact.  Musculoskeletal:        General: Normal range of motion.     Right upper leg: Normal. No swelling, lacerations, tenderness or bony tenderness.     Right knee: Normal. No swelling, deformity, effusion, erythema or bony tenderness. Normal range of motion.     Right lower leg: Normal. No deformity, tenderness or bony tenderness. No edema.     Left lower leg: No deformity, tenderness or bony tenderness.  Skin:    General: Skin is warm and dry.     Capillary Refill: Capillary refill takes less than 2 seconds.     Coloration: Skin is not jaundiced or pale.     Findings: No erythema.  Neurological:     General: No focal deficit present.     Mental Status: He is alert and oriented to person, place, and time.     Sensory: No sensory deficit.     Motor: No weakness.     Gait: Gait normal.  Psychiatric:        Mood and Affect: Mood normal.        Behavior: Behavior normal.        Thought Content: Thought content normal.        Judgment: Judgment normal.        Assessment & Plan:   Problem List Items Addressed This Visit      Other   Leg cramp - Primary    Acute, ongoing.  Will treat with zanaflex qhs prn cramping.  Also discussed possibility of low potassium as in most recent lab check.  If stays low, will replace with potassium.  Can also try mustard/vinegar with onset of cramping.  If cramping continues, return to office.      Relevant Medications   tiZANidine (ZANAFLEX) 4 MG tablet   Other Relevant Orders   Basic Metabolic Panel (BMET)       Follow up plan: Return if symptoms worsen or fail to improve.

## 2020-04-20 LAB — BASIC METABOLIC PANEL
BUN/Creatinine Ratio: 14 (ref 10–24)
BUN: 18 mg/dL (ref 8–27)
CO2: 21 mmol/L (ref 20–29)
Calcium: 9.3 mg/dL (ref 8.6–10.2)
Chloride: 99 mmol/L (ref 96–106)
Creatinine, Ser: 1.29 mg/dL — ABNORMAL HIGH (ref 0.76–1.27)
GFR calc Af Amer: 65 mL/min/{1.73_m2} (ref >59)
GFR calc non Af Amer: 57 mL/min/{1.73_m2} — ABNORMAL LOW (ref >59)
Glucose: 103 mg/dL — ABNORMAL HIGH (ref 65–99)
Potassium: 4.1 mmol/L (ref 3.5–5.2)
Sodium: 137 mmol/L (ref 134–144)

## 2020-04-25 ENCOUNTER — Telehealth: Payer: Self-pay | Admitting: Family Medicine

## 2020-04-25 MED ORDER — TADALAFIL 10 MG PO TABS: 10.0000 mg | ORAL_TABLET | Freq: Every day | ORAL | 12 refills | Status: DC | PRN

## 2020-04-25 NOTE — Telephone Encounter (Signed)
tadalafil (CIALIS) 20 MG tablet 5 tablet 11 04/19/2020    Sig - Route: Take 0.5-1 tablets (10-20 mg total) by mouth every other day as needed for erectile dysfunction. - Oral   Class: Print    Pt states script  Is wrong, he was not wanting the 20 mg, before he had lower dosage multiple times a day. One 20 mg gives him terrible indigestion. Wants to take as always has taken, too much medication for him. See previous dosages  [id number] - [place], [place] - [address] Phone:  [phone]  Fax:  [phone]

## 2020-04-25 NOTE — Telephone Encounter (Signed)
He states that the tablets are very bitter when broken in half, he has tried this in the past

## 2020-04-25 NOTE — Telephone Encounter (Signed)
Unfortunately that is the only dose we have in the computer. Can we please check with him/pharmacy on what he was previously taking?

## 2020-04-25 NOTE — Telephone Encounter (Signed)
Patient is requesting lower dose Rx- sent for review of script and changes to dosing

## 2020-04-25 NOTE — Telephone Encounter (Signed)
Please see previous message

## 2020-04-25 NOTE — Telephone Encounter (Signed)
He can cut his tabs in 1/2 and see if that helps.

## 2020-04-25 NOTE — Telephone Encounter (Signed)
Routing to provider  

## 2020-04-25 NOTE — Progress Notes (Signed)
End of January appt is fine

## 2020-04-25 NOTE — Telephone Encounter (Signed)
He has not been on a lower dose in a long time, but suffers severe indigestion with the 20mg , so would like a lower dose to take daily to help prevent that.

## 2020-05-12 ENCOUNTER — Other Ambulatory Visit: Payer: Self-pay

## 2020-05-12 DIAGNOSIS — R252 Cramp and spasm: Secondary | ICD-10-CM

## 2020-05-16 ENCOUNTER — Other Ambulatory Visit: Payer: Self-pay

## 2020-05-16 DIAGNOSIS — R252 Cramp and spasm: Secondary | ICD-10-CM

## 2020-05-16 MED ORDER — TIZANIDINE HCL 4 MG PO TABS: 4.0000 mg | ORAL_TABLET | Freq: Every day | ORAL | 0 refills | Status: DC

## 2020-05-30 ENCOUNTER — Other Ambulatory Visit: Payer: Self-pay | Admitting: Family Medicine

## 2020-05-30 DIAGNOSIS — R252 Cramp and spasm: Secondary | ICD-10-CM

## 2020-05-30 NOTE — Telephone Encounter (Signed)
Can patient have a 90 day supply  

## 2020-05-30 NOTE — Telephone Encounter (Signed)
Pharmacy is requesting changes to non delegated Rx- sent for review

## 2020-06-20 ENCOUNTER — Ambulatory Visit: Payer: Self-pay | Admitting: Family Medicine

## 2020-06-26 DIAGNOSIS — E118 Type 2 diabetes mellitus with unspecified complications: Secondary | ICD-10-CM | POA: Insufficient documentation

## 2020-06-26 DIAGNOSIS — E1169 Type 2 diabetes mellitus with other specified complication: Secondary | ICD-10-CM | POA: Insufficient documentation

## 2020-06-26 NOTE — Progress Notes (Signed)
BP (!) 154/80   Pulse 68   Temp 99.3 F (37.4 C) (Oral)   Wt 209 lb 12.8 oz (95.2 kg)   SpO2 98%   BMI 30.72 kg/m    Subjective:    Patient ID: Donald Le, male    DOB: 1951/04/07, 70 y.o.   MRN: [id number]  HPI: Donald Le is a 70 y.o. male  Chief Complaint  Patient presents with  . Diabetes  . Hypertension    Pt states he has not taken his blood pressure medication yet this morning   DIABETES Hypoglycemic episodes:no Polydipsia/polyuria: no Visual disturbance: no Chest pain: no Paresthesias: no Glucose Monitoring: no  Accucheck frequency: not checking Taking Insulin?: no Blood Pressure Monitoring: a few times a week Retinal Examination: Not up to Date Foot Exam: Up to Date Diabetic Education: Not Completed Pneumovax: Up to Date Influenza: Up to Date Aspirin: yes  HYPERTENSION / HYPERLIPIDEMIA- took his meds yesterday AM, has not taken it today Satisfied with current treatment? yes Duration of hypertension: chronic BP monitoring frequency: a few times a week BP range: 130s/70s BP medication side effects: no Past BP meds: losartan, HCTZ, amlodipine Duration of hyperlipidemia: chronic Cholesterol medication side effects: no Cholesterol supplements: none Past cholesterol medications: atorvastatin Medication compliance: excellent compliance Aspirin: yes Recent stressors: no Recurrent headaches: no Visual changes: no Palpitations: no Dyspnea: no Chest pain: no Lower extremity edema: no Dizzy/lightheaded: no  Relevant past medical, surgical, family and social history reviewed and updated as indicated. Interim medical history since our last visit reviewed. Allergies and medications reviewed and updated.  Review of Systems  Constitutional: Negative.   Respiratory: Negative.   Cardiovascular: Negative.   Gastrointestinal: Negative.   Musculoskeletal: Negative.   Psychiatric/Behavioral: Negative.     Per HPI unless specifically  indicated above     Objective:    BP (!) 154/80   Pulse 68   Temp 99.3 F (37.4 C) (Oral)   Wt 209 lb 12.8 oz (95.2 kg)   SpO2 98%   BMI 30.72 kg/m   Wt Readings from Last 3 Encounters:  06/27/20 209 lb 12.8 oz (95.2 kg)  04/19/20 206 lb 12.8 oz (93.8 kg)  03/30/20 206 lb (93.4 kg)    Physical Exam Vitals and nursing note reviewed.  Constitutional:      General: He is not in acute distress.    Appearance: Normal appearance. He is not ill-appearing, toxic-appearing or diaphoretic.  HENT:     Head: Normocephalic and atraumatic.     Right Ear: External ear normal.     Left Ear: External ear normal.     Nose: Nose normal.     Mouth/Throat:     Mouth: Mucous membranes are moist.     Pharynx: Oropharynx is clear.  Eyes:     General: No scleral icterus.       Right eye: No discharge.        Left eye: No discharge.     Extraocular Movements: Extraocular movements intact.     Conjunctiva/sclera: Conjunctivae normal.     Pupils: Pupils are equal, round, and reactive to light.  Cardiovascular:     Rate and Rhythm: Normal rate and regular rhythm.     Pulses: Normal pulses.     Heart sounds: Normal heart sounds. No murmur heard. No friction rub. No gallop.   Pulmonary:     Effort: Pulmonary effort is normal. No respiratory distress.     Breath sounds: Normal breath sounds. No stridor.  No wheezing, rhonchi or rales.  Chest:     Chest wall: No tenderness.  Musculoskeletal:        General: Normal range of motion.     Cervical back: Normal range of motion and neck supple.  Skin:    General: Skin is warm and dry.     Capillary Refill: Capillary refill takes less than 2 seconds.     Coloration: Skin is not jaundiced or pale.     Findings: No bruising, erythema, lesion or rash.  Neurological:     General: No focal deficit present.     Mental Status: He is alert and oriented to person, place, and time. Mental status is at baseline.  Psychiatric:        Mood and Affect: Mood  normal.        Behavior: Behavior normal.        Thought Content: Thought content normal.        Judgment: Judgment normal.     Results for orders placed or performed in visit on [id number]  Basic Metabolic Panel (BMET)  Result Value Ref Range   Glucose 103 (H) 65 - 99 mg/dL   BUN 18 8 - 27 mg/dL   Creatinine, Ser 1.29 (H) 0.76 - 1.27 mg/dL   GFR calc non Af Amer 57 (L) >59 mL/min/1.73   GFR calc Af Amer 65 >59 mL/min/1.73   BUN/Creatinine Ratio 14 10 - 24   Sodium 137 134 - 144 mmol/L   Potassium 4.1 3.5 - 5.2 mmol/L   Chloride 99 96 - 106 mmol/L   CO2 21 20 - 29 mmol/L   Calcium 9.3 8.6 - 10.2 mg/dL      Assessment & Plan:   Problem List Items Addressed This Visit      Cardiovascular and Mediastinum   Hypertension associated with diabetes ([place]) - Primary    Did not take his BP meds today- encouraged him to take them when he gets home. Has been running well at home. Call with any concerns. Continue to monitor.       Relevant Medications   losartan (COZAAR) 100 MG tablet   hydrochlorothiazide (HYDRODIURIL) 25 MG tablet   atorvastatin (LIPITOR) 40 MG tablet   amLODipine (NORVASC) 10 MG tablet   Other Relevant Orders   Comprehensive metabolic panel     Endocrine   Hyperlipidemia associated with type 2 diabetes mellitus ([place])    Under good control on current regimen. Continue current regimen. Continue to monitor. Call with any concerns. Refills up to date. Labs drawn today.       Relevant Medications   losartan (COZAAR) 100 MG tablet   atorvastatin (LIPITOR) 40 MG tablet   Other Relevant Orders   Comprehensive metabolic panel   Lipid Panel w/o Chol/HDL Ratio   Controlled diabetes mellitus type 2 with complications ([place])    Diagnosed last visit with A1c of 6.5- doing well today with A1c of 6.2. Continue diet and exercise. Follow up 6 months.       Relevant Medications   losartan (COZAAR) 100 MG tablet   atorvastatin (LIPITOR) 40 MG tablet   Other Relevant Orders    Bayer DCA Hb A1c Waived   Comprehensive metabolic panel    Other Visit Diagnoses    Mixed hyperlipidemia       Relevant Medications   losartan (COZAAR) 100 MG tablet   hydrochlorothiazide (HYDRODIURIL) 25 MG tablet   atorvastatin (LIPITOR) 40 MG tablet   amLODipine (NORVASC) 10 MG tablet  Primary hypertension       Relevant Medications   losartan (COZAAR) 100 MG tablet   hydrochlorothiazide (HYDRODIURIL) 25 MG tablet   atorvastatin (LIPITOR) 40 MG tablet   amLODipine (NORVASC) 10 MG tablet       Follow up plan: Return in about 6 months (around 12/25/2020) for physical.

## 2020-06-27 ENCOUNTER — Other Ambulatory Visit: Payer: Self-pay

## 2020-06-27 ENCOUNTER — Ambulatory Visit: Payer: Medicare (Managed Care) | Admitting: Family Medicine

## 2020-06-27 ENCOUNTER — Encounter: Payer: Self-pay | Admitting: Family Medicine

## 2020-06-27 VITALS — BP 154/80 | HR 68 | Temp 99.3°F | Wt 209.8 lb

## 2020-06-27 DIAGNOSIS — E1159 Type 2 diabetes mellitus with other circulatory complications: Secondary | ICD-10-CM | POA: Diagnosis not present

## 2020-06-27 DIAGNOSIS — E1169 Type 2 diabetes mellitus with other specified complication: Secondary | ICD-10-CM

## 2020-06-27 DIAGNOSIS — E118 Type 2 diabetes mellitus with unspecified complications: Secondary | ICD-10-CM

## 2020-06-27 DIAGNOSIS — I10 Essential (primary) hypertension: Secondary | ICD-10-CM

## 2020-06-27 DIAGNOSIS — E782 Mixed hyperlipidemia: Secondary | ICD-10-CM | POA: Diagnosis not present

## 2020-06-27 DIAGNOSIS — E785 Hyperlipidemia, unspecified: Secondary | ICD-10-CM

## 2020-06-27 DIAGNOSIS — I152 Hypertension secondary to endocrine disorders: Secondary | ICD-10-CM

## 2020-06-27 LAB — BAYER DCA HB A1C WAIVED: HB A1C (BAYER DCA - WAIVED): 6.2 % (ref <7.0)

## 2020-06-27 MED ORDER — ATORVASTATIN CALCIUM 40 MG PO TABS: 20.0000 mg | ORAL_TABLET | Freq: Every day | ORAL | 1 refills | Status: DC

## 2020-06-27 MED ORDER — AMLODIPINE BESYLATE 10 MG PO TABS: 10.0000 mg | ORAL_TABLET | Freq: Every day | ORAL | 1 refills | Status: DC

## 2020-06-27 MED ORDER — HYDROCHLOROTHIAZIDE 25 MG PO TABS
25.0000 mg | ORAL_TABLET | Freq: Every day | ORAL | 1 refills | Status: DC
Start: 2020-06-27 — End: 2020-08-30

## 2020-06-27 MED ORDER — LOSARTAN POTASSIUM 100 MG PO TABS
100.0000 mg | ORAL_TABLET | Freq: Every day | ORAL | 1 refills | Status: DC
Start: 2020-06-27 — End: 2020-08-30

## 2020-06-27 NOTE — Assessment & Plan Note (Signed)
Under good control on current regimen. Continue current regimen. Continue to monitor. Call with any concerns. Refills up to date. Labs drawn today.  

## 2020-06-27 NOTE — Assessment & Plan Note (Signed)
Did not take his BP meds today- encouraged him to take them when he gets home. Has been running well at home. Call with any concerns. Continue to monitor.

## 2020-06-27 NOTE — Assessment & Plan Note (Signed)
Diagnosed last visit with A1c of 6.5- doing well today with A1c of 6.2. Continue diet and exercise. Follow up 6 months.

## 2020-06-28 LAB — COMPREHENSIVE METABOLIC PANEL
ALT: 20 IU/L (ref 0–44)
AST: 20 IU/L (ref 0–40)
Albumin/Globulin Ratio: 1.6 (ref 1.2–2.2)
Albumin: 4.2 g/dL (ref 3.8–4.8)
Alkaline Phosphatase: 65 IU/L (ref 44–121)
BUN/Creatinine Ratio: 12 (ref 10–24)
BUN: 15 mg/dL (ref 8–27)
Bilirubin Total: 0.3 mg/dL (ref 0.0–1.2)
CO2: 19 mmol/L — ABNORMAL LOW (ref 20–29)
Calcium: 9.1 mg/dL (ref 8.6–10.2)
Chloride: 101 mmol/L (ref 96–106)
Creatinine, Ser: 1.25 mg/dL (ref 0.76–1.27)
GFR calc Af Amer: 68 mL/min/{1.73_m2} (ref >59)
GFR calc non Af Amer: 59 mL/min/{1.73_m2} — ABNORMAL LOW (ref >59)
Globulin, Total: 2.7 g/dL (ref 1.5–4.5)
Glucose: 214 mg/dL — ABNORMAL HIGH (ref 65–99)
Potassium: 4 mmol/L (ref 3.5–5.2)
Sodium: 138 mmol/L (ref 134–144)
Total Protein: 6.9 g/dL (ref 6.0–8.5)

## 2020-06-28 LAB — LIPID PANEL W/O CHOL/HDL RATIO
Cholesterol, Total: 151 mg/dL (ref 100–199)
HDL: 45 mg/dL (ref >39)
LDL Chol Calc (NIH): 84 mg/dL (ref 0–99)
Triglycerides: 120 mg/dL (ref 0–149)
VLDL Cholesterol Cal: 22 mg/dL (ref 5–40)

## 2020-06-29 ENCOUNTER — Encounter: Payer: Self-pay | Admitting: Family Medicine

## 2020-08-30 ENCOUNTER — Other Ambulatory Visit: Payer: Self-pay

## 2020-08-30 ENCOUNTER — Encounter: Payer: Self-pay | Admitting: Family Medicine

## 2020-08-30 ENCOUNTER — Ambulatory Visit (INDEPENDENT_AMBULATORY_CARE_PROVIDER_SITE_OTHER): Payer: Medicare (Managed Care) | Admitting: Family Medicine

## 2020-08-30 VITALS — BP 133/77 | HR 71 | Temp 98.0°F | Ht 70.25 in | Wt 212.0 lb

## 2020-08-30 DIAGNOSIS — Z23 Encounter for immunization: Secondary | ICD-10-CM | POA: Diagnosis not present

## 2020-08-30 DIAGNOSIS — Z0000 Encounter for general adult medical examination without abnormal findings: Secondary | ICD-10-CM

## 2020-08-30 DIAGNOSIS — E1159 Type 2 diabetes mellitus with other circulatory complications: Secondary | ICD-10-CM

## 2020-08-30 DIAGNOSIS — R972 Elevated prostate specific antigen [PSA]: Secondary | ICD-10-CM

## 2020-08-30 DIAGNOSIS — E118 Type 2 diabetes mellitus with unspecified complications: Secondary | ICD-10-CM | POA: Diagnosis not present

## 2020-08-30 DIAGNOSIS — E782 Mixed hyperlipidemia: Secondary | ICD-10-CM

## 2020-08-30 DIAGNOSIS — E1169 Type 2 diabetes mellitus with other specified complication: Secondary | ICD-10-CM

## 2020-08-30 DIAGNOSIS — E785 Hyperlipidemia, unspecified: Secondary | ICD-10-CM

## 2020-08-30 DIAGNOSIS — I10 Essential (primary) hypertension: Secondary | ICD-10-CM

## 2020-08-30 DIAGNOSIS — N400 Enlarged prostate without lower urinary tract symptoms: Secondary | ICD-10-CM | POA: Diagnosis not present

## 2020-08-30 DIAGNOSIS — I152 Hypertension secondary to endocrine disorders: Secondary | ICD-10-CM

## 2020-08-30 LAB — URINALYSIS, ROUTINE W REFLEX MICROSCOPIC
Bilirubin, UA: NEGATIVE
Glucose, UA: NEGATIVE
Ketones, UA: NEGATIVE
Leukocytes,UA: NEGATIVE
Nitrite, UA: NEGATIVE
Protein,UA: NEGATIVE
RBC, UA: NEGATIVE
Specific Gravity, UA: 1.02 (ref 1.005–1.030)
Urobilinogen, Ur: 0.2 mg/dL (ref 0.2–1.0)
pH, UA: 7 (ref 5.0–7.5)

## 2020-08-30 LAB — MICROALBUMIN, URINE WAIVED
Creatinine, Urine Waived: 200 mg/dL (ref 10–300)
Microalb, Ur Waived: 10 mg/L (ref 0–19)
Microalb/Creat Ratio: <30 mg/g (ref <30)

## 2020-08-30 LAB — BAYER DCA HB A1C WAIVED: HB A1C (BAYER DCA - WAIVED): 6.6 % (ref <7.0)

## 2020-08-30 MED ORDER — LOSARTAN POTASSIUM 100 MG PO TABS
100.0000 mg | ORAL_TABLET | Freq: Every day | ORAL | 1 refills | Status: DC
Start: 2020-08-30 — End: 2021-05-23

## 2020-08-30 MED ORDER — HYDROCHLOROTHIAZIDE 25 MG PO TABS
25.0000 mg | ORAL_TABLET | Freq: Every day | ORAL | 1 refills | Status: DC
Start: 2020-08-30 — End: 2021-05-23

## 2020-08-30 MED ORDER — ATORVASTATIN CALCIUM 40 MG PO TABS: 20.0000 mg | ORAL_TABLET | Freq: Every day | ORAL | 1 refills | Status: DC

## 2020-08-30 MED ORDER — AMLODIPINE BESYLATE 10 MG PO TABS: 10.0000 mg | ORAL_TABLET | Freq: Every day | ORAL | 1 refills | Status: DC
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
## 2020-08-30 NOTE — Assessment & Plan Note (Signed)
Rechecking labs today. Await results.  

## 2020-08-30 NOTE — Assessment & Plan Note (Signed)
Under good control on current regimen. Continue current regimen. Continue to monitor. Call with any concerns. Refills given. Labs drawn today.   

## 2020-08-30 NOTE — Assessment & Plan Note (Signed)
Doing well with A1c of 6.6- will continue to monitor. Call with any concerns.

## 2020-08-30 NOTE — Patient Instructions (Addendum)
Pneumococcal Polysaccharide Vaccine (PPSV23): What You Need to Know 1. Why get vaccinated? Pneumococcal polysaccharide vaccine (PPSV23) can prevent pneumococcal disease. Pneumococcal disease refers to any illness caused by pneumococcal bacteria. These bacteria can cause many types of illnesses, including pneumonia, which is an infection of the lungs. Pneumococcal bacteria are one of the most common causes of pneumonia. Besides pneumonia, pneumococcal bacteria can also cause:  Ear infections  Sinus infections  Meningitis (infection of the tissue covering the brain and spinal cord)  Bacteremia (bloodstream infection) Anyone can get pneumococcal disease, but children under 2 years of age, people with certain medical conditions, adults 65 years or older, and cigarette smokers are at the highest risk. Most pneumococcal infections are mild. However, some can result in long-term problems, such as brain damage or hearing loss. Meningitis, bacteremia, and pneumonia caused by pneumococcal disease can be fatal. 2. PPSV23 PPSV23 protects against 23 types of bacteria that cause pneumococcal disease. PPSV23 is recommended for:  All adults 65 years or older,  Anyone 2 years or older with certain medical conditions that can lead to an increased risk for pneumococcal disease. Most people need only one dose of PPSV23. A second dose of PPSV23, and another type of pneumococcal vaccine called PCV13, are recommended for certain high-risk groups. Your health care provider can give you more information. People 65 years or older should get a dose of PPSV23 even if they have already gotten one or more doses of the vaccine before they turned 65. 3. Talk with your health care provider Tell your vaccine provider if the person getting the vaccine:  Has had an allergic reaction after a previous dose of PPSV23, or has any severe, life-threatening allergies. In some cases, your health care provider may decide to postpone  PPSV23 vaccination to a future visit. People with minor illnesses, such as a cold, may be vaccinated. People who are moderately or severely ill should usually wait until they recover before getting PPSV23. Your health care provider can give you more information. 4. Risks of a vaccine reaction  Redness or pain where the shot is given, feeling tired, fever, or muscle aches can happen after PPSV23. People sometimes faint after medical procedures, including vaccination. Tell your provider if you feel dizzy or have vision changes or ringing in the ears. As with any medicine, there is a very remote chance of a vaccine causing a severe allergic reaction, other serious injury, or death. 5. What if there is a serious problem? An allergic reaction could occur after the vaccinated person leaves the clinic. If you see signs of a severe allergic reaction (hives, swelling of the face and throat, difficulty breathing, a fast heartbeat, dizziness, or weakness), call 9-1-1 and get the person to the nearest hospital. For other signs that concern you, call your health care provider. Adverse reactions should be reported to the Vaccine Adverse Event Reporting System (VAERS). Your health care provider will usually file this report, or you can do it yourself. Visit the VAERS website at www.vaers.hhs.gov or call 1-800-822-7967. VAERS is only for reporting reactions, and VAERS staff do not give medical advice. 6. How can I learn more?  Ask your health care provider.  Call your local or state health department.  Contact the Centers for Disease Control and Prevention (CDC): ? Call 1-800-232-4636 (1-800-CDC-INFO) or ? Visit CDC's website at www.cdc.gov/vaccines Vaccine Information Statement PPSV23 Vaccine (03/26/2018) This information is not intended to replace advice given to you by your health care provider. Make sure you discuss   any questions you have with your health care provider. Document Revised: 01/15/2020  Document Reviewed: 01/15/2020 Elsevier Patient Education  2021 Elsevier Inc.   

## 2020-08-31 LAB — COMPREHENSIVE METABOLIC PANEL
ALT: 31 IU/L (ref 0–44)
AST: 32 IU/L (ref 0–40)
Albumin/Globulin Ratio: 1.5 (ref 1.2–2.2)
Albumin: 4.5 g/dL (ref 3.8–4.8)
Alkaline Phosphatase: 62 IU/L (ref 44–121)
BUN/Creatinine Ratio: 17 (ref 10–24)
BUN: 19 mg/dL (ref 8–27)
Bilirubin Total: 0.5 mg/dL (ref 0.0–1.2)
CO2: 21 mmol/L (ref 20–29)
Calcium: 9.8 mg/dL (ref 8.6–10.2)
Chloride: 99 mmol/L (ref 96–106)
Creatinine, Ser: 1.13 mg/dL (ref 0.76–1.27)
Globulin, Total: 3 g/dL (ref 1.5–4.5)
Glucose: 93 mg/dL (ref 65–99)
Potassium: 4.4 mmol/L (ref 3.5–5.2)
Sodium: 138 mmol/L (ref 134–144)
Total Protein: 7.5 g/dL (ref 6.0–8.5)
eGFR: 71 mL/min/{1.73_m2} (ref >59)

## 2020-08-31 LAB — CBC WITH DIFFERENTIAL/PLATELET
Basophils Absolute: 0 10*3/uL (ref 0.0–0.2)
Basos: 1 %
EOS (ABSOLUTE): 0.2 10*3/uL (ref 0.0–0.4)
Eos: 3 %
Hematocrit: 52.2 % — ABNORMAL HIGH (ref 37.5–51.0)
Hemoglobin: 17 g/dL (ref 13.0–17.7)
Immature Grans (Abs): 0 10*3/uL (ref 0.0–0.1)
Immature Granulocytes: 0 %
Lymphocytes Absolute: 1.6 10*3/uL (ref 0.7–3.1)
Lymphs: 34 %
MCH: 26.2 pg — ABNORMAL LOW (ref 26.6–33.0)
MCHC: 32.6 g/dL (ref 31.5–35.7)
MCV: 81 fL (ref 79–97)
Monocytes Absolute: 0.5 10*3/uL (ref 0.1–0.9)
Monocytes: 10 %
Neutrophils Absolute: 2.4 10*3/uL (ref 1.4–7.0)
Neutrophils: 52 %
Platelets: 211 10*3/uL (ref 150–450)
RBC: 6.48 x10E6/uL — ABNORMAL HIGH (ref 4.14–5.80)
RDW: 16.4 % — ABNORMAL HIGH (ref 11.6–15.4)
WBC: 4.7 10*3/uL (ref 3.4–10.8)

## 2020-08-31 LAB — LIPID PANEL W/O CHOL/HDL RATIO
Cholesterol, Total: 165 mg/dL (ref 100–199)
HDL: 48 mg/dL (ref >39)
LDL Chol Calc (NIH): 97 mg/dL (ref 0–99)
Triglycerides: 108 mg/dL (ref 0–149)
VLDL Cholesterol Cal: 20 mg/dL (ref 5–40)

## 2020-08-31 LAB — TSH: TSH: 3.43 u[IU]/mL (ref 0.450–4.500)

## 2020-08-31 LAB — PSA: Prostate Specific Ag, Serum: 7 ng/mL — ABNORMAL HIGH (ref 0.0–4.0)

## 2020-09-08 ENCOUNTER — Other Ambulatory Visit: Payer: Self-pay | Admitting: Family Medicine

## 2020-09-08 DIAGNOSIS — R972 Elevated prostate specific antigen [PSA]: Secondary | ICD-10-CM

## 2020-09-30 ENCOUNTER — Ambulatory Visit (INDEPENDENT_AMBULATORY_CARE_PROVIDER_SITE_OTHER): Payer: Medicare (Managed Care)

## 2020-09-30 VITALS — Ht 70.0 in | Wt 213.0 lb

## 2020-09-30 DIAGNOSIS — Z0000 Encounter for general adult medical examination without abnormal findings: Secondary | ICD-10-CM | POA: Diagnosis not present
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
## 2020-09-30 NOTE — Patient Instructions (Signed)
Donald Le , Thank you for taking time to come for your Medicare Wellness Visit. I appreciate your ongoing commitment to your health goals. Please review the following plan we discussed and let me know if I can assist you in the future.   Screening recommendations/referrals: Colonoscopy: completed 08/31/2019 Recommended yearly ophthalmology/optometry visit for glaucoma screening and checkup Recommended yearly dental visit for hygiene and checkup  Vaccinations: Influenza vaccine: due 12/26/2020 Pneumococcal vaccine: completed 08/30/2020 Tdap vaccine: completed 05/10/2014, due 05/10/2024 Shingles vaccine: discussed   Covid-19:  04/18/2020, 07/30/2019, 07/06/2019  Advanced directives: Please bring a copy of your POA (Power of Attorney) and/or Living Will to your next appointment.   Conditions/risks identified: none  Next appointment: Follow up in one year for your annual wellness visit.   Preventive Care 40 Years and Older, Male Preventive care refers to lifestyle choices and visits with your health care provider that can promote health and wellness. What does preventive care include?  A yearly physical exam. This is also called an annual well check.  Dental exams once or twice a year.  Routine eye exams. Ask your health care provider how often you should have your eyes checked.  Personal lifestyle choices, including:  Daily care of your teeth and gums.  Regular physical activity.  Eating a healthy diet.  Avoiding tobacco and drug use.  Limiting alcohol use.  Practicing safe sex.  Taking low doses of aspirin every day.  Taking vitamin and mineral supplements as recommended by your health care provider. What happens during an annual well check? The services and screenings done by your health care provider during your annual well check will depend on your age, overall health, lifestyle risk factors, and family history of disease. Counseling  Your health care provider may ask  you questions about your:  Alcohol use.  Tobacco use.  Drug use.  Emotional well-being.  Home and relationship well-being.  Sexual activity.  Eating habits.  History of falls.  Memory and ability to understand (cognition).  Work and work Statistician. Screening  You may have the following tests or measurements:  Height, weight, and BMI.  Blood pressure.  Lipid and cholesterol levels. These may be checked every 5 years, or more frequently if you are over 93 years old.  Skin check.  Lung cancer screening. You may have this screening every year starting at age 66 if you have a 30-pack-year history of smoking and currently smoke or have quit within the past 15 years.  Fecal occult blood test (FOBT) of the stool. You may have this test every year starting at age 66.  Flexible sigmoidoscopy or colonoscopy. You may have a sigmoidoscopy every 5 years or a colonoscopy every 10 years starting at age 70.  Prostate cancer screening. Recommendations will vary depending on your family history and other risks.  Hepatitis C blood test.  Hepatitis B blood test.  Sexually transmitted disease (STD) testing.  Diabetes screening. This is done by checking your blood sugar (glucose) after you have not eaten for a while (fasting). You may have this done every 1-3 years.  Abdominal aortic aneurysm (AAA) screening. You may need this if you are a current or former smoker.  Osteoporosis. You may be screened starting at age 8 if you are at high risk. Talk with your health care provider about your test results, treatment options, and if necessary, the need for more tests. Vaccines  Your health care provider may recommend certain vaccines, such as:  Influenza vaccine. This is recommended  every year.  Tetanus, diphtheria, and acellular pertussis (Tdap, Td) vaccine. You may need a Td booster every 10 years.  Zoster vaccine. You may need this after age 39.  Pneumococcal 13-valent conjugate  (PCV13) vaccine. One dose is recommended after age 16.  Pneumococcal polysaccharide (PPSV23) vaccine. One dose is recommended after age 70. Talk to your health care provider about which screenings and vaccines you need and how often you need them. This information is not intended to replace advice given to you by your health care provider. Make sure you discuss any questions you have with your health care provider. Document Released: 06/10/2015 Document Revised: 02/01/2016 Document Reviewed: 03/15/2015 Elsevier Interactive Patient Education  2017 [place] Prevention in the Home Falls can cause injuries. They can happen to people of all ages. There are many things you can do to make your home safe and to help prevent falls. What can I do on the outside of my home?  Regularly fix the edges of walkways and driveways and fix any cracks.  Remove anything that might make you trip as you walk through a door, such as a raised step or threshold.  Trim any bushes or trees on the path to your home.  Use bright outdoor lighting.  Clear any walking paths of anything that might make someone trip, such as rocks or tools.  Regularly check to see if handrails are loose or broken. Make sure that both sides of any steps have handrails.  Any raised decks and porches should have guardrails on the edges.  Have any leaves, snow, or ice cleared regularly.  Use sand or salt on walking paths during winter.  Clean up any spills in your garage right away. This includes oil or grease spills. What can I do in the bathroom?  Use night lights.  Install grab bars by the toilet and in the tub and shower. Do not use towel bars as grab bars.  Use non-skid mats or decals in the tub or shower.  If you need to sit down in the shower, use a plastic, non-slip stool.  Keep the floor dry. Clean up any water that spills on the floor as soon as it happens.  Remove soap buildup in the tub or shower  regularly.  Attach bath mats securely with double-sided non-slip rug tape.  Do not have throw rugs and other things on the floor that can make you trip. What can I do in the bedroom?  Use night lights.  Make sure that you have a light by your bed that is easy to reach.  Do not use any sheets or blankets that are too big for your bed. They should not hang down onto the floor.  Have a firm chair that has side arms. You can use this for support while you get dressed.  Do not have throw rugs and other things on the floor that can make you trip. What can I do in the kitchen?  Clean up any spills right away.  Avoid walking on wet floors.  Keep items that you use a lot in easy-to-reach places.  If you need to reach something above you, use a strong step stool that has a grab bar.  Keep electrical cords out of the way.  Do not use floor polish or wax that makes floors slippery. If you must use wax, use non-skid floor wax.  Do not have throw rugs and other things on the floor that can make you trip. What  can I do with my stairs?  Do not leave any items on the stairs.  Make sure that there are handrails on both sides of the stairs and use them. Fix handrails that are broken or loose. Make sure that handrails are as long as the stairways.  Check any carpeting to make sure that it is firmly attached to the stairs. Fix any carpet that is loose or worn.  Avoid having throw rugs at the top or bottom of the stairs. If you do have throw rugs, attach them to the floor with carpet tape.  Make sure that you have a light switch at the top of the stairs and the bottom of the stairs. If you do not have them, ask someone to add them for you. What else can I do to help prevent falls?  Wear shoes that:  Do not have high heels.  Have rubber bottoms.  Are comfortable and fit you well.  Are closed at the toe. Do not wear sandals.  If you use a stepladder:  Make sure that it is fully  opened. Do not climb a closed stepladder.  Make sure that both sides of the stepladder are locked into place.  Ask someone to hold it for you, if possible.  Clearly mark and make sure that you can see:  Any grab bars or handrails.  First and last steps.  Where the edge of each step is.  Use tools that help you move around (mobility aids) if they are needed. These include:  Canes.  Walkers.  Scooters.  Crutches.  Turn on the lights when you go into a dark area. Replace any light bulbs as soon as they burn out.  Set up your furniture so you have a clear path. Avoid moving your furniture around.  If any of your floors are uneven, fix them.  If there are any pets around you, be aware of where they are.  Review your medicines with your doctor. Some medicines can make you feel dizzy. This can increase your chance of falling. Ask your doctor what other things that you can do to help prevent falls. This information is not intended to replace advice given to you by your health care provider. Make sure you discuss any questions you have with your health care provider. Document Released: 03/10/2009 Document Revised: 10/20/2015 Document Reviewed: 06/18/2014 Elsevier Interactive Patient Education  2017 [employer].

## 2020-10-04 ENCOUNTER — Encounter: Payer: Self-pay | Admitting: Urology

## 2020-10-04 ENCOUNTER — Other Ambulatory Visit: Payer: Self-pay

## 2020-10-04 ENCOUNTER — Ambulatory Visit (INDEPENDENT_AMBULATORY_CARE_PROVIDER_SITE_OTHER): Payer: Medicare (Managed Care) | Admitting: Urology

## 2020-10-04 VITALS — BP 160/82 | HR 90

## 2020-10-04 DIAGNOSIS — N400 Enlarged prostate without lower urinary tract symptoms: Secondary | ICD-10-CM

## 2020-10-04 DIAGNOSIS — R972 Elevated prostate specific antigen [PSA]: Secondary | ICD-10-CM | POA: Diagnosis not present
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
## 2020-10-05 ENCOUNTER — Telehealth: Payer: Self-pay | Admitting: *Deleted

## 2020-10-05 LAB — PSA: Prostate Specific Ag, Serum: 6 ng/mL — ABNORMAL HIGH (ref 0.0–4.0)

## 2020-10-05 NOTE — Telephone Encounter (Addendum)
Patient informed, voiced understanding. Appointment scheduled.  ----- Message from [name], MD sent at 10/05/2020  7:58 AM EDT ----- PSA remains completely stable.  Lets have him follow-up in a year with a PSA with rectal exam.  [name], MD

## 2020-10-06 ENCOUNTER — Other Ambulatory Visit: Payer: Self-pay

## 2021-04-11 ENCOUNTER — Ambulatory Visit: Payer: Self-pay | Admitting: Urology

## 2021-05-23 ENCOUNTER — Other Ambulatory Visit: Payer: Self-pay | Admitting: Internal Medicine

## 2021-05-23 DIAGNOSIS — I10 Essential (primary) hypertension: Secondary | ICD-10-CM

## 2021-05-23 DIAGNOSIS — E782 Mixed hyperlipidemia: Secondary | ICD-10-CM

## 2021-05-23 NOTE — Telephone Encounter (Signed)
Medication Refill - Medication: amLODipine (NORVASC) 10 MG tablet, hydrochlorothiazide (HYDRODIURIL) 25 MG tablet,  atorvastatin (LIPITOR) 40 MG tablet, losartan (COZAAR) 100 MG tablet   Has the patient contacted their pharmacy? Yes  (Agent: If yes, when and what did the pharmacy advise?) Patient is using a new pharmacy and was advised patient medication is not on file therefore contact PCP office and have the practice send medication on file  Preferred Pharmacy (with phone number or street name):   [facility], [address] [place], [place]   Has the patient been seen for an appointment in the last year OR does the patient have an upcoming appointment? Yes.    Agent: Please be advised that RX refills may take up to 3 business days. We ask that you follow-up with your pharmacy.

## 2021-05-24 ENCOUNTER — Other Ambulatory Visit: Payer: Self-pay | Admitting: Family Medicine

## 2021-05-24 MED ORDER — HYDROCHLOROTHIAZIDE 25 MG PO TABS
25.0000 mg | ORAL_TABLET | Freq: Every day | ORAL | 0 refills | Status: DC
Start: 2021-05-24 — End: 2021-05-24

## 2021-05-24 MED ORDER — ATORVASTATIN CALCIUM 40 MG PO TABS: 20.0000 mg | ORAL_TABLET | Freq: Every day | ORAL | 0 refills | Status: DC

## 2021-05-24 MED ORDER — LOSARTAN POTASSIUM 100 MG PO TABS
100.0000 mg | ORAL_TABLET | Freq: Every day | ORAL | 0 refills | Status: DC
Start: 2021-05-24 — End: 2021-05-24

## 2021-05-24 MED ORDER — AMLODIPINE BESYLATE 10 MG PO TABS: 10.0000 mg | ORAL_TABLET | Freq: Every day | ORAL | 0 refills | Status: DC
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
## 2021-07-25 ENCOUNTER — Encounter: Payer: Self-pay | Admitting: Internal Medicine

## 2021-08-22 ENCOUNTER — Encounter: Payer: Self-pay | Admitting: Internal Medicine

## 2021-09-01 ENCOUNTER — Encounter: Payer: Medicare (Managed Care) | Admitting: Internal Medicine

## 2021-09-04 ENCOUNTER — Encounter: Payer: Medicare (Managed Care) | Admitting: Internal Medicine

## 2021-09-12 ENCOUNTER — Other Ambulatory Visit: Payer: Self-pay | Admitting: Internal Medicine

## 2021-09-12 ENCOUNTER — Ambulatory Visit (INDEPENDENT_AMBULATORY_CARE_PROVIDER_SITE_OTHER): Payer: Medicare PPO | Admitting: Internal Medicine

## 2021-09-12 ENCOUNTER — Other Ambulatory Visit: Payer: Self-pay | Admitting: Family Medicine

## 2021-09-12 ENCOUNTER — Encounter: Payer: Self-pay | Admitting: Internal Medicine

## 2021-09-12 VITALS — BP 120/70 | HR 77 | Temp 98.0°F | Ht 70.24 in | Wt 214.2 lb

## 2021-09-12 DIAGNOSIS — E785 Hyperlipidemia, unspecified: Secondary | ICD-10-CM | POA: Diagnosis not present

## 2021-09-12 DIAGNOSIS — E119 Type 2 diabetes mellitus without complications: Secondary | ICD-10-CM | POA: Diagnosis not present

## 2021-09-12 DIAGNOSIS — I10 Essential (primary) hypertension: Secondary | ICD-10-CM

## 2021-09-12 DIAGNOSIS — E782 Mixed hyperlipidemia: Secondary | ICD-10-CM

## 2021-09-12 LAB — MICROALBUMIN, URINE WAIVED
Creatinine, Urine Waived: 50 mg/dL (ref 10–300)
Microalb, Ur Waived: 10 mg/L (ref 0–19)

## 2021-09-12 LAB — URINALYSIS, ROUTINE W REFLEX MICROSCOPIC
Bilirubin, UA: NEGATIVE
Ketones, UA: NEGATIVE
Leukocytes,UA: NEGATIVE
Nitrite, UA: NEGATIVE
Protein,UA: NEGATIVE
RBC, UA: NEGATIVE
Specific Gravity, UA: 1.02 (ref 1.005–1.030)
Urobilinogen, Ur: 1 mg/dL (ref 0.2–1.0)
pH, UA: 7 (ref 5.0–7.5)

## 2021-09-12 LAB — BAYER DCA HB A1C WAIVED: HB A1C (BAYER DCA - WAIVED): 6.5 % — ABNORMAL HIGH (ref 4.8–5.6)

## 2021-09-12 NOTE — Telephone Encounter (Signed)
Requested medications are due for refill today.  yes ? ?Requested medications are on the active medications list.  yes ? ?Last refill. 05/24/2021 #45 0 refills ? ?Future visit scheduled.   yes ? ?Notes to clinic.  Refill failed protocol due to expired labs. ? ? ? ?Requested Prescriptions  ?Pending Prescriptions Disp Refills  ? atorvastatin (LIPITOR) 40 MG tablet [Pharmacy Med Name: ATORVASTATIN 40 MG TABLET] 45 tablet 0  ?  Sig: Take 0.5 tablets (20 mg total) by mouth daily. 1/2 tablet daily  ?  ? Cardiovascular:  Antilipid - Statins Failed - 09/12/2021  2:25 AM  ?  ?  Failed - Lipid Panel in normal range within the last 12 months  ?  Cholesterol, Total  ?Date Value Ref Range Status  ?08/30/2020 165 100 - 199 mg/dL Final  ? ?Cholesterol [place], [place]  ?Date Value Ref Range Status  ?09/20/2016 206 (H) <200 mg/dL Final  ?  Comment:  ?                          Desirable                <200 ?                        Borderline High      200- 239 ?                        High                     >239 ?  ? ?LDL Chol Calc (NIH)  ?Date Value Ref Range Status  ?08/30/2020 97 0 - 99 mg/dL Final  ? ?HDL  ?Date Value Ref Range Status  ?08/30/2020 48 >39 mg/dL Final  ? ?Triglycerides  ?Date Value Ref Range Status  ?08/30/2020 108 0 - 149 mg/dL Final  ? ?Triglycerides Piccolo,Waived  ?Date Value Ref Range Status  ?09/20/2016 122 <150 mg/dL Final  ?  Comment:  ?                          Normal                   <150 ?                        Borderline High     150 - 199 ?                        High                200 - 499 ?                        Very High                >499 ?  ? ?  ?  ?  Passed - Patient is not pregnant  ?  ?  Passed - Valid encounter within last 12 months  ?  Recent Outpatient Visits   ? ?      ? Today Diabetes mellitus without complication ([place])  ? [facility] Vigg, Avanti, MD  ? 1 year ago Routine general medical examination at a health care facility  ? [place]  [place]  P, DO  ? 1 year ago Hypertension associated with diabetes ([place])  ? [place], Megan P, DO  ? 1 year ago Leg cramp  ? [place], NP  ? 1 year ago Mixed hyperlipidemia  ? [facility] Eulogio Bear, NP  ? ?  ?  ?Future Appointments   ? ?        ? In 2 weeks  [facility], [facility]  ? In 3 weeks [name], MD [place]  ? ?  ? ? ?  ?  ?  ?  ?

## 2021-09-12 NOTE — Telephone Encounter (Signed)
Requested Prescriptions  ?Pending Prescriptions Disp Refills  ?? amLODipine (NORVASC) 10 MG tablet [Pharmacy Med Name: AMLODIPINE BESYLATE 10 MG TAB] 90 tablet 1  ?  Sig: TAKE 1 TABLET BY MOUTH EVERY DAY  ?  ? Cardiovascular: Calcium Channel Blockers 2 Failed - 09/12/2021  2:25 AM  ?  ?  Failed - Last BP in normal range  ?  BP Readings from Last 1 Encounters:  ?09/12/21 120/70  ?   ?  ?  Failed - Valid encounter within last 6 months  ?  Recent Outpatient Visits   ?      ? Today Diabetes mellitus without complication ([place])  ? [facility] Vigg, Avanti, MD  ? 1 year ago Routine general medical examination at a health care facility  ? [place], [place] P, DO  ? 1 year ago Hypertension associated with diabetes ([place])  ? [place], Megan P, DO  ? 1 year ago Leg cramp  ? [place], NP  ? 1 year ago Mixed hyperlipidemia  ? [facility] Eulogio Bear, NP  ?  ?  ?Future Appointments   ?        ? In 2 weeks  [facility], [facility]  ? In 3 weeks [name], MD [place]  ?  ? ?  ?  ?  Passed - Last Heart Rate in normal range  ?  Pulse Readings from Last 1 Encounters:  ?09/12/21 77  ?   ?  ?  ? ?

## 2021-09-12 NOTE — Progress Notes (Signed)
? ?BP 120/70   Pulse 77   Temp 98 ?F (36.7 ?C) (Oral)   Ht 5' 10.24" (1.784 m)   Wt 214 lb 3.2 oz (97.2 kg)   SpO2 98%   BMI 30.53 kg/m?   ? ?Subjective:  ? ? Patient ID: Donald Le, male    DOB: 1950-12-24, 71 y.o.   MRN: [id number] ? ?Chief Complaint  ?Patient presents with  ?? Annual Exam  ? ? ?HPI: ?Donald Le is a 71 y.o. male ? ?Hypertension ?This is a chronic (is on losartan and hctz for such) problem. The current episode started more than 1 year ago. The problem is controlled. Pertinent negatives include no anxiety, blurred vision, chest pain, headaches, malaise/fatigue, neck pain, orthopnea, palpitations, peripheral edema or shortness of breath.  ?Hyperlipidemia ?This is a chronic problem. The problem is controlled. Pertinent negatives include no chest pain or shortness of breath.  ? ?Chief Complaint  ?Patient presents with  ?? Annual Exam  ? ? ?Relevant past medical, surgical, family and social history reviewed and updated as indicated. Interim medical history since our last visit reviewed. ?Allergies and medications reviewed and updated. ? ?Review of Systems  ?Constitutional:  Negative for malaise/fatigue.  ?Eyes:  Negative for blurred vision.  ?Respiratory:  Negative for shortness of breath.   ?Cardiovascular:  Negative for chest pain, palpitations and orthopnea.  ?Musculoskeletal:  Negative for neck pain.  ?Neurological:  Negative for headaches.  ? ?Per HPI unless specifically indicated above ? ?   ?Objective:  ?  ?BP 120/70   Pulse 77   Temp 98 ?F (36.7 ?C) (Oral)   Ht 5' 10.24" (1.784 m)   Wt 214 lb 3.2 oz (97.2 kg)   SpO2 98%   BMI 30.53 kg/m?   ?Wt Readings from Last 3 Encounters:  ?09/12/21 214 lb 3.2 oz (97.2 kg)  ?09/30/20 213 lb (96.6 kg)  ?08/30/20 212 lb (96.2 kg)  ?  ?Physical Exam ?Vitals and nursing note reviewed.  ?Constitutional:   ?   General: He is not in acute distress. ?   Appearance: Normal appearance. He is not ill-appearing or diaphoretic.  ?HENT:  ?    Head: Normocephalic and atraumatic.  ?   Right Ear: Tympanic membrane and external ear normal. There is no impacted cerumen.  ?   Left Ear: External ear normal.  ?   Nose: No congestion or rhinorrhea.  ?   Mouth/Throat:  ?   Pharynx: No oropharyngeal exudate or posterior oropharyngeal erythema.  ?Eyes:  ?   Conjunctiva/sclera: Conjunctivae normal.  ?   Pupils: Pupils are equal, round, and reactive to light.  ?Cardiovascular:  ?   Rate and Rhythm: Normal rate and regular rhythm.  ?   Heart sounds: No murmur heard. ?  No friction rub. No gallop.  ?Pulmonary:  ?   Effort: No respiratory distress.  ?   Breath sounds: No stridor. No wheezing or rhonchi.  ?Chest:  ?   Chest wall: No tenderness.  ?Abdominal:  ?   General: Abdomen is flat. Bowel sounds are normal.  ?   Palpations: Abdomen is soft. There is no mass.  ?   Tenderness: There is no abdominal tenderness.  ?Musculoskeletal:  ?   Cervical back: Normal range of motion and neck supple. No rigidity or tenderness.  ?   Left lower leg: No edema.  ?Skin: ?   General: Skin is warm and dry.  ?Neurological:  ?   Mental Status: He is alert.  ? ?  Results for orders placed or performed in visit on 09/12/21  ?Bayer DCA Hb A1c Waived  ?Result Value Ref Range  ? HB A1C (BAYER DCA - WAIVED) 6.5 (H) 4.8 - 5.6 %  ?Urinalysis, Routine w reflex microscopic  ?Result Value Ref Range  ? Specific Gravity, UA 1.020 1.005 - 1.030  ? pH, UA 7.0 5.0 - 7.5  ? Color, UA Yellow Yellow  ? Appearance Ur Clear Clear  ? Leukocytes,UA Negative Negative  ? Protein,UA Negative Negative/Trace  ? Glucose, UA 1+ (A) Negative  ? Ketones, UA Negative Negative  ? RBC, UA Negative Negative  ? Bilirubin, UA Negative Negative  ? Urobilinogen, Ur 1.0 0.2 - 1.0 mg/dL  ? Nitrite, UA Negative Negative  ? ?   ? ? ?Current Outpatient Medications:  ??  amLODipine (NORVASC) 10 MG tablet, Take 1 tablet (10 mg total) by mouth daily., Disp: 30 tablet, Rfl: 0 ??  aspirin EC 81 MG tablet, Take 81 mg by mouth daily., Disp: ,  Rfl:  ??  atorvastatin (LIPITOR) 40 MG tablet, Take 0.5 tablets (20 mg total) by mouth daily. 1/2 tablet daily, Disp: 45 tablet, Rfl: 0 ??  hydrochlorothiazide (HYDRODIURIL) 25 MG tablet, TAKE 1 TABLET (25 MG TOTAL) BY MOUTH DAILY., Disp: 90 tablet, Rfl: 1 ??  losartan (COZAAR) 100 MG tablet, TAKE 1 TABLET BY MOUTH EVERY DAY, Disp: 90 tablet, Rfl: 1 ??  Misc Natural Products (PROSTATE HEALTH PO), Take by mouth., Disp: , Rfl:  ??  tadalafil (CIALIS) 10 MG tablet, Take 1 tablet (10 mg total) by mouth daily as needed for erectile dysfunction., Disp: 10 tablet, Rfl: 12  ? ? ?Assessment & Plan:  ?DM Pt declines rx at this point ?Lifestyle modifications advised to pt. A1c at  ? Portion control and avoiding high carb low fat diet advised.  Diet plan given to pt   exercise plan given and encouraged.  To increase exercise to 150 mins a week ie 21/2 hours a week. Pt verbalises understanding of the above.  ?recheck HbA1c,diabetic diet plan given to pt  adviced regarding hypoglycemia and instructions given to pt today on how to prevent and treat the same if it were to occur. pt acknowledges the plan and voices understanding of the same.  exercise plan given and encouraged.   advice diabetic yearly podiatry, ophthalmology , nutritionist , dental check q 6 months ? ?HTN is on norvasc and losartan for such  ?HTN :  ?Continue current meds.  Medication compliance emphasised. pt advised to keep Bp logs. Pt verbalised understanding of the same. Pt to have a low salt diet . Exercise to reach a goal of at least 150 mins a week.  lifestyle modifications explained and pt understands importance of the above. ?Under good control on current regimen. Continue current regimen. Continue to monitor. Call with any concerns. Refills given. Labs drawn today. ? ?3. HLD is on lipitor for such  ? ?recheck FLP, check LFT's work on diet, SE of meds explained to pt. low fat and high fiber diet explained to pt. ? ? ?Problem List Items Addressed This Visit    ? ?  ? Endocrine  ? Diabetes mellitus without complication ([place]) - Primary  ? Relevant Orders  ? Bayer DCA Hb A1c Waived (Completed)  ? Lipid panel  ? Basic metabolic panel  ? Vitamin B12  ? Urinalysis, Routine w reflex microscopic (Completed)  ? POCT UA - Microalbumin  ?  ? Other  ? Hyperlipidemia  ?  ? ?  Orders Placed This Encounter  ?Procedures  ?? Bayer DCA Hb A1c Waived  ?? Lipid panel  ?? Basic metabolic panel  ?? Vitamin B12  ?? Urinalysis, Routine w reflex microscopic  ?? POCT UA - Microalbumin  ?  ? ?No orders of the defined types were placed in this encounter. ?  ? ?Follow up plan: ?Return in about 3 months (around 12/12/2021). ? ? ?

## 2021-09-14 LAB — LIPID PANEL
Chol/HDL Ratio: 3.2 ratio (ref 0.0–5.0)
Cholesterol, Total: 145 mg/dL (ref 100–199)
HDL: 45 mg/dL (ref >39)
LDL Chol Calc (NIH): 83 mg/dL (ref 0–99)
Triglycerides: 92 mg/dL (ref 0–149)
VLDL Cholesterol Cal: 17 mg/dL (ref 5–40)

## 2021-09-14 LAB — VITAMIN B12: Vitamin B-12: 619 pg/mL (ref 232–1245)

## 2021-09-14 LAB — BASIC METABOLIC PANEL
BUN/Creatinine Ratio: 8 — ABNORMAL LOW (ref 10–24)
BUN: 10 mg/dL (ref 8–27)
CO2: 22 mmol/L (ref 20–29)
Calcium: 9.8 mg/dL (ref 8.6–10.2)
Chloride: 99 mmol/L (ref 96–106)
Creatinine, Ser: 1.26 mg/dL (ref 0.76–1.27)
Glucose: 199 mg/dL — ABNORMAL HIGH (ref 70–99)
Potassium: 3.8 mmol/L (ref 3.5–5.2)
Sodium: 138 mmol/L (ref 134–144)
eGFR: 62 mL/min/{1.73_m2} (ref >59)

## 2021-09-27 ENCOUNTER — Telehealth: Payer: Self-pay | Admitting: Internal Medicine

## 2021-09-27 NOTE — Telephone Encounter (Signed)
Returned call to patient, LM for him to call back if he needed to reschedule his AWV scheduled for 10/02/21. ?

## 2021-09-27 NOTE — Telephone Encounter (Signed)
Copied from [place] [phone]. Topic: Appointment Scheduling - Scheduling Inquiry for Clinic ?>> Sep 27, 2021 12:58 PM Valere Dross wrote: ?Reason for CRM: Pt called in to reschedule his AWV appt on 05/08, and requested a call back, please advise. ?

## 2021-09-29 ENCOUNTER — Other Ambulatory Visit: Payer: Self-pay | Admitting: *Deleted

## 2021-09-29 DIAGNOSIS — R972 Elevated prostate specific antigen [PSA]: Secondary | ICD-10-CM

## 2021-10-02 ENCOUNTER — Other Ambulatory Visit: Payer: Medicare PPO

## 2021-10-02 ENCOUNTER — Ambulatory Visit (INDEPENDENT_AMBULATORY_CARE_PROVIDER_SITE_OTHER): Payer: Medicare PPO | Admitting: *Deleted

## 2021-10-02 DIAGNOSIS — Z0000 Encounter for general adult medical examination without abnormal findings: Secondary | ICD-10-CM | POA: Diagnosis not present

## 2021-10-02 DIAGNOSIS — R972 Elevated prostate specific antigen [PSA]: Secondary | ICD-10-CM | POA: Diagnosis not present

## 2021-10-02 NOTE — Patient Instructions (Signed)
Donald Le , ?Thank you for taking time to come for your Medicare Wellness Visit. I appreciate your ongoing commitment to your health goals. Please review the following plan we discussed and let me know if I can assist you in the future.  ? ?Screening recommendations/referrals: ?Colonoscopy: up to date ?Recommended yearly ophthalmology/optometry visit for glaucoma screening and checkup ?Recommended yearly dental visit for hygiene and checkup ? ?Vaccinations: ?Influenza vaccine: Education provided ?Pneumococcal vaccine: up to date ?Tdap vaccine: up to date ?Shingles vaccine: Education provided   ? ?Advanced directives: Education provided ? ?Conditions/risks identified:  ? ?Next appointment: 12-12-2021 3:00  ? ?Preventive Care 54 Years and Older, Male ?Preventive care refers to lifestyle choices and visits with your health care provider that can promote health and wellness. ?What does preventive care include? ?A yearly physical exam. This is also called an annual well check. ?Dental exams once or twice a year. ?Routine eye exams. Ask your health care provider how often you should have your eyes checked. ?Personal lifestyle choices, including: ?Daily care of your teeth and gums. ?Regular physical activity. ?Eating a healthy diet. ?Avoiding tobacco and drug use. ?Limiting alcohol use. ?Practicing safe sex. ?Taking low doses of aspirin every day. ?Taking vitamin and mineral supplements as recommended by your health care provider. ?What happens during an annual well check? ?The services and screenings done by your health care provider during your annual well check will depend on your age, overall health, lifestyle risk factors, and family history of disease. ?Counseling  ?Your health care provider may ask you questions about your: ?Alcohol use. ?Tobacco use. ?Drug use. ?Emotional well-being. ?Home and relationship well-being. ?Sexual activity. ?Eating habits. ?History of falls. ?Memory and ability to understand  (cognition). ?Work and work Statistician. ?Screening  ?You may have the following tests or measurements: ?Height, weight, and BMI. ?Blood pressure. ?Lipid and cholesterol levels. These may be checked every 5 years, or more frequently if you are over 34 years old. ?Skin check. ?Lung cancer screening. You may have this screening every year starting at age 63 if you have a 30-pack-year history of smoking and currently smoke or have quit within the past 15 years. ?Fecal occult blood test (FOBT) of the stool. You may have this test every year starting at age 17. ?Flexible sigmoidoscopy or colonoscopy. You may have a sigmoidoscopy every 5 years or a colonoscopy every 10 years starting at age 74. ?Prostate cancer screening. Recommendations will vary depending on your family history and other risks. ?Hepatitis C blood test. ?Hepatitis B blood test. ?Sexually transmitted disease (STD) testing. ?Diabetes screening. This is done by checking your blood sugar (glucose) after you have not eaten for a while (fasting). You may have this done every 1-3 years. ?Abdominal aortic aneurysm (AAA) screening. You may need this if you are a current or former smoker. ?Osteoporosis. You may be screened starting at age 3 if you are at high risk. ?Talk with your health care provider about your test results, treatment options, and if necessary, the need for more tests. ?Vaccines  ?Your health care provider may recommend certain vaccines, such as: ?Influenza vaccine. This is recommended every year. ?Tetanus, diphtheria, and acellular pertussis (Tdap, Td) vaccine. You may need a Td booster every 10 years. ?Zoster vaccine. You may need this after age 55. ?Pneumococcal 13-valent conjugate (PCV13) vaccine. One dose is recommended after age 36. ?Pneumococcal polysaccharide (PPSV23) vaccine. One dose is recommended after age 31. ?Talk to your health care provider about which screenings and vaccines you need  and how often you need them. ?This  information is not intended to replace advice given to you by your health care provider. Make sure you discuss any questions you have with your health care provider. ?Document Released: 06/10/2015 Document Revised: 02/01/2016 Document Reviewed: 03/15/2015 ?Elsevier Interactive Patient Education ? 2017 [place]. ? ?Fall Prevention in the Home ?Falls can cause injuries. They can happen to people of all ages. There are many things you can do to make your home safe and to help prevent falls. ?What can I do on the outside of my home? ?Regularly fix the edges of walkways and driveways and fix any cracks. ?Remove anything that might make you trip as you walk through a door, such as a raised step or threshold. ?Trim any bushes or trees on the path to your home. ?Use bright outdoor lighting. ?Clear any walking paths of anything that might make someone trip, such as rocks or tools. ?Regularly check to see if handrails are loose or broken. Make sure that both sides of any steps have handrails. ?Any raised decks and porches should have guardrails on the edges. ?Have any leaves, snow, or ice cleared regularly. ?Use sand or salt on walking paths during winter. ?Clean up any spills in your garage right away. This includes oil or grease spills. ?What can I do in the bathroom? ?Use night lights. ?Install grab bars by the toilet and in the tub and shower. Do not use towel bars as grab bars. ?Use non-skid mats or decals in the tub or shower. ?If you need to sit down in the shower, use a plastic, non-slip stool. ?Keep the floor dry. Clean up any water that spills on the floor as soon as it happens. ?Remove soap buildup in the tub or shower regularly. ?Attach bath mats securely with double-sided non-slip rug tape. ?Do not have throw rugs and other things on the floor that can make you trip. ?What can I do in the bedroom? ?Use night lights. ?Make sure that you have a light by your bed that is easy to reach. ?Do not use any sheets or  blankets that are too big for your bed. They should not hang down onto the floor. ?Have a firm chair that has side arms. You can use this for support while you get dressed. ?Do not have throw rugs and other things on the floor that can make you trip. ?What can I do in the kitchen? ?Clean up any spills right away. ?Avoid walking on wet floors. ?Keep items that you use a lot in easy-to-reach places. ?If you need to reach something above you, use a strong step stool that has a grab bar. ?Keep electrical cords out of the way. ?Do not use floor polish or wax that makes floors slippery. If you must use wax, use non-skid floor wax. ?Do not have throw rugs and other things on the floor that can make you trip. ?What can I do with my stairs? ?Do not leave any items on the stairs. ?Make sure that there are handrails on both sides of the stairs and use them. Fix handrails that are broken or loose. Make sure that handrails are as long as the stairways. ?Check any carpeting to make sure that it is firmly attached to the stairs. Fix any carpet that is loose or worn. ?Avoid having throw rugs at the top or bottom of the stairs. If you do have throw rugs, attach them to the floor with carpet tape. ?Make sure that you  have a light switch at the top of the stairs and the bottom of the stairs. If you do not have them, ask someone to add them for you. ?What else can I do to help prevent falls? ?Wear shoes that: ?Do not have high heels. ?Have rubber bottoms. ?Are comfortable and fit you well. ?Are closed at the toe. Do not wear sandals. ?If you use a stepladder: ?Make sure that it is fully opened. Do not climb a closed stepladder. ?Make sure that both sides of the stepladder are locked into place. ?Ask someone to hold it for you, if possible. ?Clearly mark and make sure that you can see: ?Any grab bars or handrails. ?First and last steps. ?Where the edge of each step is. ?Use tools that help you move around (mobility aids) if they are  needed. These include: ?Canes. ?Walkers. ?Scooters. ?Crutches. ?Turn on the lights when you go into a dark area. Replace any light bulbs as soon as they burn out. ?Set up your furniture so you have a clear path. Avoid mo
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
## 2021-10-03 ENCOUNTER — Other Ambulatory Visit: Payer: Medicare PPO

## 2021-10-03 LAB — PSA: Prostate Specific Ag, Serum: 7.2 ng/mL — ABNORMAL HIGH (ref 0.0–4.0)

## 2021-10-04 ENCOUNTER — Encounter: Payer: Self-pay | Admitting: Urology

## 2021-10-04 ENCOUNTER — Ambulatory Visit (INDEPENDENT_AMBULATORY_CARE_PROVIDER_SITE_OTHER): Payer: Medicare PPO | Admitting: Urology

## 2021-10-04 VITALS — BP 142/74 | HR 76 | Ht 70.0 in | Wt 213.0 lb

## 2021-10-04 DIAGNOSIS — N529 Male erectile dysfunction, unspecified: Secondary | ICD-10-CM

## 2021-10-04 DIAGNOSIS — R972 Elevated prostate specific antigen [PSA]: Secondary | ICD-10-CM | POA: Diagnosis not present

## 2021-10-04 MED ORDER — NONFORMULARY OR COMPOUNDED ITEM: 0 refills | Status: DC
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
## 2021-10-04 NOTE — Patient Instructions (Signed)
Prostate MRI Prep: ? ?1- No ejaculation 48 hours prior to exam ? ?2- No food or drink or caffeine 4 hours prior to exam ? ?3- Fleets enema needs to be done 4 hours prior to exam  ? ?4- Urinate just prior to exam  ?

## 2021-10-07 ENCOUNTER — Emergency Department: Payer: Medicare PPO

## 2021-10-07 ENCOUNTER — Inpatient Hospital Stay
Admission: EM | Admit: 2021-10-07 | Discharge: 2021-10-09 | DRG: 871 | Disposition: A | Discharge status: Home / Self Care | Payer: Medicare PPO | Attending: Internal Medicine | Admitting: Internal Medicine

## 2021-10-07 ENCOUNTER — Other Ambulatory Visit: Payer: Self-pay

## 2021-10-07 DIAGNOSIS — N400 Enlarged prostate without lower urinary tract symptoms: Secondary | ICD-10-CM | POA: Diagnosis present

## 2021-10-07 DIAGNOSIS — J9601 Acute respiratory failure with hypoxia: Secondary | ICD-10-CM | POA: Diagnosis not present

## 2021-10-07 DIAGNOSIS — M47814 Spondylosis without myelopathy or radiculopathy, thoracic region: Secondary | ICD-10-CM | POA: Diagnosis not present

## 2021-10-07 DIAGNOSIS — E1159 Type 2 diabetes mellitus with other circulatory complications: Secondary | ICD-10-CM | POA: Diagnosis not present

## 2021-10-07 DIAGNOSIS — J986 Disorders of diaphragm: Secondary | ICD-10-CM | POA: Diagnosis not present

## 2021-10-07 DIAGNOSIS — D3501 Benign neoplasm of right adrenal gland: Secondary | ICD-10-CM

## 2021-10-07 DIAGNOSIS — J189 Pneumonia, unspecified organism: Secondary | ICD-10-CM

## 2021-10-07 DIAGNOSIS — J181 Lobar pneumonia, unspecified organism: Secondary | ICD-10-CM | POA: Diagnosis not present

## 2021-10-07 DIAGNOSIS — J188 Other pneumonia, unspecified organism: Secondary | ICD-10-CM

## 2021-10-07 DIAGNOSIS — N529 Male erectile dysfunction, unspecified: Secondary | ICD-10-CM | POA: Diagnosis present

## 2021-10-07 DIAGNOSIS — A419 Sepsis, unspecified organism: Principal | ICD-10-CM | POA: Diagnosis present

## 2021-10-07 DIAGNOSIS — R569 Unspecified convulsions: Secondary | ICD-10-CM | POA: Diagnosis not present

## 2021-10-07 DIAGNOSIS — E1169 Type 2 diabetes mellitus with other specified complication: Secondary | ICD-10-CM | POA: Diagnosis not present

## 2021-10-07 DIAGNOSIS — Z811 Family history of alcohol abuse and dependence: Secondary | ICD-10-CM

## 2021-10-07 DIAGNOSIS — J168 Pneumonia due to other specified infectious organisms: Secondary | ICD-10-CM | POA: Diagnosis not present

## 2021-10-07 DIAGNOSIS — E119 Type 2 diabetes mellitus without complications: Secondary | ICD-10-CM

## 2021-10-07 DIAGNOSIS — Z8249 Family history of ischemic heart disease and other diseases of the circulatory system: Secondary | ICD-10-CM | POA: Diagnosis not present

## 2021-10-07 DIAGNOSIS — R000 Tachycardia, unspecified: Secondary | ICD-10-CM | POA: Diagnosis present

## 2021-10-07 DIAGNOSIS — Z20822 Contact with and (suspected) exposure to covid-19: Secondary | ICD-10-CM | POA: Diagnosis present

## 2021-10-07 DIAGNOSIS — Z8349 Family history of other endocrine, nutritional and metabolic diseases: Secondary | ICD-10-CM

## 2021-10-07 DIAGNOSIS — Z87891 Personal history of nicotine dependence: Secondary | ICD-10-CM | POA: Diagnosis not present

## 2021-10-07 DIAGNOSIS — E278 Other specified disorders of adrenal gland: Secondary | ICD-10-CM | POA: Diagnosis not present

## 2021-10-07 DIAGNOSIS — R0689 Other abnormalities of breathing: Secondary | ICD-10-CM | POA: Diagnosis not present

## 2021-10-07 DIAGNOSIS — Z7982 Long term (current) use of aspirin: Secondary | ICD-10-CM | POA: Diagnosis not present

## 2021-10-07 DIAGNOSIS — I152 Hypertension secondary to endocrine disorders: Secondary | ICD-10-CM | POA: Diagnosis not present

## 2021-10-07 DIAGNOSIS — E785 Hyperlipidemia, unspecified: Secondary | ICD-10-CM | POA: Diagnosis present

## 2021-10-07 DIAGNOSIS — Z79899 Other long term (current) drug therapy: Secondary | ICD-10-CM

## 2021-10-07 DIAGNOSIS — N179 Acute kidney failure, unspecified: Secondary | ICD-10-CM

## 2021-10-07 DIAGNOSIS — J929 Pleural plaque without asbestos: Secondary | ICD-10-CM | POA: Diagnosis not present

## 2021-10-07 DIAGNOSIS — R0902 Hypoxemia: Secondary | ICD-10-CM | POA: Diagnosis not present

## 2021-10-07 DIAGNOSIS — E872 Acidosis, unspecified: Secondary | ICD-10-CM | POA: Diagnosis present

## 2021-10-07 DIAGNOSIS — R6520 Severe sepsis without septic shock: Secondary | ICD-10-CM

## 2021-10-07 DIAGNOSIS — Z823 Family history of stroke: Secondary | ICD-10-CM | POA: Diagnosis not present

## 2021-10-07 DIAGNOSIS — J984 Other disorders of lung: Secondary | ICD-10-CM | POA: Diagnosis not present

## 2021-10-07 DIAGNOSIS — K30 Functional dyspepsia: Secondary | ICD-10-CM | POA: Diagnosis present

## 2021-10-07 DIAGNOSIS — I959 Hypotension, unspecified: Secondary | ICD-10-CM | POA: Diagnosis not present

## 2021-10-07 LAB — CBC WITH DIFFERENTIAL/PLATELET
Abs Immature Granulocytes: 0.02 10*3/uL (ref 0.00–0.07)
Basophils Absolute: 0 10*3/uL (ref 0.0–0.1)
Basophils Relative: 0 %
Eosinophils Absolute: 0 10*3/uL (ref 0.0–0.5)
Eosinophils Relative: 1 %
HCT: 51.9 % (ref 39.0–52.0)
Hemoglobin: 17.1 g/dL — ABNORMAL HIGH (ref 13.0–17.0)
Immature Granulocytes: 0 %
Lymphocytes Relative: 8 %
Lymphs Abs: 0.4 10*3/uL — ABNORMAL LOW (ref 0.7–4.0)
MCH: 26.9 pg (ref 26.0–34.0)
MCHC: 32.9 g/dL (ref 30.0–36.0)
MCV: 81.6 fL (ref 80.0–100.0)
Monocytes Absolute: 0.2 10*3/uL (ref 0.1–1.0)
Monocytes Relative: 4 %
Neutro Abs: 5 10*3/uL (ref 1.7–7.7)
Neutrophils Relative %: 87 %
Platelets: 127 10*3/uL — ABNORMAL LOW (ref 150–400)
RBC: 6.36 MIL/uL — ABNORMAL HIGH (ref 4.22–5.81)
RDW: 15.3 % (ref 11.5–15.5)
WBC: 5.7 10*3/uL (ref 4.0–10.5)
nRBC: 0 % (ref 0.0–0.2)

## 2021-10-07 LAB — TROPONIN I (HIGH SENSITIVITY)
Troponin I (High Sensitivity): 4 ng/L (ref <18)
Troponin I (High Sensitivity): 3 ng/L (ref <18)

## 2021-10-07 LAB — COMPREHENSIVE METABOLIC PANEL
ALT: 23 U/L (ref 0–44)
AST: 28 U/L (ref 15–41)
Albumin: 4.2 g/dL (ref 3.5–5.0)
Alkaline Phosphatase: 53 U/L (ref 38–126)
Anion gap: 7 (ref 5–15)
BUN: 28 mg/dL — ABNORMAL HIGH (ref 8–23)
CO2: 28 mmol/L (ref 22–32)
Calcium: 9.1 mg/dL (ref 8.9–10.3)
Chloride: 104 mmol/L (ref 98–111)
Creatinine, Ser: 1.54 mg/dL — ABNORMAL HIGH (ref 0.61–1.24)
GFR, Estimated: 49 mL/min — ABNORMAL LOW (ref >60)
Glucose, Bld: 199 mg/dL — ABNORMAL HIGH (ref 70–99)
Potassium: 4 mmol/L (ref 3.5–5.1)
Sodium: 139 mmol/L (ref 135–145)
Total Bilirubin: 0.9 mg/dL (ref 0.3–1.2)
Total Protein: 7.8 g/dL (ref 6.5–8.1)

## 2021-10-07 LAB — URINALYSIS, COMPLETE (UACMP) WITH MICROSCOPIC
Bacteria, UA: NONE SEEN
Bilirubin Urine: NEGATIVE
Glucose, UA: NEGATIVE mg/dL
Hgb urine dipstick: NEGATIVE
Ketones, ur: NEGATIVE mg/dL
Leukocytes,Ua: NEGATIVE
Nitrite: NEGATIVE
Protein, ur: NEGATIVE mg/dL
Specific Gravity, Urine: 1.043 — ABNORMAL HIGH (ref 1.005–1.030)
Squamous Epithelial / HPF: NONE SEEN (ref 0–5)
pH: 5 (ref 5.0–8.0)

## 2021-10-07 LAB — CBG MONITORING, ED: Glucose-Capillary: 171 mg/dL — ABNORMAL HIGH (ref 70–99)

## 2021-10-07 LAB — PROTIME-INR
INR: 1 (ref 0.8–1.2)
Prothrombin Time: 13.6 seconds (ref 11.4–15.2)

## 2021-10-07 LAB — PROCALCITONIN: Procalcitonin: 11.27 ng/mL

## 2021-10-07 LAB — RESP PANEL BY RT-PCR (FLU A&B, COVID) ARPGX2
Influenza A by PCR: NEGATIVE
Influenza B by PCR: NEGATIVE
SARS Coronavirus 2 by RT PCR: NEGATIVE

## 2021-10-07 LAB — BLOOD GAS, VENOUS
Acid-base deficit: 4.2 mmol/L — ABNORMAL HIGH (ref 0.0–2.0)
Bicarbonate: 22.6 mmol/L (ref 20.0–28.0)
O2 Saturation: 42.1 %
Patient temperature: 37
pCO2, Ven: 47 mmHg (ref 44–60)
pH, Ven: 7.29 (ref 7.25–7.43)
pO2, Ven: 33 mmHg (ref 32–45)

## 2021-10-07 LAB — LACTIC ACID, PLASMA
Lactic Acid, Venous: 2.2 mmol/L (ref 0.5–1.9)
Lactic Acid, Venous: 2.7 mmol/L (ref 0.5–1.9)
Lactic Acid, Venous: 1.6 mmol/L (ref 0.5–1.9)
Lactic Acid, Venous: 1.6 mmol/L (ref 0.5–1.9)

## 2021-10-07 LAB — APTT: aPTT: 20 seconds — ABNORMAL LOW (ref 24–36)

## 2021-10-07 LAB — GLUCOSE, CAPILLARY: Glucose-Capillary: 132 mg/dL — ABNORMAL HIGH (ref 70–99)

## 2021-10-07 MED ORDER — LACTATED RINGERS IV BOLUS (SEPSIS)
1000.0000 mL | Freq: Once | INTRAVENOUS | Status: AC
  Administered 2021-10-07: 1000 mL via INTRAVENOUS

## 2021-10-07 MED ORDER — IOHEXOL 350 MG/ML SOLN
75.0000 mL | Freq: Once | INTRAVENOUS | Status: AC | PRN
  Administered 2021-10-07: 75 mL via INTRAVENOUS

## 2021-10-07 MED ORDER — HYDRALAZINE HCL 20 MG/ML IJ SOLN: 5.0000 mg | Freq: Four times a day (QID) | INTRAMUSCULAR | Status: DC | PRN

## 2021-10-07 MED ORDER — ATORVASTATIN CALCIUM 20 MG PO TABS
20.0000 mg | ORAL_TABLET | Freq: Every day | ORAL | Status: DC
  Administered 2021-10-07 – 2021-10-08 (×2): 20 mg via ORAL
  Filled 2021-10-07 (×2): qty 1

## 2021-10-07 MED ORDER — LACTATED RINGERS IV BOLUS
1000.0000 mL | Freq: Once | INTRAVENOUS | Status: AC
  Administered 2021-10-07: 1000 mL via INTRAVENOUS

## 2021-10-07 MED ORDER — MIDODRINE HCL 5 MG PO TABS: 10.0000 mg | ORAL_TABLET | Freq: Once | ORAL | Status: DC | PRN

## 2021-10-07 MED ORDER — SODIUM CHLORIDE 0.9 % IV SOLN
3.0000 g | Freq: Four times a day (QID) | INTRAVENOUS | Status: DC
  Administered 2021-10-07 – 2021-10-09 (×8): 3 g via INTRAVENOUS
  Filled 2021-10-07 (×2): qty 3
  Filled 2021-10-07: qty 8
  Filled 2021-10-07: qty 3
  Filled 2021-10-07 (×2): qty 8
  Filled 2021-10-07 (×2): qty 3
  Filled 2021-10-07: qty 8

## 2021-10-07 MED ORDER — SODIUM CHLORIDE 0.9 % IV SOLN
500.0000 mg | INTRAVENOUS | Status: DC
  Administered 2021-10-07 – 2021-10-08 (×2): 500 mg via INTRAVENOUS
  Filled 2021-10-07: qty 500
  Filled 2021-10-07: qty 5

## 2021-10-07 MED ORDER — SODIUM CHLORIDE 0.9 % IV SOLN: INTRAVENOUS | Status: DC

## 2021-10-07 MED ORDER — INSULIN ASPART 100 UNIT/ML IJ SOLN
0.0000 [IU] | Freq: Three times a day (TID) | INTRAMUSCULAR | Status: DC
  Administered 2021-10-07: 3 [IU] via SUBCUTANEOUS
  Administered 2021-10-08 – 2021-10-09 (×4): 2 [IU] via SUBCUTANEOUS
  Filled 2021-10-07 (×5): qty 1

## 2021-10-07 MED ORDER — ACETAMINOPHEN 325 MG PO TABS
650.0000 mg | ORAL_TABLET | Freq: Four times a day (QID) | ORAL | Status: DC | PRN
  Administered 2021-10-08 – 2021-10-09 (×2): 650 mg via ORAL
  Filled 2021-10-07 (×2): qty 2

## 2021-10-07 MED ORDER — AMLODIPINE BESYLATE 5 MG PO TABS: 10.0000 mg | ORAL_TABLET | Freq: Every day | ORAL | Status: DC

## 2021-10-07 MED ORDER — LORAZEPAM 2 MG/ML IJ SOLN
2.0000 mg | INTRAMUSCULAR | Status: DC | PRN
Start: 2021-10-07 — End: 2021-10-09

## 2021-10-07 MED ORDER — INSULIN ASPART 100 UNIT/ML IJ SOLN: 0.0000 [IU] | Freq: Every day | INTRAMUSCULAR | Status: DC

## 2021-10-07 MED ORDER — ONDANSETRON HCL 4 MG/2ML IJ SOLN: 4.0000 mg | Freq: Four times a day (QID) | INTRAMUSCULAR | Status: DC | PRN

## 2021-10-07 MED ORDER — SODIUM CHLORIDE 0.9 % IV SOLN: 2.0000 g | INTRAVENOUS | Status: DC

## 2021-10-07 MED ORDER — ENOXAPARIN SODIUM 40 MG/0.4ML IJ SOSY
40.0000 mg | PREFILLED_SYRINGE | Freq: Every day | INTRAMUSCULAR | Status: DC
  Administered 2021-10-07 – 2021-10-08 (×2): 40 mg via SUBCUTANEOUS
  Filled 2021-10-07 (×2): qty 0.4

## 2021-10-07 MED ORDER — SODIUM CHLORIDE 0.9 % IV SOLN
3.0000 g | INTRAVENOUS | Status: AC
  Administered 2021-10-07: 3 g via INTRAVENOUS
  Filled 2021-10-07: qty 8

## 2021-10-07 MED ORDER — ASPIRIN EC 81 MG PO TBEC
81.0000 mg | DELAYED_RELEASE_TABLET | Freq: Every day | ORAL | Status: DC
  Administered 2021-10-07 – 2021-10-09 (×3): 81 mg via ORAL
  Filled 2021-10-07 (×3): qty 1

## 2021-10-07 MED ORDER — ACETAMINOPHEN 650 MG RE SUPP: 650.0000 mg | Freq: Four times a day (QID) | RECTAL | Status: DC | PRN

## 2021-10-07 MED ORDER — ONDANSETRON HCL 4 MG PO TABS: 4.0000 mg | ORAL_TABLET | Freq: Four times a day (QID) | ORAL | Status: DC | PRN

## 2021-10-07 MED ORDER — POLYETHYLENE GLYCOL 3350 17 G PO PACK: 17.0000 g | PACK | Freq: Every day | ORAL | Status: DC | PRN

## 2021-10-07 NOTE — Assessment & Plan Note (Addendum)
-   Patient meeting severe sepsis criteria tachycardia, tachypnea and multilobar pneumonia.  Patient also had lactic acidosis, acute hypoxic respiratory failure with a pulse ox of 85% on room air and acute kidney injury.  Patient started on Unasyn and Zithromax while in the hospital.  The patient converted over to Augmentin and Zithromax for few more days upon discharge. ?

## 2021-10-07 NOTE — ED Notes (Signed)
MD aware of patient's O2 saturations ?

## 2021-10-07 NOTE — Hospital Course (Signed)
Mr. Fin Hupp is a 71 year old male with history of hypertension, hyperlipidemia, non-insulin-dependent diabetes mellitus type 2, BPH, erectile dysfunction, who presents emergency department for seizure-like activity. ? ?Initial vitals in the ED showed Tmax of 100.3, RR of 28, HR of 101, blood pressure 140/110 9, SPO2 of 86% and was placed on 6 L [place] with improvement to 93%. ? ?VBG was ordered and showed 7.2 9/43/23 ? ?Serum sodium 139, potassium 4.0, chloride 104, bicarb 28, BUN 28, serum creatinine of 1.54, nonfasting blood glucose 199, WBC 5.7, hemoglobin 17.1, platelets of 127. ? ?COVID/influenza A/influenza B PCR were negative. High sensitive troponins were negative at 4 and decreased to 3. ? ?Blood cultures, single culture is currently in process. ? ?Lactic acid was 2.2 and increased to 2.7. ? ?Portable chest x-ray was ordered and showed no active disease. ? ?CTA of the chest for PE was ordered by EDP and read: Multi lobar pneumonia or aspiration.  Negative for PE.  Fluid distended stomach to a moderate degree.  Right adrenal adenoma. ? ?ED treatment: One-time dose of Unasyn 3 g, LR 1 L x 2. ?

## 2021-10-07 NOTE — Assessment & Plan Note (Addendum)
Creatinine 1.54 on admission and down to 1.32 currently.  Baseline creatinine ranging between 1.13 and 1.26.  Recommend checking BMP as outpatient. ?
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
## 2021-10-07 NOTE — Assessment & Plan Note (Addendum)
Holding blood pressure medications.  Blood pressure normal holding blood pressure medications.  He will use his blood blood pressure cuff at home and keep a log for his PMD.  May be able to go back on 1 medication at a time if blood pressure starts to rise. ?

## 2021-10-07 NOTE — ED Triage Notes (Signed)
To room 10 via [facility] from home with c/o seizure activity. Pt states he felt bad like he had indigestion, family witnessed seizure like activity apx 5-10 mins upon returning to room. Pt A&O x 4 upon ems to residence arrival, only c/o nausea. 02 sat 85% RA.  ?

## 2021-10-07 NOTE — ED Provider Notes (Signed)
? ?[facility] ?Provider Note ? ? ? Event Date/Time  ? First MD Initiated Contact with Patient 10/07/21 0235   ?  (approximate) ? ? ?History  ? ?Seizures ? ? ?HPI ?Level 5 caveat:  history/ROS limited by acute/critical illness ? ?Donald Le is a 71 y.o. male whose medical history includes hypertension, diabetes, hyperlipidemia, and prior episode of severe sepsis.  He also reportedly has had at least 1 seizure-like episode in the past but that was years ago.  He presents tonight by EMS for apparent seizure at home. ? ?The patient is currently awake and alert.  He says that he did not feel well over the course of the day due to "an upset stomach".  However he said that is not unusual for him.  He said that he got up and went to the bathroom tonight and felt little bit better and then got back in bed, but afterwards his wife observed him having seizure-like activity for perhaps 5 to 10 minutes total.  She called EMS and by the time they arrived, they reported that he was back to normal.  If he had any episode of confusion it did not seem to last long. ? ?The patient is denying any shortness of breath, but his oxygen saturation for EMS was about 85% on room air.  He remains hypoxemic upon arrival to the emergency department in spite of supplemental oxygen. ? ?The patient currently denies any symptoms.  He said that his stomach still feels a little bit upset but he is not nauseated and he has no pain.  He denies headache, loss of bladder or bowel control, and he continues to deny shortness of breath as well as chest pain. ?  ? ? ?Physical Exam  ? ?Triage Vital Signs: ?ED Triage Vitals  ?Enc Vitals Group  ?   BP 10/07/21 0237 98/70  ?   Pulse Rate 10/07/21 0237 84  ?   Resp 10/07/21 0237 11  ?   Temp 10/07/21 0240 99.2 ?F (37.3 ?C)  ?   Temp Source 10/07/21 0240 Rectal  ?   SpO2 10/07/21 0237 (!) 85 %  ?   Weight 10/07/21 0235 96.6 kg (213 lb)  ?   Height 10/07/21 0235 1.778 m ('5\' 10"'$ )  ?    Head Circumference --   ?   Peak Flow --   ?   Pain Score 10/07/21 0235 0  ?   Pain Loc --   ?   Pain Edu? --   ?   Excl. in [place]? --   ? ? ?Most recent vital signs: ?Vitals:  ? 10/07/21 0630 10/07/21 0700  ?BP: 102/67 102/73  ?Pulse: (!) 113 (!) 112  ?Resp: (!) 35 (!) 28  ?Temp:    ?SpO2: 96% 96%  ? ? ? ?General: Awake, alert, able to answer questions without difficulty. ?CV:  Good peripheral perfusion.  Normal heart sounds ?Resp:  Normal effort.  Lungs clear to auscultation. ?Abd:  Mild distention, but this may be his baseline.  No tenderness to palpation throughout the abdomen. ?Other:  No focal neurological deficits, moving all 4 extremities without difficulty, normal grip strength bilaterally. ? ? ?ED Results / Procedures / Treatments  ? ?Labs ?(all labs ordered are listed, but only abnormal results are displayed) ?Labs Reviewed  ?LACTIC ACID, PLASMA - Abnormal; Notable for the following components:  ?    Result Value  ? Lactic Acid, Venous 2.2 (*)   ? All other components  within normal limits  ?COMPREHENSIVE METABOLIC PANEL - Abnormal; Notable for the following components:  ? Glucose, Bld 199 (*)   ? BUN 28 (*)   ? Creatinine, Ser 1.54 (*)   ? GFR, Estimated 49 (*)   ? All other components within normal limits  ?CBC WITH DIFFERENTIAL/PLATELET - Abnormal; Notable for the following components:  ? RBC 6.36 (*)   ? Hemoglobin 17.1 (*)   ? Platelets 127 (*)   ? Lymphs Abs 0.4 (*)   ? All other components within normal limits  ?APTT - Abnormal; Notable for the following components:  ? aPTT 20 (*)   ? All other components within normal limits  ?BLOOD GAS, VENOUS - Abnormal; Notable for the following components:  ? Acid-base deficit 4.2 (*)   ? All other components within normal limits  ?LACTIC ACID, PLASMA - Abnormal; Notable for the following components:  ? Lactic Acid, Venous 2.7 (*)   ? All other components within normal limits  ?CULTURE, BLOOD (SINGLE)  ?RESP PANEL BY RT-PCR (FLU A&B, COVID) [id number]  ?URINE CULTURE   ?MRSA NEXT GEN BY PCR, NASAL  ?CULTURE, BLOOD (SINGLE)  ?PROTIME-INR  ?URINALYSIS, COMPLETE (UACMP) WITH MICROSCOPIC  ?HIV ANTIBODY (ROUTINE TESTING W REFLEX)  ?PROCALCITONIN  ?LACTIC ACID, PLASMA  ?LACTIC ACID, PLASMA  ?TROPONIN I (HIGH SENSITIVITY)  ?TROPONIN I (HIGH SENSITIVITY)  ? ? ? ?EKG ? ?ED ECG REPORT ?IHinda Kehr, the attending physician, personally viewed and interpreted this ECG. ? ?Date: 10/07/2021 ?EKG Time: 2:38 AM ?Rate: 93 ?Rhythm: normal sinus rhythm ?QRS Axis: normal ?Intervals: Left anterior fascicular block ?ST/T Wave abnormalities: Non-specific ST segment / T-wave changes, but no clear evidence of acute ischemia. ?Narrative Interpretation: no definitive evidence of acute ischemia; does not meet STEMI criteria. ? ? ? ?RADIOLOGY ?I personally viewed and interpreted the patient's chest x-ray and saw no evidence of pulmonary edema or pneumonia. ? ?I subsequently personally viewed and interpreted the patient's CT angio chest.  I saw no evidence of pulmonary embolism but what appears to be pneumonia is clearly present in multiple lobes.   ? ? ? ?PROCEDURES: ? ?Critical Care performed: Yes, see critical care procedure note(s) ? ?.1-3 Lead EKG Interpretation ?Performed by: Hinda Kehr, MD ?Authorized by: Hinda Kehr, MD  ? ?  Interpretation: abnormal   ?  ECG rate:  115 ?  ECG rate assessment: tachycardic   ?  Rhythm: sinus tachycardia   ?  Ectopy: none   ?  Conduction: normal   ?.Critical Care ?Performed by: Hinda Kehr, MD ?Authorized by: Hinda Kehr, MD  ? ?Critical care provider statement:  ?  Critical care time (minutes):  45 ?  Critical care time was exclusive of:  Separately billable procedures and treating other patients ?  Critical care was necessary to treat or prevent imminent or life-threatening deterioration of the following conditions:  Sepsis and respiratory failure ?  Critical care was time spent personally by me on the following activities:  Development of treatment plan  with patient or surrogate, evaluation of patient's response to treatment, examination of patient, obtaining history from patient or surrogate, ordering and performing treatments and interventions, ordering and review of laboratory studies, ordering and review of radiographic studies, pulse oximetry, re-evaluation of patient's condition and review of old charts ? ? ?MEDICATIONS ORDERED IN ED: ?Medications  ?atorvastatin (LIPITOR) tablet 20 mg (has no administration in time range)  ?aspirin EC tablet 81 mg (has no administration in time range)  ?acetaminophen (TYLENOL) tablet 650 mg (  has no administration in time range)  ?  Or  ?acetaminophen (TYLENOL) suppository 650 mg (has no administration in time range)  ?ondansetron (ZOFRAN) tablet 4 mg (has no administration in time range)  ?  Or  ?ondansetron (ZOFRAN) injection 4 mg (has no administration in time range)  ?enoxaparin (LOVENOX) injection 40 mg (has no administration in time range)  ?0.9 %  sodium chloride infusion (has no administration in time range)  ?azithromycin (ZITHROMAX) 500 mg in sodium chloride 0.9 % 250 mL IVPB (has no administration in time range)  ?polyethylene glycol (MIRALAX / GLYCOLAX) packet 17 g (has no administration in time range)  ?midodrine (PROAMATINE) tablet 10 mg (has no administration in time range)  ?Ampicillin-Sulbactam (UNASYN) 3 g in sodium chloride 0.9 % 100 mL IVPB (has no administration in time range)  ?hydrALAZINE (APRESOLINE) injection 5 mg (has no administration in time range)  ?LORazepam (ATIVAN) injection 2 mg (has no administration in time range)  ?lactated ringers bolus 1,000 mL (0 mLs Intravenous Stopped 10/07/21 0354)  ?iohexol (OMNIPAQUE) 350 MG/ML injection 75 mL (75 mLs Intravenous Contrast Given 10/07/21 0420)  ?Ampicillin-Sulbactam (UNASYN) 3 g in sodium chloride 0.9 % 100 mL IVPB (0 g Intravenous Stopped 10/07/21 0702)  ?lactated ringers bolus 1,000 mL (0 mLs Intravenous Stopped 10/07/21 0745)  ? ? ? ?IMPRESSION / MDM /  ASSESSMENT AND PLAN / ED COURSE  ?I reviewed the triage vital signs and the nursing notes. ?             ?               ? ?Differential diagnosis includes, but is not limited to, seizure, acute infec

## 2021-10-07 NOTE — Assessment & Plan Note (Deleted)
Continue atorvastatin

## 2021-10-07 NOTE — ED Notes (Signed)
RN to bedside to introduce self to pt. Pt family at bedside. Hooked pt back up to oxygen and monitor. He had ambulated to toilet to use the restroom.  ?

## 2021-10-07 NOTE — Assessment & Plan Note (Addendum)
Appreciate inpatient neurology consultation.  EEG negative.  No seizure medications needed at this time.  Recommended outpatient follow-up with neurology.  No driving until seen by outpatient neurology ?

## 2021-10-07 NOTE — Consult Note (Signed)
Pharmacy Antibiotic Note ? ?Donald Le is a 71 y.o. male with medical history including HTN, HLD, BPH, DM admitted on 10/07/2021 with seizure like activity and hypoxia. Imaging concerning for multilobar pneumonia or aspiration. Pharmacy has been consulted for Unasyn dosing. ? ?Plan: ? ?Unasyn 3 g IV q6h ? ?Height: '5\' 10"'$  (177.8 cm) ?Weight: 96.6 kg (213 lb) ?IBW/kg (Calculated) : 73 ? ?Temp (24hrs), Avg:99.8 ?F (37.7 ?C), Min:99.2 ?F (37.3 ?C), Max:100.3 ?F (37.9 ?C) ? ?Recent Labs  ?Lab 10/07/21 ?0236 10/07/21 ?8828 10/07/21 ?0610  ?WBC 5.7  --   --   ?CREATININE 1.54*  --   --   ?LATICACIDVEN  --  2.2* 2.7*  ?  ?Estimated Creatinine Clearance: 52.8 mL/min (A) (by C-G formula based on SCr of 1.54 mg/dL (H)).   ? ?Allergies  ?Allergen Reactions  ? Ace Inhibitors Other (See Comments)  ?  ARF  ? ? ?Antimicrobials this admission: ?Azithromycin 5/13 >>  ?Unasyn 5/13 >>  ? ?Dose adjustments this admission: ?N/A ? ?Microbiology results: ?5/13 BCx: NGTD ?5/13 UCx: pending  ?5/13 MRSA PCR: pending ? ?Thank you for allowing pharmacy to be a part of this patient?s care. ? ?Benita Gutter ?10/07/2021 7:38 AM ? ?

## 2021-10-07 NOTE — Assessment & Plan Note (Deleted)
-   Multilobular pneumonia, bilateral, right greater than left ?- Concerning for aspiration pneumonia ?- Status post Unasyn per EDP ?- We will continue with Unasyn per pharmacy for Pneumonia ?- Added azithromycin for atypical coverage ?- We will continue to follow lactic acid until downtrending ?

## 2021-10-08 DIAGNOSIS — E1159 Type 2 diabetes mellitus with other circulatory complications: Secondary | ICD-10-CM

## 2021-10-08 DIAGNOSIS — A419 Sepsis, unspecified organism: Secondary | ICD-10-CM | POA: Diagnosis not present

## 2021-10-08 DIAGNOSIS — J9601 Acute respiratory failure with hypoxia: Secondary | ICD-10-CM | POA: Diagnosis not present

## 2021-10-08 DIAGNOSIS — R569 Unspecified convulsions: Secondary | ICD-10-CM | POA: Diagnosis not present

## 2021-10-08 DIAGNOSIS — J189 Pneumonia, unspecified organism: Secondary | ICD-10-CM | POA: Diagnosis not present

## 2021-10-08 DIAGNOSIS — I152 Hypertension secondary to endocrine disorders: Secondary | ICD-10-CM

## 2021-10-08 DIAGNOSIS — E785 Hyperlipidemia, unspecified: Secondary | ICD-10-CM

## 2021-10-08 DIAGNOSIS — D3501 Benign neoplasm of right adrenal gland: Secondary | ICD-10-CM

## 2021-10-08 DIAGNOSIS — E1169 Type 2 diabetes mellitus with other specified complication: Secondary | ICD-10-CM

## 2021-10-08 DIAGNOSIS — N179 Acute kidney failure, unspecified: Secondary | ICD-10-CM

## 2021-10-08 LAB — GLUCOSE, CAPILLARY
Glucose-Capillary: 126 mg/dL — ABNORMAL HIGH (ref 70–99)
Glucose-Capillary: 121 mg/dL — ABNORMAL HIGH (ref 70–99)
Glucose-Capillary: 117 mg/dL — ABNORMAL HIGH (ref 70–99)

## 2021-10-08 LAB — HEMOGLOBIN A1C
Hgb A1c MFr Bld: 6.9 % — ABNORMAL HIGH (ref 4.8–5.6)
Mean Plasma Glucose: 151.33 mg/dL

## 2021-10-08 LAB — CORTISOL-AM, BLOOD: Cortisol - AM: 22.7 ug/dL — ABNORMAL HIGH (ref 6.7–22.6)

## 2021-10-08 LAB — HIV ANTIBODY (ROUTINE TESTING W REFLEX): HIV Screen 4th Generation wRfx: NONREACTIVE

## 2021-10-08 MED ORDER — GUAIFENESIN-DM 100-10 MG/5ML PO SYRP
5.0000 mL | ORAL_SOLUTION | ORAL | Status: DC | PRN
  Administered 2021-10-08 – 2021-10-09 (×2): 5 mL via ORAL
  Filled 2021-10-08 (×2): qty 10

## 2021-10-08 MED ORDER — IPRATROPIUM-ALBUTEROL 0.5-2.5 (3) MG/3ML IN SOLN
3.0000 mL | Freq: Three times a day (TID) | RESPIRATORY_TRACT | Status: DC
  Administered 2021-10-08 (×2): 3 mL via RESPIRATORY_TRACT
  Filled 2021-10-08: qty 3

## 2021-10-08 MED ORDER — AZITHROMYCIN 500 MG PO TABS
500.0000 mg | ORAL_TABLET | Freq: Every day | ORAL | Status: DC
  Administered 2021-10-09: 500 mg via ORAL
  Filled 2021-10-08: qty 1

## 2021-10-08 NOTE — Progress Notes (Signed)
PHARMACIST - PHYSICIAN COMMUNICATION ? ?CONCERNING: Antibiotic IV to Oral Route Change Policy ? ?RECOMMENDATION: ?This patient is receiving azithromycin by the intravenous route.  Based on criteria approved by the Pharmacy and Therapeutics Committee, the antibiotic(s) is/are being converted to the equivalent oral dose form(s). ? ? ?DESCRIPTION: ?These criteria include: ?Patient being treated for a respiratory tract infection, urinary tract infection, cellulitis or clostridium difficile associated diarrhea if on metronidazole ?The patient is not neutropenic and does not exhibit a GI malabsorption state ?The patient is eating (either orally or via tube) and/or has been taking other orally administered medications for a least 24 hours ?The patient is improving clinically and has a Tmax < 100.5 ? ?If you have questions about this conversion, please contact the Pharmacy Department  ? ?Benita Gutter  ?10/08/21  ?  ?

## 2021-10-08 NOTE — Assessment & Plan Note (Addendum)
Hemoglobin A1c 6.9.  Continue atorvastatin and diet control. ?

## 2021-10-08 NOTE — Assessment & Plan Note (Addendum)
2.6 cm right adrenal nodule consistent with adenoma. Will need follow-up as outpatient to see if this is growing in size or active. ?

## 2021-10-08 NOTE — Progress Notes (Signed)
?Progress Note ? ? ?Patient: Donald Le VQQ:[id number] DOB: 04/23/1951 DOA: 10/07/2021     1 ?DOS: the patient was seen and examined on 10/08/2021 ? ? ?Assessment and Plan: ?* Severe sepsis with acute organ dysfunction ([place]) ?- Patient meeting severe sepsis criteria tachycardia, tachypnea and multilobar pneumonia.  Patient also had lactic acidosis, acute hypoxic respiratory failure with a pulse ox of 85% on room air and acute kidney injury.  Patient started on Unasyn and Zithromax. ? ?Seizure-like activity ([place]) ?- Asked for neurology evaluation on whether they think this is a seizure or not. ? ?Multifocal pneumonia ?Patient with bilateral pneumonia.  Started on Unasyn and Zithromax.  Could be an aspiration with the vomiting. ? ?Acute respiratory failure with hypoxia ([place]) ?Patient with a pulse ox of 85% on room air.  This morning on 3 L.  We will check a pulse ox on room air. ? ?AKI (acute kidney injury) ([place]) ?Creatinine 1.54 on admission.  Baseline creatinine ranging between 1.13 and 1.26.  Recheck BMP tomorrow. ? ?Hypertension associated with diabetes ([place]) ?Holding blood pressure medications. ? ?Adrenal adenoma, right ?2.6 cm right adrenal nodule consistent with adenoma.  Will need follow-up as outpatient to see if this is growing in size or active. ? ?Type 2 diabetes mellitus with hyperlipidemia ([place]) ?Continue atorvastatin and sliding scale insulin for now ? ? ? ? ?  ? ?Subjective: Patient feeling better now.  At home felt very nauseous and then noted to have some shaking episodes and staring out into space.  He vomited and then felt better.  Wife noted that he was shaking and stiff and staring out into space.  He did not have any tongue bite or loss of urine.  After the episode and 5 to 10 minutes he was able to talk and was not confused. ? ?Physical Exam: ?Vitals:  ? 10/08/21 0102 10/08/21 4332 10/08/21 0719 10/08/21 1127  ?BP: 106/70 114/73 (!) 97/58 117/66  ?Pulse: 79 85 75 77  ?Resp: '16 18 18 17   '$ ?Temp: 98 ?F (36.7 ?C) 98.4 ?F (36.9 ?C) 98.1 ?F (36.7 ?C) 98.2 ?F (36.8 ?C)  ?TempSrc:   Oral Oral  ?SpO2: 95% 100% 96% 95%  ?Weight:      ?Height:      ? ?Physical Exam ?HENT:  ?   Head: Normocephalic.  ?   Mouth/Throat:  ?   Pharynx: No oropharyngeal exudate.  ?Eyes:  ?   General: Lids are normal.  ?   Conjunctiva/sclera: Conjunctivae normal.  ?Cardiovascular:  ?   Rate and Rhythm: Normal rate and regular rhythm.  ?   Heart sounds: Normal heart sounds, S1 normal and S2 normal.  ?Pulmonary:  ?   Breath sounds: No decreased breath sounds, wheezing, rhonchi or rales.  ?Abdominal:  ?   Palpations: Abdomen is soft.  ?   Tenderness: There is no abdominal tenderness.  ?Musculoskeletal:  ?   Right lower leg: Swelling present.  ?   Left lower leg: Swelling present.  ?Skin: ?   General: Skin is warm.  ?   Findings: No rash.  ?Neurological:  ?   Mental Status: He is alert and oriented to person, place, and time.  ?  ?Data Reviewed: ?Procalcitonin 11.27.  Lactic acid peaked at 2.7 and improved to normal range, hemoglobin 17.1, platelet count 127, creatinine 1.54 ? ?Family Communication: Spoke with wife at the bedside ? ?Disposition: ?Status is: Inpatient ?Remains inpatient appropriate because: We will see if we can get her off oxygen  and treating for sepsis and pneumonia ?Planned Discharge Destination: Home ? ? ?Author: ?Loletha Grayer, MD ?10/08/2021 1:22 PM ? ?For on call review [email or web address].  ?

## 2021-10-08 NOTE — Assessment & Plan Note (Addendum)
Patient with a pulse ox of 85% on room air.  Today patient was able to come off the oxygen and saturated well with ambulation with nursing staff. ?

## 2021-10-08 NOTE — Assessment & Plan Note (Addendum)
Patient with bilateral pneumonia.  Started on Unasyn and Zithromax.  Will prescribe Augmentin and Zithromax upon discharge home. ?

## 2021-10-09 DIAGNOSIS — A419 Sepsis, unspecified organism: Secondary | ICD-10-CM | POA: Diagnosis not present

## 2021-10-09 DIAGNOSIS — J9601 Acute respiratory failure with hypoxia: Secondary | ICD-10-CM | POA: Diagnosis not present

## 2021-10-09 DIAGNOSIS — J189 Pneumonia, unspecified organism: Secondary | ICD-10-CM | POA: Diagnosis not present

## 2021-10-09 DIAGNOSIS — N179 Acute kidney failure, unspecified: Secondary | ICD-10-CM | POA: Diagnosis not present

## 2021-10-09 LAB — CBC
HCT: 38.7 % — ABNORMAL LOW (ref 39.0–52.0)
Hemoglobin: 12.9 g/dL — ABNORMAL LOW (ref 13.0–17.0)
MCH: 26.7 pg (ref 26.0–34.0)
MCHC: 33.3 g/dL (ref 30.0–36.0)
MCV: 80 fL (ref 80.0–100.0)
Platelets: 124 10*3/uL — ABNORMAL LOW (ref 150–400)
RBC: 4.84 MIL/uL (ref 4.22–5.81)
RDW: 14.4 % (ref 11.5–15.5)
WBC: 8.6 10*3/uL (ref 4.0–10.5)
nRBC: 0 % (ref 0.0–0.2)

## 2021-10-09 LAB — BASIC METABOLIC PANEL
Anion gap: 8 (ref 5–15)
BUN: 16 mg/dL (ref 8–23)
CO2: 27 mmol/L (ref 22–32)
Calcium: 8 mg/dL — ABNORMAL LOW (ref 8.9–10.3)
Chloride: 106 mmol/L (ref 98–111)
Creatinine, Ser: 1.32 mg/dL — ABNORMAL HIGH (ref 0.61–1.24)
GFR, Estimated: 58 mL/min — ABNORMAL LOW (ref >60)
Glucose, Bld: 123 mg/dL — ABNORMAL HIGH (ref 70–99)
Potassium: 3.4 mmol/L — ABNORMAL LOW (ref 3.5–5.1)
Sodium: 141 mmol/L (ref 135–145)

## 2021-10-09 LAB — GLUCOSE, CAPILLARY
Glucose-Capillary: 135 mg/dL — ABNORMAL HIGH (ref 70–99)
Glucose-Capillary: 130 mg/dL — ABNORMAL HIGH (ref 70–99)

## 2021-10-09 LAB — URINE CULTURE: Culture: NO GROWTH

## 2021-10-09 LAB — MAGNESIUM: Magnesium: 1.9 mg/dL (ref 1.7–2.4)

## 2021-10-09 MED ORDER — IPRATROPIUM-ALBUTEROL 0.5-2.5 (3) MG/3ML IN SOLN: 3.0000 mL | Freq: Four times a day (QID) | RESPIRATORY_TRACT | Status: DC | PRN

## 2021-10-09 MED ORDER — AMOXICILLIN-POT CLAVULANATE 875-125 MG PO TABS: 1.0000 | ORAL_TABLET | Freq: Two times a day (BID) | ORAL | 0 refills | Status: AC

## 2021-10-09 MED ORDER — AZITHROMYCIN 250 MG PO TABS: 250.0000 mg | ORAL_TABLET | Freq: Every day | ORAL | 0 refills | Status: AC

## 2021-10-09 NOTE — Progress Notes (Signed)
Received MD order to discharge patient to e, reviewed discharge instructions, home meds, prescriptions and follow  up appointments with patient and patient verbalized understanding    ?

## 2021-10-09 NOTE — Progress Notes (Signed)
Eeg done 

## 2021-10-09 NOTE — Discharge Instructions (Addendum)
No driving until cleared by Neurology ? ?Check your blood pressure daily and keep a log for your medical doctor ?

## 2021-10-09 NOTE — Consult Note (Signed)
Neurology Consultation ?Reason for Consult: Possible seizure ?Referring Physician: Leslye Peer, R ? ?CC: Possible seizure ? ?History is obtained from: Patient, wife ? ?HPI: Donald Le is a 71 y.o. male with a history of two episodes of syncope versus seizure.  The first episode occurred in November of last year. ? ?It was described as commencing with feeling sick to his stomach, then while his wife was driving she noticed him breathing heavy and when she looked over he was arching his back and his eyes were rolled back in his head.  He then gradually became more responsive.  He did urinate on himself at that time. ? ?With the current episode, he had been feeling ill, complaining of an upset stomach.  He then had emesis, and became stiff and unresponsive for 5 to 10 minutes.  Following this, it was about 10 minutes prior to him beginning to speak again.  He was hypoxemic upon arrival to the ED.  He continued to have some upset stomach after arrival.   ? ? ? ?ROS: A 14 point ROS was performed and is negative except as noted in the HPI.  ?Past Medical History:  ?Diagnosis Date  ? Hyperlipidemia   ? Hyperlipidemia associated with type 2 diabetes mellitus ([place]) 03/09/2020  ? Hypertension   ? Thyroid disease   ? Grave's disease  ? ? ? ?Family History  ?Problem Relation Age of Onset  ? Hypertension Mother   ? Stroke Mother   ? Cancer Mother   ? Alcohol abuse Father   ? Hypertension Sister   ? Thyroid disease Sister   ? Hypertension Brother   ? Heart attack Brother   ? Prostate cancer Neg Hx   ? Bladder Cancer Neg Hx   ? Kidney cancer Neg Hx   ? ? ? ?Social History:  reports that he quit smoking about 40 years ago. His smoking use included cigarettes. He has never used smokeless tobacco. He reports that he does not drink alcohol and does not use drugs. ? ? ?Exam: ?Current vital signs: ?BP 127/78 (BP Location: Right Arm)   Pulse 69   Temp 98 ?F (36.7 ?C) (Oral)   Resp 16   Ht '5\' 10"'$  (1.778 m)   Wt 96.6 kg   SpO2  97%   BMI 30.56 kg/m?  ?Vital signs in last 24 hours: ?Temp:  [98 ?F (36.7 ?C)-99.6 ?F (37.6 ?C)] 98 ?F (36.7 ?C) (05/15 4132) ?Pulse Rate:  [69-97] 69 (05/15 0727) ?Resp:  [16-20] 16 (05/15 0727) ?BP: (117-135)/(65-85) 127/78 (05/15 0727) ?SpO2:  [90 %-97 %] 97 % (05/15 0727) ? ? ?Physical Exam  ?Constitutional: Appears well-developed and well-nourished.  ? ? ?Neuro: ?Mental Status: ?Patient is awake, alert, oriented to person, place, month, year, and situation. ?Patient is able to give a clear and coherent history. ?No signs of aphasia or neglect ?Cranial Nerves: ?II: Visual Fields are full. Pupils are equal, round, and reactive to light.   ?III,IV, VI: EOMI without ptosis or diploplia.  ?V: Facial sensation is symmetric to temperature ?VII: Facial movement is symmetric.  ?VIII: hearing is intact to voice ?X: Uvula elevates symmetrically ?XI: Shoulder shrug is symmetric. ?XII: tongue is midline without atrophy or fasciculations.  ?Motor: ?Tone is normal. Bulk is normal. 5/5 strength was present in all four extremities.  ?Sensory: ?Sensation is symmetric to light touch and temperature in the arms and legs. ?Cerebellar: ?No ataxia on finger-nose-finger ? ? ? ? ?I have reviewed labs in epic and the results pertinent  to this consultation are: ?Creatinine 1.32 ?Sodium 141 ?Calcium 9.1 on arrival ? ?I have reviewed the images obtained: CT head reveals atrophy, no other acute changes.(Of note I noticed an MRI performed in 2010 for an episode of syncope) ? ?Impression: 71 year old male with syncope versus seizure in the setting of nausea vomiting.  Both the episode from November as well as this episode have some features of concern for seizure as well as some features more suspicious for vagal episodes.  Both were precipitated with vomiting/GI symptoms and the first occurred shortly after a biopsy.  I do think that an EEG is indicated, but if this is negative I am not certain that I would start him on antiepileptics at  this time based on clinical suspicion alone. ? ?Recommendations: ?1) EEG ?2) if he were to have any further spells of concerns, would start empiric antiepileptic trial at that time. ? ? ? ?Roland Rack, MD ?[facility] ?[phone] ? ?If 7pm- 7am, please page neurology on call as listed in [place]. ? ?

## 2021-10-09 NOTE — Procedures (Addendum)
History: 71 year old male being evaluated for seizure versus syncope ? ?Sedation: None ? ?Technique: This EEG was acquired with electrodes placed according to the International 10-20 electrode system (including Fp1, Fp2, F3, F4, C3, C4, P3, P4, O1, O2, T3, T4, T5, T6, A1, A2, Fz, Cz, Pz). The following electrodes were missing or displaced: none. ? ? ?Background: The background consists of intermixed alpha and beta activities. There is a well defined posterior dominant rhythm of nine hz that attenuates with eye opening. Sleep is recorded with normal appearing structures.  ? ?Photic stimulation: Physiologic driving is present ? ?EEG Abnormalities: None ? ?Clinical Interpretation: This normal EEG is recorded in the waking and sleep state. There was no seizure or seizure predisposition recorded on this study. Please note that lack of epileptiform activity on EEG does not preclude the possibility of epilepsy.  ? ?Roland Rack, MD ?[facility] ?[phone] ? ?If 7pm- 7am, please page neurology on call as listed in [place]. ? ?

## 2021-10-09 NOTE — Discharge Summary (Signed)
?Physician Discharge Summary ?  ?Patient: Donald Le MRN: [id number] DOB: 1950-07-19  ?Admit date:     10/07/2021  ?Discharge date: 10/09/21  ?Discharge Physician: Loletha Grayer  ? ?PCP: Charlynne Cousins, MD  ? ?Recommendations at discharge:  ? ?Follow-up with PCP 5 days ?No driving until cleared by neurology as outpatient to drive. ? ?Discharge Diagnoses: ?Principal Problem: ?  Severe sepsis with acute organ dysfunction ([place]) ?Active Problems: ?  Seizure-like activity ([place]) ?  Multifocal pneumonia ?  Acute respiratory failure with hypoxemia (HCC) ?  AKI (acute kidney injury) ([place]) ?  Hypertension associated with diabetes ([place]) ?  BPH (benign prostatic hyperplasia) ?  Type 2 diabetes mellitus with hyperlipidemia ([place]) ?  Diabetes mellitus without complication ([place]) ?  Adrenal adenoma, right ? ? ? ?Hospital Course: ?[name] is a 71 year old male with history of hypertension, hyperlipidemia, non-insulin-dependent diabetes mellitus type 2, BPH, erectile dysfunction, who presents emergency department for seizure-like activity. ? ?Initial vitals in the ED showed Tmax of 100.3, RR of 28, HR of 101, blood pressure 140/110 9, SPO2 of 86% and was placed on 6 L [place] with improvement to 93%. ? ?VBG was ordered and showed 7.2 9/43/23 ? ?Serum sodium 139, potassium 4.0, chloride 104, bicarb 28, BUN 28, serum creatinine of 1.54, nonfasting blood glucose 199, WBC 5.7, hemoglobin 17.1, platelets of 127. ? ?COVID/influenza A/influenza B PCR were negative. High sensitive troponins were negative at 4 and decreased to 3. ? ?Blood cultures, to date are negative ? ?Lactic acid was 2.2 and increased to 2.7. ? ?Portable chest x-ray was ordered and showed no active disease. ? ?CTA of the chest for PE was ordered by EDP and read: Multi lobar pneumonia or aspiration.  Negative for PE.  Fluid distended stomach to a moderate degree.  Right adrenal adenoma. ? ?ED treatment: One-time dose of Unasyn 3 g, LR 1 L x 2. ? ?The patient  was unable to come off oxygen on 10/08/2021 but ambulated well and responded to incentive spirometer and nebulizer treatments and antibiotics while here in the hospital.  Patient was able to come off oxygen on 10/09/2021. ? ?Patient was seen by neurology for potential seizure.  No tongue bite or loss of urine or bowel function.  EEG was negative.  Inpatient neurology recommended outpatient neurology follow-up.  No driving until cleared by outpatient neurology.  No seizure medications needed at this time. ? ?Assessment and Plan: ?* Severe sepsis with acute organ dysfunction ([place]) ?- Patient meeting severe sepsis criteria tachycardia, tachypnea and multilobar pneumonia.  Patient also had lactic acidosis, acute hypoxic respiratory failure with a pulse ox of 85% on room air and acute kidney injury.  Patient started on Unasyn and Zithromax while in the hospital.  The patient converted over to Augmentin and Zithromax for few more days upon discharge. ? ?Seizure-like activity ([place]) ?Appreciate inpatient neurology consultation.  EEG negative.  No seizure medications needed at this time.  Recommended outpatient follow-up with neurology.  No driving until seen by outpatient neurology ? ?Multifocal pneumonia ?Patient with bilateral pneumonia.  Started on Unasyn and Zithromax.  Will prescribe Augmentin and Zithromax upon discharge home. ? ?Acute respiratory failure with hypoxemia ([place]) ?Patient with a pulse ox of 85% on room air.  Today patient was able to come off the oxygen and saturated well with ambulation with nursing staff. ? ?AKI (acute kidney injury) ([place]) ?Creatinine 1.54 on admission and down to 1.32 currently.  Baseline creatinine ranging between 1.13 and 1.26.  Recommend  checking BMP as outpatient. ? ?Hypertension associated with diabetes ([place]) ?Holding blood pressure medications.  Blood pressure normal holding blood pressure medications.  He will use his blood blood pressure cuff at home and keep a log for his PMD.   May be able to go back on 1 medication at a time if blood pressure starts to rise. ? ?Adrenal adenoma, right ?2.6 cm right adrenal nodule consistent with adenoma. Will need follow-up as outpatient to see if this is growing in size or active. ? ?Type 2 diabetes mellitus with hyperlipidemia ([place]) ?Hemoglobin A1c 6.9.  Continue atorvastatin and diet control. ? ? ? ? ?  ? ? ?Consultants: Neurology ?Procedures performed: EEG ?Disposition: Home ?Diet recommendation:  ?Cardiac and Carb modified diet ?DISCHARGE MEDICATION: ?Allergies as of 10/09/2021   ? ?   Reactions  ? Ace Inhibitors Other (See Comments)  ? ARF  ? ?  ? ?  ?Medication List  ?  ? ?STOP taking these medications   ? ?amLODipine 10 MG tablet ?Commonly known as: NORVASC ?  ?hydrochlorothiazide 25 MG tablet ?Commonly known as: HYDRODIURIL ?  ?losartan 100 MG tablet ?Commonly known as: COZAAR ?  ?naproxen sodium 220 MG tablet ?Commonly known as: ALEVE ?  ?NONFORMULARY OR COMPOUNDED ITEM ?  ?PROSTATE HEALTH PO ?  ?tadalafil 10 MG tablet ?Commonly known as: Cialis ?  ? ?  ? ?TAKE these medications   ? ?amoxicillin-clavulanate 875-125 MG tablet ?Commonly known as: Augmentin ?Take 1 tablet by mouth 2 (two) times daily for 3 days. ?  ?aspirin EC 81 MG tablet ?Take 81 mg by mouth daily. ?  ?atorvastatin 40 MG tablet ?Commonly known as: LIPITOR ?TAKE 0.5 TABLETS (20 MG TOTAL) BY MOUTH DAILY. 1/2 TABLET DAILY ?  ?azithromycin 250 MG tablet ?Commonly known as: ZITHROMAX ?Take 1 tablet (250 mg total) by mouth daily for 2 days. ?Start taking on: Oct 10, 2021 ?  ?Fish Oil 1000 MG Caps ?Take 1,000 mg by mouth daily. ?  ? ?  ? ? Follow-up Information   ? ? Charlynne Cousins, MD. Daphane Shepherd on 10/12/2021.   ?Specialty: Internal Medicine ?Why: '@1'$ :20pm ?Contact information: ?[address] ?[place] [address] ?[phone] ? ? ?  ?  ? ? [facility]. Schedule an appointment as soon as possible for a visit in 3 week(s).   ?Why: possible seizure ?Contact information: ?[place] [address] ?[phone] ? ?  ?  ? ?  ?  ? ?  ? ?Discharge Exam: ?Danley Danker Weights  ? 10/07/21 0235  ?Weight: 96.6 kg  ? ?Physical Exam ?HENT:  ?   Head: Normocephalic.  ?   Mouth/Throat:  ?   Pharynx: No oropharyngeal exudate.  ?Eyes:  ?   General: Lids are normal.  ?   Conjunctiva/sclera: Conjunctivae normal.  ?Cardiovascular:  ?   Rate and Rhythm: Normal rate and regular rhythm.  ?   Heart sounds: Normal heart sounds, S1 normal and S2 normal.  ?Pulmonary:  ?   Breath sounds: Examination of the right-lower field reveals decreased breath sounds. Examination of the left-lower field reveals decreased breath sounds. Decreased breath sounds present. No wheezing, rhonchi or rales.  ?Abdominal:  ?   Palpations: Abdomen is soft.  ?   Tenderness: There is no abdominal tenderness.  ?Musculoskeletal:  ?   Right lower leg: Swelling present.  ?   Left lower leg: Swelling present.  ?Skin: ?   General: Skin is warm.  ?  Findings: No rash.  ?Neurological:  ?   Mental Status: He is alert and oriented to person, place, and time.  ?  ? ?Condition at discharge: stable ? ?The results of significant diagnostics from this hospitalization (including imaging, microbiology, ancillary and laboratory) are listed below for reference.  ? ?Imaging Studies: ?CT Angio Chest PE W/Cm &/Or Wo Cm ? ?Result Date: 10/07/2021 ?CLINICAL DATA:  Seizure-like activity. EXAM: CT ANGIOGRAPHY CHEST WITH CONTRAST TECHNIQUE: Multidetector CT imaging of the chest was performed using the standard protocol during bolus administration of intravenous contrast. Multiplanar CT image reconstructions and MIPs were obtained to evaluate the vascular anatomy. RADIATION DOSE REDUCTION: This exam was performed according to the departmental dose-optimization program which includes automated exposure control, adjustment of the mA and/or kV according to patient size and/or use of iterative reconstruction technique. CONTRAST:  72m  OMNIPAQUE IOHEXOL 350 MG/ML SOLN COMPARISON:  Chest radiograph from earlier today FINDINGS: Cardiovascular: Satisfactory opacification of the pulmonary arteries to the segmental level. No evidence of pulmonar

## 2021-10-10 ENCOUNTER — Telehealth: Payer: Self-pay | Admitting: *Deleted

## 2021-10-10 NOTE — Telephone Encounter (Signed)
Transition Care Management Follow-up Telephone Call ?Date of discharge and from where: [facility]  ?How have you been since you were released from the hospital?  Feeling good ?Any questions or concerns? No ? ?Items Reviewed: ?Did the pt receive and understand the discharge instructions provided? Yes  ?Medications obtained and verified? Yes  ?Other? No  ?Any new allergies since your discharge? No  ?Dietary orders reviewed? No ?Do you have support at home? Yes  ? ?Home Care and Equipment/Supplies: ?Were home health services ordered? not applicable ?If so, what is the name of the agency?   ?Has the agency set up a time to come to the patient's home? not applicable ?Were any new equipment or medical supplies ordered?  No ?What is the name of the medical supply agency?  ?Were you able to get the supplies/equipment? not applicable ?Do you have any questions related to the use of the equipment or supplies? No ? ?Functional Questionnaire: (I = Independent and D = Dependent) ?ADLs: i ? ?Bathing/Dressing- i ? ?Meal Prep- i ? ?Eating- i ? ?Maintaining continence- i ? ?Transferring/Ambulation- i ? ?Managing Meds- i ? ?Follow up appointments reviewed: ? ?PCP Hospital f/u appt confirmed? Yes  Scheduled to see 10-12-2021  1:20  ?Specialist Hospital f/u appt confirmed? no ?Are transportation arrangements needed? No  ?If their condition worsens, is the pt aware to call PCP or go to the Emergency Dept.? Yes ?Was the patient provided with contact information for the PCP's office or ED? Yes ?Was to pt encouraged to call back with questions or concerns? Yes  ?

## 2021-10-12 ENCOUNTER — Ambulatory Visit
Admission: RE | Admit: 2021-10-12 | Discharge: 2021-10-12 | Disposition: A | Discharge status: Home / Self Care | Payer: Medicare PPO | Attending: Internal Medicine | Admitting: Internal Medicine

## 2021-10-12 ENCOUNTER — Encounter: Payer: Self-pay | Admitting: Internal Medicine

## 2021-10-12 ENCOUNTER — Ambulatory Visit (INDEPENDENT_AMBULATORY_CARE_PROVIDER_SITE_OTHER): Payer: Medicare PPO | Admitting: Internal Medicine

## 2021-10-12 ENCOUNTER — Ambulatory Visit
Admission: RE | Admit: 2021-10-12 | Discharge: 2021-10-12 | Disposition: A | Discharge status: Home / Self Care | Payer: Medicare PPO | Source: Ambulatory Visit | Attending: Internal Medicine | Admitting: Internal Medicine

## 2021-10-12 VITALS — BP 122/76 | HR 60 | Temp 98.4°F | Ht 70.98 in | Wt 214.0 lb

## 2021-10-12 DIAGNOSIS — J189 Pneumonia, unspecified organism: Secondary | ICD-10-CM | POA: Insufficient documentation

## 2021-10-12 DIAGNOSIS — G40909 Epilepsy, unspecified, not intractable, without status epilepticus: Secondary | ICD-10-CM | POA: Insufficient documentation

## 2021-10-12 DIAGNOSIS — R059 Cough, unspecified: Secondary | ICD-10-CM | POA: Diagnosis not present

## 2021-10-12 HISTORY — DX: Epilepsy, unspecified, not intractable, without status epilepticus: G40.909

## 2021-10-12 LAB — URINALYSIS, ROUTINE W REFLEX MICROSCOPIC
Bilirubin, UA: NEGATIVE
Glucose, UA: NEGATIVE
Leukocytes,UA: NEGATIVE
Nitrite, UA: NEGATIVE
RBC, UA: NEGATIVE
Specific Gravity, UA: 1.025 (ref 1.005–1.030)
Urobilinogen, Ur: 2 mg/dL — ABNORMAL HIGH (ref 0.2–1.0)
pH, UA: 7 (ref 5.0–7.5)

## 2021-10-12 LAB — CULTURE, BLOOD (SINGLE)
Culture: NO GROWTH
Special Requests: ADEQUATE

## 2021-10-12 LAB — MICROSCOPIC EXAMINATION
Bacteria, UA: NONE SEEN
Epithelial Cells (non renal): NONE SEEN /hpf (ref 0–10)
RBC, Urine: NONE SEEN /hpf (ref 0–2)
WBC, UA: NONE SEEN /hpf (ref 0–5)

## 2021-10-12 MED ORDER — FEXOFENADINE HCL 180 MG PO TABS: 180.0000 mg | ORAL_TABLET | Freq: Every day | ORAL | 1 refills | Status: DC

## 2021-10-12 MED ORDER — BENZONATATE 100 MG PO CAPS: 100.0000 mg | ORAL_CAPSULE | Freq: Two times a day (BID) | ORAL | 0 refills | Status: DC | PRN

## 2021-10-12 MED ORDER — EMPAGLIFLOZIN 10 MG PO TABS
10.0000 mg | ORAL_TABLET | Freq: Every day | ORAL | 1 refills | Status: DC
Start: 2021-10-12 — End: 2022-04-16

## 2021-10-12 NOTE — Progress Notes (Signed)
Let him stay home till Monday - needs to recheck cxr x next week please lets order one for next week and have him come in for a fu virtually thnx phone visit. Pl have him comeback to pick up a letter for work, they done have mychart access thnx

## 2021-10-12 NOTE — Progress Notes (Signed)
BP 122/76   Pulse 60   Temp 98.4 F (36.9 C) (Oral)   Ht 5' 10.98" (1.803 m)   Wt 214 lb (97.1 kg)   SpO2 95%   BMI 29.86 kg/m    Subjective:    Patient ID: Donald Le, male    DOB: [birth year], 71 y.o.   MRN: [id number]  Chief Complaint  Patient presents with   Hospitalization Follow-up    Sepsis/ acute organ / Pneumonia Patient states that is feeling much better than he did.     HPI: Donald Le is a 71 y.o. male  Pt is here for a hopsital folow up, went to the ER for abdominal fullness, had a witnessed seizure  neever seen neurology  Had a similar episode last year.  Pt was diagnosed with a pneumonia as wlel at this hospitlaization.    Chief Complaint  Patient presents with   Hospitalization Follow-up    Sepsis/ acute organ / Pneumonia Patient states that is feeling much better than he did.     Relevant past medical, surgical, family and social history reviewed and updated as indicated. Interim medical history since our last visit reviewed. Allergies and medications reviewed and updated.  Review of Systems  Per HPI unless specifically indicated above     Objective:    BP 122/76   Pulse 60   Temp 98.4 F (36.9 C) (Oral)   Ht 5' 10.98" (1.803 m)   Wt 214 lb (97.1 kg)   SpO2 95%   BMI 29.86 kg/m   Wt Readings from Last 3 Encounters:  10/12/21 214 lb (97.1 kg)  10/07/21 213 lb (96.6 kg)  10/04/21 213 lb (96.6 kg)    Physical Exam  Results for orders placed or performed during the hospital encounter of 10/07/21  Blood culture (routine single)   Specimen: BLOOD  Result Value Ref Range   Specimen Description BLOOD BLOOD RIGHT FOREARM    Special Requests      BOTTLES DRAWN AEROBIC AND ANAEROBIC Blood Culture adequate volume   Culture      NO GROWTH 5 DAYS Performed at [facility], [address], [place], [place] [address]    Report Status 10/12/2021 FINAL   Urine Culture   Specimen: In/Out Cath Urine  Result Value  Ref Range   Specimen Description      IN/OUT CATH URINE Performed at [facility], [address] [place], [place] [address]    Special Requests      NONE Performed at [facility], [address], [place], [place] [address]    Culture      NO GROWTH Performed at [facility], [place] [address] [place], [place] [address]    Report Status 10/09/2021 FINAL   Resp Panel by RT-PCR (Flu A&B, Covid) Nasopharyngeal Swab   Specimen: Nasopharyngeal Swab; Nasopharyngeal(NP) swabs in vial transport medium  Result Value Ref Range   SARS Coronavirus 2 by RT PCR NEGATIVE NEGATIVE   Influenza A by PCR NEGATIVE NEGATIVE   Influenza B by PCR NEGATIVE NEGATIVE  Culture, blood (single) w Reflex to ID Panel   Specimen: BLOOD  Result Value Ref Range   Specimen Description BLOOD RIGHT ANTECUBITAL    Special Requests      BOTTLES DRAWN AEROBIC AND ANAEROBIC Blood Culture adequate volume   Culture      NO GROWTH 4 DAYS Performed at [facility], [address], [place], [place] [address]    Report  Status PENDING   Lactic acid, plasma  Result Value Ref Range   Lactic Acid, Venous 2.2 (HH) 0.5 - 1.9 mmol/L  Comprehensive metabolic panel  Result Value Ref Range   Sodium 139 135 - 145 mmol/L   Potassium 4.0 3.5 - 5.1 mmol/L   Chloride 104 98 - 111 mmol/L   CO2 28 22 - 32 mmol/L   Glucose, Bld 199 (H) 70 - 99 mg/dL   BUN 28 (H) 8 - 23 mg/dL   Creatinine, Ser 1.54 (H) 0.61 - 1.24 mg/dL   Calcium 9.1 8.9 - 10.3 mg/dL   Total Protein 7.8 6.5 - 8.1 g/dL   Albumin 4.2 3.5 - 5.0 g/dL   AST 28 15 - 41 U/L   ALT 23 0 - 44 U/L   Alkaline Phosphatase 53 38 - 126 U/L   Total Bilirubin 0.9 0.3 - 1.2 mg/dL   GFR, Estimated 49 (L) >60 mL/min   Anion gap 7 5 - 15  CBC with Differential  Result Value Ref Range   WBC 5.7 4.0 - 10.5 K/uL   RBC 6.36 (H) 4.22 - 5.81 MIL/uL   Hemoglobin 17.1 (H) 13.0 - 17.0 g/dL   HCT 51.9 39.0 - 52.0 %   MCV 81.6 80.0 - 100.0 fL   MCH  26.9 26.0 - 34.0 pg   MCHC 32.9 30.0 - 36.0 g/dL   RDW 15.3 11.5 - 15.5 %   Platelets 127 (L) 150 - 400 K/uL   nRBC 0.0 0.0 - 0.2 %   Neutrophils Relative % 87 %   Neutro Abs 5.0 1.7 - 7.7 K/uL   Lymphocytes Relative 8 %   Lymphs Abs 0.4 (L) 0.7 - 4.0 K/uL   Monocytes Relative 4 %   Monocytes Absolute 0.2 0.1 - 1.0 K/uL   Eosinophils Relative 1 %   Eosinophils Absolute 0.0 0.0 - 0.5 K/uL   Basophils Relative 0 %   Basophils Absolute 0.0 0.0 - 0.1 K/uL   Immature Granulocytes 0 %   Abs Immature Granulocytes 0.02 0.00 - 0.07 K/uL  Protime-INR  Result Value Ref Range   Prothrombin Time 13.6 11.4 - 15.2 seconds   INR 1.0 0.8 - 1.2  APTT  Result Value Ref Range   aPTT 20 (L) 24 - 36 seconds  Urinalysis, Complete w Microscopic  Result Value Ref Range   Color, Urine YELLOW (A) YELLOW   APPearance CLEAR (A) CLEAR   Specific Gravity, Urine 1.043 (H) 1.005 - 1.030   pH 5.0 5.0 - 8.0   Glucose, UA NEGATIVE NEGATIVE mg/dL   Hgb urine dipstick NEGATIVE NEGATIVE   Bilirubin Urine NEGATIVE NEGATIVE   Ketones, ur NEGATIVE NEGATIVE mg/dL   Protein, ur NEGATIVE NEGATIVE mg/dL   Nitrite NEGATIVE NEGATIVE   Leukocytes,Ua NEGATIVE NEGATIVE   RBC / HPF 0-5 0 - 5 RBC/hpf   WBC, UA 0-5 0 - 5 WBC/hpf   Bacteria, UA NONE SEEN NONE SEEN   Squamous Epithelial / LPF NONE SEEN 0 - 5   Mucus PRESENT   Blood gas, venous  Result Value Ref Range   pH, Ven 7.29 7.25 - 7.43   pCO2, Ven 47 44 - 60 mmHg   pO2, Ven 33 32 - 45 mmHg   Bicarbonate 22.6 20.0 - 28.0 mmol/L   Acid-base deficit 4.2 (H) 0.0 - 2.0 mmol/L   O2 Saturation 42.1 %   Patient temperature 37.0    Collection site VEIN    Drawn by VENOUS   Lactic acid,  plasma  Result Value Ref Range   Lactic Acid, Venous 2.7 (HH) 0.5 - 1.9 mmol/L  HIV Antibody (routine testing w rflx)  Result Value Ref Range   HIV Screen 4th Generation wRfx Non Reactive Non Reactive  Procalcitonin  Result Value Ref Range   Procalcitonin 11.27 ng/mL  Lactic  acid, plasma  Result Value Ref Range   Lactic Acid, Venous 1.6 0.5 - 1.9 mmol/L  Lactic acid, plasma  Result Value Ref Range   Lactic Acid, Venous 1.6 0.5 - 1.9 mmol/L  Cortisol-am, blood  Result Value Ref Range   Cortisol - AM 22.7 (H) 6.7 - 22.6 ug/dL  Hemoglobin A1c  Result Value Ref Range   Hgb A1c MFr Bld 6.9 (H) 4.8 - 5.6 %   Mean Plasma Glucose 151.33 mg/dL  Glucose, capillary  Result Value Ref Range   Glucose-Capillary 132 (H) 70 - 99 mg/dL  Glucose, capillary  Result Value Ref Range   Glucose-Capillary 126 (H) 70 - 99 mg/dL  Glucose, capillary  Result Value Ref Range   Glucose-Capillary 121 (H) 70 - 99 mg/dL   Comment 1 Notify RN    Comment 2 Document in Chart   Glucose, capillary  Result Value Ref Range   Glucose-Capillary 117 (H) 70 - 99 mg/dL  CBC  Result Value Ref Range   WBC 8.6 4.0 - 10.5 K/uL   RBC 4.84 4.22 - 5.81 MIL/uL   Hemoglobin 12.9 (L) 13.0 - 17.0 g/dL   HCT 38.7 (L) 39.0 - 52.0 %   MCV 80.0 80.0 - 100.0 fL   MCH 26.7 26.0 - 34.0 pg   MCHC 33.3 30.0 - 36.0 g/dL   RDW 14.4 11.5 - 15.5 %   Platelets 124 (L) 150 - 400 K/uL   nRBC 0.0 0.0 - 0.2 %  Basic metabolic panel  Result Value Ref Range   Sodium 141 135 - 145 mmol/L   Potassium 3.4 (L) 3.5 - 5.1 mmol/L   Chloride 106 98 - 111 mmol/L   CO2 27 22 - 32 mmol/L   Glucose, Bld 123 (H) 70 - 99 mg/dL   BUN 16 8 - 23 mg/dL   Creatinine, Ser 1.32 (H) 0.61 - 1.24 mg/dL   Calcium 8.0 (L) 8.9 - 10.3 mg/dL   GFR, Estimated 58 (L) >60 mL/min   Anion gap 8 5 - 15  Glucose, capillary  Result Value Ref Range   Glucose-Capillary 135 (H) 70 - 99 mg/dL  Glucose, capillary  Result Value Ref Range   Glucose-Capillary 130 (H) 70 - 99 mg/dL  Magnesium  Result Value Ref Range   Magnesium 1.9 1.7 - 2.4 mg/dL  CBG monitoring, ED  Result Value Ref Range   Glucose-Capillary 171 (H) 70 - 99 mg/dL  Troponin I (High Sensitivity)  Result Value Ref Range   Troponin I (High Sensitivity) 4 <18 ng/L  Troponin I  (High Sensitivity)  Result Value Ref Range   Troponin I (High Sensitivity) 3 <18 ng/L        Current Outpatient Medications:    amoxicillin-clavulanate (AUGMENTIN) 875-125 MG tablet, Take 1 tablet by mouth 2 (two) times daily for 3 days., Disp: 6 tablet, Rfl: 0   aspirin EC 81 MG tablet, Take 81 mg by mouth daily., Disp: , Rfl:    atorvastatin (LIPITOR) 40 MG tablet, TAKE 0.5 TABLETS (20 MG TOTAL) BY MOUTH DAILY. 1/2 TABLET DAILY, Disp: 45 tablet, Rfl: 0   azithromycin (ZITHROMAX) 250 MG tablet, Take 1 tablet (250 mg total)  by mouth daily for 2 days., Disp: 2 tablet, Rfl: 0   benzonatate (TESSALON) 100 MG capsule, Take 1 capsule (100 mg total) by mouth 2 (two) times daily as needed for cough., Disp: 20 capsule, Rfl: 0   empagliflozin (JARDIANCE) 10 MG TABS tablet, Take 1 tablet (10 mg total) by mouth daily., Disp: 30 tablet, Rfl: 1   fexofenadine (ALLEGRA ALLERGY) 180 MG tablet, Take 1 tablet (180 mg total) by mouth daily., Disp: 10 tablet, Rfl: 1   Omega-3 Fatty Acids (FISH OIL) 1000 MG CAPS, Take 1,000 mg by mouth daily., Disp: , Rfl:     Assessment & Plan:  Seizure recurrent. Will need to fu with  EEG was negative No driving until cleared by neurology   2. Adrenal adenoma, right 2.6 cm right adrenal nodule consistent with adenoma. Will need follow-up as outpatient to see if this is growing in size or active.  3. DM : a1c 6.9 , declines metformin  check HbA1c,  urine  microalbumin  diabetic diet plan given to pt  adviced regarding hypoglycemia and instructions given to pt today on how to prevent and treat the same if it were to occur. pt acknowledges the plan and voices understanding of the same.  exercise plan given and encouraged.   advice diabetic yearly podiatry, ophthalmology , nutritionist , dental check q 6 months,  Problem List Items Addressed This Visit   None Visit Diagnoses     Seizure disorder ([place])    -  Primary   Relevant Orders   Ambulatory referral to Neurology    CBC with Differential/Platelet   Comprehensive metabolic panel   TSH   Urinalysis, Routine w reflex microscopic   Pneumonia of both lungs due to infectious organism, unspecified part of lung       Relevant Medications   benzonatate (TESSALON) 100 MG capsule   fexofenadine (ALLEGRA ALLERGY) 180 MG tablet   Other Relevant Orders   DG Chest 2 View        Orders Placed This Encounter  Procedures   DG Chest 2 View   CBC with Differential/Platelet   Comprehensive metabolic panel   TSH   Urinalysis, Routine w reflex microscopic   Ambulatory referral to Neurology     Meds ordered this encounter  Medications   empagliflozin (JARDIANCE) 10 MG TABS tablet    Sig: Take 1 tablet (10 mg total) by mouth daily.    Dispense:  30 tablet    Refill:  1   benzonatate (TESSALON) 100 MG capsule    Sig: Take 1 capsule (100 mg total) by mouth 2 (two) times daily as needed for cough.    Dispense:  20 capsule    Refill:  0   fexofenadine (ALLEGRA ALLERGY) 180 MG tablet    Sig: Take 1 tablet (180 mg total) by mouth daily.    Dispense:  10 tablet    Refill:  1     Follow up plan: No follow-ups on file.

## 2021-10-13 ENCOUNTER — Telehealth: Payer: Self-pay | Admitting: Internal Medicine

## 2021-10-13 DIAGNOSIS — J189 Pneumonia, unspecified organism: Secondary | ICD-10-CM

## 2021-10-13 LAB — CBC WITH DIFFERENTIAL/PLATELET
Basophils Absolute: 0.1 10*3/uL (ref 0.0–0.2)
Basos: 1 %
EOS (ABSOLUTE): 0.2 10*3/uL (ref 0.0–0.4)
Eos: 3 %
Hematocrit: 44.7 % (ref 37.5–51.0)
Hemoglobin: 14.6 g/dL (ref 13.0–17.7)
Immature Grans (Abs): 0.1 10*3/uL (ref 0.0–0.1)
Immature Granulocytes: 2 %
Lymphocytes Absolute: 1.7 10*3/uL (ref 0.7–3.1)
Lymphs: 20 %
MCH: 26.6 pg (ref 26.6–33.0)
MCHC: 32.7 g/dL (ref 31.5–35.7)
MCV: 81 fL (ref 79–97)
Monocytes Absolute: 0.9 10*3/uL (ref 0.1–0.9)
Monocytes: 11 %
Neutrophils Absolute: 5.3 10*3/uL (ref 1.4–7.0)
Neutrophils: 63 %
Platelets: 216 10*3/uL (ref 150–450)
RBC: 5.49 x10E6/uL (ref 4.14–5.80)
RDW: 15 % (ref 11.6–15.4)
WBC: 8.3 10*3/uL (ref 3.4–10.8)

## 2021-10-13 LAB — COMPREHENSIVE METABOLIC PANEL
ALT: 30 IU/L (ref 0–44)
AST: 20 IU/L (ref 0–40)
Albumin/Globulin Ratio: 1.2 (ref 1.2–2.2)
Albumin: 3.8 g/dL (ref 3.8–4.8)
Alkaline Phosphatase: 49 IU/L (ref 44–121)
BUN/Creatinine Ratio: 11 (ref 10–24)
BUN: 13 mg/dL (ref 8–27)
Bilirubin Total: 0.3 mg/dL (ref 0.0–1.2)
CO2: 21 mmol/L (ref 20–29)
Calcium: 9 mg/dL (ref 8.6–10.2)
Chloride: 106 mmol/L (ref 96–106)
Creatinine, Ser: 1.14 mg/dL (ref 0.76–1.27)
Globulin, Total: 3.2 g/dL (ref 1.5–4.5)
Glucose: 93 mg/dL (ref 70–99)
Potassium: 4.2 mmol/L (ref 3.5–5.2)
Sodium: 141 mmol/L (ref 134–144)
Total Protein: 7 g/dL (ref 6.0–8.5)
eGFR: 70 mL/min/{1.73_m2} (ref >59)

## 2021-10-13 LAB — CULTURE, BLOOD (SINGLE)
Culture: NO GROWTH
Special Requests: ADEQUATE

## 2021-10-13 LAB — TSH: TSH: 13.5 u[IU]/mL — ABNORMAL HIGH (ref 0.450–4.500)

## 2021-10-13 NOTE — Telephone Encounter (Signed)
Medication Refill - Medication:  empagliflozin (JARDIANCE) 10 MG TABS tablet   Has the patient contacted their pharmacy? Yes.   Contact PCP *Pt states that this medication is to expensive and wanted to try something different, pt stated he looked up a different medication that he saw called glimepirde , and if cheaper if he could get it, if that was approved by PCP*  Preferred Pharmacy (with phone number or street name):  [id number] [address] [place] [place] Phone:  [phone] Fax:  [phone]  Has the patient been seen for an appointment in the last year OR does the patient have an upcoming appointment? Yes.    Agent: Please be advised that RX refills may take up to 3 business days. We ask that you follow-up with your pharmacy.

## 2021-10-13 NOTE — Telephone Encounter (Signed)
Copied from [place] [id number]. Topic: Appointment Scheduling - Scheduling Inquiry for Clinic >> Oct 13, 2021 10:04 AM Erick Blinks wrote: Reason for CRM: Pt called seeking status update on chest xray orders, current status says pending. He also wants a doctor's note to take to work for his recent visits. Wants it uploaded via [place] contact: [phone]   Requesting call back

## 2021-10-17 NOTE — Telephone Encounter (Signed)
Returned patients call, and informed him that his xray order is currently in and he just has to go and have it done.

## 2021-10-17 NOTE — Telephone Encounter (Signed)
Pt called back , checking status of xray order. Please call back.

## 2021-10-17 NOTE — Telephone Encounter (Signed)
Pt would like a call back to verify his appointment on Thursday.  Pt states he has missed 2 calls from the office today. His appointment says Mychart Video Visit, pt states he knows nothing about a video visit.  Please call when you return.

## 2021-10-17 NOTE — Telephone Encounter (Signed)
CXR has been ordered and placed when he was here last pl see if he needs another order. Thnx.

## 2021-10-18 ENCOUNTER — Ambulatory Visit
Admission: RE | Admit: 2021-10-18 | Discharge: 2021-10-18 | Disposition: A | Discharge status: Home / Self Care | Payer: Medicare PPO | Attending: Internal Medicine | Admitting: Internal Medicine

## 2021-10-18 ENCOUNTER — Ambulatory Visit: Payer: Medicare PPO

## 2021-10-18 ENCOUNTER — Ambulatory Visit
Admission: RE | Admit: 2021-10-18 | Discharge: 2021-10-18 | Disposition: A | Discharge status: Home / Self Care | Payer: Medicare PPO | Source: Ambulatory Visit | Attending: Internal Medicine | Admitting: Internal Medicine

## 2021-10-18 DIAGNOSIS — J189 Pneumonia, unspecified organism: Secondary | ICD-10-CM

## 2021-10-18 NOTE — Telephone Encounter (Signed)
Patient would like to get a different medication for his DM patient states that the Vania Rea is too expensive and can not afford to get it.  ??  Contact PCP *Pt states that this medication is to expensive and wanted to try something different, pt stated he looked up a different medication that he saw called glimepirde , and if cheaper if he could get it, if that was approved by PCP*

## 2021-10-18 NOTE — Telephone Encounter (Signed)
Pt let pt know he needs to  start metformin 500 mg once dialy sugars arent very high but needs one med. Pl let him know. This is safer than glipizide and $4 generic

## 2021-10-18 NOTE — Telephone Encounter (Signed)
Patient will discuss with PCP at virtual visit

## 2021-10-19 ENCOUNTER — Telehealth: Payer: Medicare PPO | Admitting: Internal Medicine

## 2021-10-19 ENCOUNTER — Telehealth (INDEPENDENT_AMBULATORY_CARE_PROVIDER_SITE_OTHER): Payer: Medicare PPO | Admitting: Internal Medicine

## 2021-10-19 ENCOUNTER — Encounter: Payer: Self-pay | Admitting: Internal Medicine

## 2021-10-19 DIAGNOSIS — E785 Hyperlipidemia, unspecified: Secondary | ICD-10-CM | POA: Diagnosis not present

## 2021-10-19 DIAGNOSIS — J189 Pneumonia, unspecified organism: Secondary | ICD-10-CM | POA: Diagnosis not present

## 2021-10-19 DIAGNOSIS — E1169 Type 2 diabetes mellitus with other specified complication: Secondary | ICD-10-CM

## 2021-10-19 MED ORDER — METFORMIN HCL 500 MG PO TABS: 500.0000 mg | ORAL_TABLET | Freq: Two times a day (BID) | ORAL | 2 refills | Status: DC

## 2021-10-19 MED ORDER — METFORMIN HCL 500 MG PO TABS: 500.0000 mg | ORAL_TABLET | Freq: Every day | ORAL | 2 refills | Status: DC

## 2021-10-19 NOTE — Progress Notes (Signed)
There were no vitals taken for this visit.   Subjective:    Patient ID: Donald Le, male    DOB: 11-21-1950, 71 y.o.   MRN: [id number]  Chief Complaint  Patient presents with  . Pneumonia    F/u chest xray for pneumonia,    HPI: Donald Le is a 71 y.o. male   This visit was completed via telephone due to the restrictions of the COVID-19 pandemic. All issues as above were discussed and addressed but no physical exam was performed. If it was felt that the patient should be evaluated in the office, they were directed there. The patient verbally consented to this visit. Patient was unable to complete an audio/visual visit due to Technical difficulties. Due to the catastrophic nature of the COVID-19 pandemic, this visit was done through audio contact only. Location of the patient: home Location of the provider: work Those involved with this call:  Provider: Charlynne Cousins, MD CMA: Frazier Butt, [place] Desk/Registration: [employer]  Time spent on call: 10 minutes on the phone discussing health concerns. 10 minutes total spent in review of patient's record and preparation of their chart.  Feels better, throat is better, no cough, has been outside    Pneumonia There is no chest tightness, cough, difficulty breathing, frequent throat clearing, hemoptysis, hoarse voice, shortness of breath, sputum production or wheezing. Primary symptoms comments: CXR shows no acute findings. . Pertinent negatives include no chest pain or weight loss.  Diabetes He presents for his follow-up (jardiance's copay was too high pt didnt start using suhc, a1c at 6.9 , declined metfomrin at last visit, willing to try it today) diabetic visit. He has type 2 diabetes mellitus. Pertinent negatives for diabetes include no blurred vision, no chest pain, no fatigue, no foot paresthesias, no foot ulcerations, no polydipsia, no polyphagia, no polyuria, no visual change, no weakness and no weight loss.     Chief Complaint  Patient presents with  . Pneumonia    F/u chest xray for pneumonia,    Relevant past medical, surgical, family and social history reviewed and updated as indicated. Interim medical history since our last visit reviewed. Allergies and medications reviewed and updated.  Review of Systems  Constitutional:  Negative for fatigue and weight loss.  HENT:  Negative for hoarse voice.   Eyes:  Negative for blurred vision.  Respiratory:  Negative for cough, hemoptysis, sputum production, shortness of breath and wheezing.   Cardiovascular:  Negative for chest pain.  Endocrine: Negative for polydipsia, polyphagia and polyuria.  Neurological:  Negative for weakness.   Per HPI unless specifically indicated above     Objective:    There were no vitals taken for this visit.  Wt Readings from Last 3 Encounters:  10/12/21 214 lb (97.1 kg)  10/07/21 213 lb (96.6 kg)  10/04/21 213 lb (96.6 kg)    Physical Exam  Unable to peform sec to virtual visit.   Results for orders placed or performed in visit on 10/12/21  Microscopic Examination   Urine  Result Value Ref Range   WBC, UA None seen 0 - 5 /hpf   RBC None seen 0 - 2 /hpf   Epithelial Cells (non renal) None seen 0 - 10 /hpf   Mucus, UA Present (A) Not Estab.   Bacteria, UA None seen None seen/Few  CBC with Differential/Platelet  Result Value Ref Range   WBC 8.3 3.4 - 10.8 x10E3/uL   RBC 5.49 4.14 - 5.80 x10E6/uL  Hemoglobin 14.6 13.0 - 17.7 g/dL   Hematocrit 44.7 37.5 - 51.0 %   MCV 81 79 - 97 fL   MCH 26.6 26.6 - 33.0 pg   MCHC 32.7 31.5 - 35.7 g/dL   RDW 15.0 11.6 - 15.4 %   Platelets 216 150 - 450 x10E3/uL   Neutrophils 63 Not Estab. %   Lymphs 20 Not Estab. %   Monocytes 11 Not Estab. %   Eos 3 Not Estab. %   Basos 1 Not Estab. %   Neutrophils Absolute 5.3 1.4 - 7.0 x10E3/uL   Lymphocytes Absolute 1.7 0.7 - 3.1 x10E3/uL   Monocytes Absolute 0.9 0.1 - 0.9 x10E3/uL   EOS (ABSOLUTE) 0.2 0.0 - 0.4  x10E3/uL   Basophils Absolute 0.1 0.0 - 0.2 x10E3/uL   Immature Granulocytes 2 Not Estab. %   Immature Grans (Abs) 0.1 0.0 - 0.1 x10E3/uL  Comprehensive metabolic panel  Result Value Ref Range   Glucose 93 70 - 99 mg/dL   BUN 13 8 - 27 mg/dL   Creatinine, Ser 1.14 0.76 - 1.27 mg/dL   eGFR 70 >59 mL/min/1.73   BUN/Creatinine Ratio 11 10 - 24   Sodium 141 134 - 144 mmol/L   Potassium 4.2 3.5 - 5.2 mmol/L   Chloride 106 96 - 106 mmol/L   CO2 21 20 - 29 mmol/L   Calcium 9.0 8.6 - 10.2 mg/dL   Total Protein 7.0 6.0 - 8.5 g/dL   Albumin 3.8 3.8 - 4.8 g/dL   Globulin, Total 3.2 1.5 - 4.5 g/dL   Albumin/Globulin Ratio 1.2 1.2 - 2.2   Bilirubin Total 0.3 0.0 - 1.2 mg/dL   Alkaline Phosphatase 49 44 - 121 IU/L   AST 20 0 - 40 IU/L   ALT 30 0 - 44 IU/L  TSH  Result Value Ref Range   TSH 13.500 (H) 0.450 - 4.500 uIU/mL  Urinalysis, Routine w reflex microscopic  Result Value Ref Range   Specific Gravity, UA 1.025 1.005 - 1.030   pH, UA 7.0 5.0 - 7.5   Color, UA Yellow Yellow   Appearance Ur Clear Clear   Leukocytes,UA Negative Negative   Protein,UA 1+ (A) Negative/Trace   Glucose, UA Negative Negative   Ketones, UA Trace (A) Negative   RBC, UA Negative Negative   Bilirubin, UA Negative Negative   Urobilinogen, Ur 2.0 (H) 0.2 - 1.0 mg/dL   Nitrite, UA Negative Negative   Microscopic Examination See below:         Current Outpatient Medications:  .  aspirin EC 81 MG tablet, Take 81 mg by mouth daily., Disp: , Rfl:  .  atorvastatin (LIPITOR) 40 MG tablet, TAKE 0.5 TABLETS (20 MG TOTAL) BY MOUTH DAILY. 1/2 TABLET DAILY, Disp: 45 tablet, Rfl: 0 .  benzonatate (TESSALON) 100 MG capsule, Take 1 capsule (100 mg total) by mouth 2 (two) times daily as needed for cough., Disp: 20 capsule, Rfl: 0 .  empagliflozin (JARDIANCE) 10 MG TABS tablet, Take 1 tablet (10 mg total) by mouth daily., Disp: 30 tablet, Rfl: 1 .  fexofenadine (ALLEGRA ALLERGY) 180 MG tablet, Take 1 tablet (180 mg total) by  mouth daily., Disp: 10 tablet, Rfl: 1 .  metFORMIN (GLUCOPHAGE) 500 MG tablet, Take 1 tablet (500 mg total) by mouth daily with breakfast., Disp: 30 tablet, Rfl: 2 .  Omega-3 Fatty Acids (FISH OIL) 1000 MG CAPS, Take 1,000 mg by mouth daily., Disp: , Rfl:     Assessment & Plan:  DM  Pt willing to try metforming to start at 250 mg q am  x 1 week then increase to 500 mg daily for such A1c at 6/9  Rechcking in July . diabetic diet plan given to pt  adviced regarding hypoglycemia and instructions given to pt today on how to prevent and treat the same if it were to occur. pt acknowledges the plan and voices understanding of the same.  exercise plan given and encouraged.     Pneumonia improved on CXR as below.  S/p abx, feels much improved.  Ok to RTW   CXR  5/18  IMPRESSION: No significant change in diffuse airspace and interstitial opacities throughout both lungs compared with recent prior studies, likely reflecting multilobar pneumonia or atypical inflammatory process. Continued follow-up recommended.   5/24 CXR  The heart size and mediastinal contours are within normal limits. 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 No acute airspace opacity. The visualized skeletal structures are unremarkable.   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 No acute abnormality of the lungs.    Problem List Items Addressed This Visit       Respiratory   Pneumonia of both lungs due to infectious organism     Endocrine   Type 2 diabetes mellitus with hyperlipidemia ([place]) - Primary   Relevant Medications   metFORMIN (GLUCOPHAGE) 500 MG tablet     No orders of the defined types were placed in this encounter.    Meds ordered this encounter  Medications  . DISCONTD: metFORMIN (GLUCOPHAGE) 500 MG tablet    Sig: Take 1 tablet (500 mg total) by mouth 2 (two) times daily with a meal.    Dispense:  30 tablet    Refill:  2  . metFORMIN  (GLUCOPHAGE) 500 MG tablet    Sig: Take 1 tablet (500 mg total) by mouth daily with breakfast.    Dispense:  30 tablet    Refill:  2     Follow up plan: Return in about 4 weeks (around 11/16/2021).

## 2021-10-26 ENCOUNTER — Other Ambulatory Visit: Payer: Self-pay | Admitting: Internal Medicine

## 2021-10-27 NOTE — Telephone Encounter (Signed)
Medication was refilled 10/19/21 by PCP. Will refuse duplicate request.  Requested Prescriptions  Pending Prescriptions Disp Refills  . metFORMIN (GLUCOPHAGE) 500 MG tablet [Pharmacy Med Name: METFORMIN HCL 500 MG TABLET] 30 tablet 2    Sig: TAKE 1 TABLET BY MOUTH 2 TIMES DAILY WITH A MEAL.     Endocrinology:  Diabetes - Biguanides Passed - 10/26/2021  9:31 AM      Passed - Cr in normal range and within 360 days    Creatinine, Ser  Date Value Ref Range Status  10/12/2021 1.14 0.76 - 1.27 mg/dL Final         Passed - HBA1C is between 0 and 7.9 and within 180 days    HB A1C (BAYER DCA - WAIVED)  Date Value Ref Range Status  09/12/2021 6.5 (H) 4.8 - 5.6 % Final    Comment:             Prediabetes: 5.7 - 6.4          Diabetes: >6.4          Glycemic control for adults with diabetes: <7.0    Hgb A1c MFr Bld  Date Value Ref Range Status  10/08/2021 6.9 (H) 4.8 - 5.6 % Final    Comment:    (NOTE) Pre diabetes:          5.7%-6.4%  Diabetes:              >6.4%  Glycemic control for   <7.0% adults with diabetes          Passed - eGFR in normal range and within 360 days    GFR calc Af Amer  Date Value Ref Range Status  06/27/2020 68 >59 mL/min/1.73 Final    Comment:    **In accordance with recommendations from the NKF-ASN Task force,**   [facility] is in the process of updating its eGFR calculation to the   2021 CKD-EPI creatinine equation that estimates kidney function   without a race variable.    GFR, Estimated  Date Value Ref Range Status  10/09/2021 58 (L) >60 mL/min Final    Comment:    (NOTE) Calculated using the CKD-EPI Creatinine Equation (2021)    eGFR  Date Value Ref Range Status  10/12/2021 70 >59 mL/min/1.73 Final         Passed - B12 Level in normal range and within 720 days    Vitamin B-12  Date Value Ref Range Status  09/12/2021 619 232 - 1,245 pg/mL Final         Passed - Valid encounter within last 6 months    Recent Outpatient Visits           1 week ago Type 2 diabetes mellitus with hyperlipidemia ([place])   [facility] Vigg, Avanti, MD   2 weeks ago Seizure disorder ([place])   [facility] Vigg, Avanti, MD   1 month ago Diabetes mellitus without complication ([place])   [facility] Vigg, Avanti, MD   1 year ago Routine general medical examination at a health care facility   [place], [place], DO   1 year ago Hypertension associated with diabetes [facility])   [place], Megan P, DO      Future Appointments            In 1 week Alric Ran, MD [facility]   In 2 weeks Vigg, Avanti, MD [facility]   In 3 weeks  [facility] [place]   In 1 month Kathrine Haddock, NP [facility]   In 11 months [name], MD [facility]           Passed - CBC within normal limits and completed in the last 12 months    WBC  Date Value Ref Range Status  10/12/2021 8.3 3.4 - 10.8 x10E3/uL Final  10/09/2021 8.6 4.0 - 10.5 K/uL Final   RBC  Date Value Ref Range Status  10/12/2021 5.49 4.14 - 5.80 x10E6/uL Final  10/09/2021 4.84 4.22 - 5.81 MIL/uL Final   Hemoglobin  Date Value Ref Range Status  10/12/2021 14.6 13.0 - 17.7 g/dL Final   Hematocrit  Date Value Ref Range Status  10/12/2021 44.7 37.5 - 51.0 % Final   MCHC  Date Value Ref Range Status  10/12/2021 32.7 31.5 - 35.7 g/dL Final  10/09/2021 33.3 30.0 - 36.0 g/dL Final   [facility]  Date Value Ref Range Status  10/12/2021 26.6 26.6 - 33.0 pg Final  10/09/2021 26.7 26.0 - 34.0 pg Final   MCV  Date Value Ref Range Status  10/12/2021 81 79 - 97 fL Final   No results found for: PLTCOUNTKUC, LABPLAT, POCPLA RDW  Date Value Ref Range Status  10/12/2021 15.0 11.6 - 15.4 % Final

## 2021-11-07 ENCOUNTER — Other Ambulatory Visit: Payer: Medicare PPO

## 2021-11-08 ENCOUNTER — Ambulatory Visit (INDEPENDENT_AMBULATORY_CARE_PROVIDER_SITE_OTHER): Payer: Medicare PPO | Admitting: Neurology

## 2021-11-08 ENCOUNTER — Encounter: Payer: Self-pay | Admitting: Neurology

## 2021-11-08 VITALS — BP 136/82 | HR 76 | Ht 70.0 in | Wt 210.0 lb

## 2021-11-08 DIAGNOSIS — R569 Unspecified convulsions: Secondary | ICD-10-CM | POA: Diagnosis not present

## 2021-11-08 NOTE — Patient Instructions (Signed)
Continue current medication 48-hour ambulatory EEG, I will contact you to go over the result Please contact me if you have another event concerning for seizure Follow-up in 6 months
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
## 2021-11-13 ENCOUNTER — Telehealth: Payer: Self-pay

## 2021-11-13 ENCOUNTER — Telehealth: Payer: Self-pay | Admitting: Neurology

## 2021-11-13 NOTE — Telephone Encounter (Signed)
Video EEG order has been faxed to [facility].  Fax # [phone], confirmation received.

## 2021-11-13 NOTE — Telephone Encounter (Signed)
Donald Le Auth: [id number] exp. 11/13/21-12/13/21 sent to GI

## 2021-11-14 ENCOUNTER — Ambulatory Visit: Payer: Medicare PPO | Admitting: Internal Medicine

## 2021-11-17 ENCOUNTER — Ambulatory Visit
Admission: RE | Admit: 2021-11-17 | Discharge: 2021-11-17 | Disposition: A | Discharge status: Home / Self Care | Payer: Medicare PPO | Source: Ambulatory Visit | Attending: Neurology | Admitting: Neurology

## 2021-11-17 DIAGNOSIS — R569 Unspecified convulsions: Secondary | ICD-10-CM | POA: Diagnosis not present

## 2021-11-17 MED ORDER — GADOBENATE DIMEGLUMINE 529 MG/ML IV SOLN
20.0000 mL | Freq: Once | INTRAVENOUS | Status: AC | PRN
  Administered 2021-11-17: 20 mL via INTRAVENOUS

## 2021-11-21 ENCOUNTER — Telehealth: Payer: Self-pay | Admitting: Neurology

## 2021-11-21 ENCOUNTER — Ambulatory Visit: Payer: Medicare PPO | Admitting: Urology

## 2021-11-21 NOTE — Telephone Encounter (Signed)
Pt would like a call re: when he will be able to drive again, please call.

## 2021-11-21 NOTE — Telephone Encounter (Signed)
I have sent the requested clinical notes to the e-mail provided.

## 2021-12-12 ENCOUNTER — Ambulatory Visit: Payer: Medicare PPO | Admitting: Unknown Physician Specialty

## 2021-12-12 ENCOUNTER — Ambulatory Visit (INDEPENDENT_AMBULATORY_CARE_PROVIDER_SITE_OTHER): Payer: Medicare PPO | Admitting: Unknown Physician Specialty

## 2021-12-12 ENCOUNTER — Encounter: Payer: Self-pay | Admitting: Unknown Physician Specialty

## 2021-12-12 DIAGNOSIS — E785 Hyperlipidemia, unspecified: Secondary | ICD-10-CM

## 2021-12-12 DIAGNOSIS — E1169 Type 2 diabetes mellitus with other specified complication: Secondary | ICD-10-CM

## 2021-12-12 DIAGNOSIS — E1159 Type 2 diabetes mellitus with other circulatory complications: Secondary | ICD-10-CM | POA: Diagnosis not present

## 2021-12-12 DIAGNOSIS — E119 Type 2 diabetes mellitus without complications: Secondary | ICD-10-CM | POA: Diagnosis not present

## 2021-12-12 DIAGNOSIS — I152 Hypertension secondary to endocrine disorders: Secondary | ICD-10-CM

## 2021-12-12 NOTE — Progress Notes (Signed)
BP 136/76 (BP Location: Left Arm, Cuff Size: Normal)   Pulse 65   Temp (!) 97.5 F (36.4 C) (Oral)   Wt 207 lb (93.9 kg)   SpO2 98%   BMI 29.70 kg/m    Subjective:    Patient ID: Donald Le, male    DOB: 1951-03-10, 71 y.o.   MRN: [id number]  HPI: Donald Le is a 71 y.o. male  Chief Complaint  Patient presents with   Diabetes    No recent eye exam per patient   Hypertension   Diabetes: Pt is here for f/u of his BS.  Last Hgb AiC was 6.9% and started Metformin twice a day.   Using medications without difficulties No hypoglycemic episodes No hyperglycemic episodes Feet problems:none Blood Sugars averaging:132 average eye exam within last year: Planning on getting an eye exam  Hypertension  Using medications without difficulty Average home BPs "about the same as here"   Using medication without problems or lightheadedness No chest pain with exertion or shortness of breath No Edema  Elevated Cholesterol Using medications without problems No Muscle aches  Diet: Exercise: Works  Relevant past medical, surgical, family and social history reviewed and updated as indicated. Interim medical history since our last visit reviewed. Allergies and medications reviewed and updated.  Review of Systems  Constitutional: Negative.   Respiratory: Negative.    Cardiovascular: Negative.   Genitourinary: Negative.   Skin: Negative.   Neurological: Negative.   Psychiatric/Behavioral: Negative.      Per HPI unless specifically indicated above     Objective:    BP 136/76 (BP Location: Left Arm, Cuff Size: Normal)   Pulse 65   Temp (!) 97.5 F (36.4 C) (Oral)   Wt 207 lb (93.9 kg)   SpO2 98%   BMI 29.70 kg/m   Wt Readings from Last 3 Encounters:  12/12/21 207 lb (93.9 kg)  11/08/21 210 lb (95.3 kg)  10/12/21 214 lb (97.1 kg)    Physical Exam Constitutional:      General: He is not in acute distress.    Appearance: Normal appearance. He is  well-developed.  HENT:     Head: Normocephalic and atraumatic.  Eyes:     General: Lids are normal. No scleral icterus.       Right eye: No discharge.        Left eye: No discharge.     Conjunctiva/sclera: Conjunctivae normal.  Neck:     Vascular: No carotid bruit or JVD.  Cardiovascular:     Rate and Rhythm: Normal rate and regular rhythm.     Heart sounds: Normal heart sounds.  Pulmonary:     Effort: Pulmonary effort is normal. No respiratory distress.     Breath sounds: Normal breath sounds.  Abdominal:     Palpations: There is no hepatomegaly or splenomegaly.  Musculoskeletal:        General: Normal range of motion.     Cervical back: Normal range of motion and neck supple.  Skin:    General: Skin is warm and dry.     Coloration: Skin is not pale.     Findings: No rash.  Neurological:     Mental Status: He is alert and oriented to person, place, and time.  Psychiatric:        Behavior: Behavior normal.        Thought Content: Thought content normal.        Judgment: Judgment normal.     Results for  orders placed or performed in visit on 10/12/21  Microscopic Examination   Urine  Result Value Ref Range   WBC, UA None seen 0 - 5 /hpf   RBC, Urine None seen 0 - 2 /hpf   Epithelial Cells (non renal) None seen 0 - 10 /hpf   Mucus, UA Present (A) Not Estab.   Bacteria, UA None seen None seen/Few  CBC with Differential/Platelet  Result Value Ref Range   WBC 8.3 3.4 - 10.8 x10E3/uL   RBC 5.49 4.14 - 5.80 x10E6/uL   Hemoglobin 14.6 13.0 - 17.7 g/dL   Hematocrit 44.7 37.5 - 51.0 %   MCV 81 79 - 97 fL   MCH 26.6 26.6 - 33.0 pg   MCHC 32.7 31.5 - 35.7 g/dL   RDW 15.0 11.6 - 15.4 %   Platelets 216 150 - 450 x10E3/uL   Neutrophils 63 Not Estab. %   Lymphs 20 Not Estab. %   Monocytes 11 Not Estab. %   Eos 3 Not Estab. %   Basos 1 Not Estab. %   Neutrophils Absolute 5.3 1.4 - 7.0 x10E3/uL   Lymphocytes Absolute 1.7 0.7 - 3.1 x10E3/uL   Monocytes Absolute 0.9 0.1 - 0.9  x10E3/uL   EOS (ABSOLUTE) 0.2 0.0 - 0.4 x10E3/uL   Basophils Absolute 0.1 0.0 - 0.2 x10E3/uL   Immature Granulocytes 2 Not Estab. %   Immature Grans (Abs) 0.1 0.0 - 0.1 x10E3/uL  Comprehensive metabolic panel  Result Value Ref Range   Glucose 93 70 - 99 mg/dL   BUN 13 8 - 27 mg/dL   Creatinine, Ser 1.14 0.76 - 1.27 mg/dL   eGFR 70 >59 mL/min/1.73   BUN/Creatinine Ratio 11 10 - 24   Sodium 141 134 - 144 mmol/L   Potassium 4.2 3.5 - 5.2 mmol/L   Chloride 106 96 - 106 mmol/L   CO2 21 20 - 29 mmol/L   Calcium 9.0 8.6 - 10.2 mg/dL   Total Protein 7.0 6.0 - 8.5 g/dL   Albumin 3.8 3.8 - 4.8 g/dL   Globulin, Total 3.2 1.5 - 4.5 g/dL   Albumin/Globulin Ratio 1.2 1.2 - 2.2   Bilirubin Total 0.3 0.0 - 1.2 mg/dL   Alkaline Phosphatase 49 44 - 121 IU/L   AST 20 0 - 40 IU/L   ALT 30 0 - 44 IU/L  TSH  Result Value Ref Range   TSH 13.500 (H) 0.450 - 4.500 uIU/mL  Urinalysis, Routine w reflex microscopic  Result Value Ref Range   Specific Gravity, UA 1.025 1.005 - 1.030   pH, UA 7.0 5.0 - 7.5   Color, UA Yellow Yellow   Appearance Ur Clear Clear   Leukocytes,UA Negative Negative   Protein,UA 1+ (A) Negative/Trace   Glucose, UA Negative Negative   Ketones, UA Trace (A) Negative   RBC, UA Negative Negative   Bilirubin, UA Negative Negative   Urobilinogen, Ur 2.0 (H) 0.2 - 1.0 mg/dL   Nitrite, UA Negative Negative   Microscopic Examination See below:       Assessment & Plan:   Problem List Items Addressed This Visit       Unprioritized   Diabetes mellitus without complication (HCC)    Taking Metformin and doing well.  Hgb A1C just 2 months ago and was 6.9.  Will recheck in 3 months      Relevant Medications   losartan (COZAAR) 100 MG tablet   Hyperlipidemia associated with type 2 diabetes mellitus (HCC)    Stable,   continue present medications. Check cholesterol next visit       Relevant Medications   hydrochlorothiazide (HYDRODIURIL) 25 MG tablet   losartan (COZAAR) 100 MG  tablet   Hypertension associated with diabetes ([place])    Stable, continue present medications.        Relevant Medications   hydrochlorothiazide (HYDRODIURIL) 25 MG tablet   losartan (COZAAR) 100 MG tablet     Follow up plan: Return in about 3 months (around 03/14/2022).

## 2021-12-12 NOTE — Assessment & Plan Note (Addendum)
Stable, continue present medications. Check cholesterol next visit

## 2021-12-12 NOTE — Assessment & Plan Note (Signed)
Taking Metformin and doing well.  Hgb A1C just 2 months ago and was 6.9.  Will recheck in 3 months

## 2021-12-12 NOTE — Assessment & Plan Note (Signed)
Stable, continue present medications.   

## 2021-12-22 DIAGNOSIS — R569 Unspecified convulsions: Secondary | ICD-10-CM | POA: Diagnosis not present

## 2021-12-23 DIAGNOSIS — R569 Unspecified convulsions: Secondary | ICD-10-CM | POA: Diagnosis not present

## 2021-12-24 DIAGNOSIS — R569 Unspecified convulsions: Secondary | ICD-10-CM | POA: Diagnosis not present

## 2021-12-28 ENCOUNTER — Encounter: Payer: Self-pay | Admitting: Neurology

## 2021-12-28 DIAGNOSIS — R569 Unspecified convulsions: Secondary | ICD-10-CM

## 2021-12-28 NOTE — Procedures (Signed)
Clinical History : This is a 71 y/o M PMH HTN, hyperlipidemia, and DMII who presents with two episodes concerning for seizure. Routine EEG reported as normal.  INTERMITTENT MONITORING with VIDEO TECHNICAL SUMMARY: This AVEEG was performed using equipment provided by [employer] utilizing Bluetooth ( Track[employer] ) amplifiers with continuous EEGT attended video collection using encrypted remote transmission via [place] secured cellular tower network with data rates for each AVEEG performed. This is a Biomedical engineer AVEEG, obtained, according to the 10-20 international electrode placement system, reformatted digitally into referential and bipolar montages. Data was acquired with a minimum of 21 bipolar connections and sampled at a minimum rate of 250 cycles per second per channel, maximum rate of 450 cycles per second per channel and two channels for EKG. The entire VEEG study was recorded through cable and or radio telemetry for subsequent analysis. Specified epochs of the AVEEG data were identified at the direction of the subject by the depression of a push button by the patient. Each patients event file included data acquired two minutes prior to the push button activation and continuing until two minutes afterwards. AVEEG files were reviewed on [place], Licensed Software provided by [employer] with a digital high frequency filter set at 70 Hz and a low frequency filter set at 1 Hz with a paper speed of 75m/s resulting in 10 seconds per digital page. This entire AVEEG was reviewed by the EEG Technologist. Random time samples, random sleep samples, clips, patient initiated push button files with included patient daily diary logs, EEG Technologist pruned data was reviewed and verified for accuracy and validity by the governing reading neurologist in full details. This AEEGV was fully compliant with all requirements for CPT 97500 for setup, patient education, take down and  administered by an EEG technologist. Long-Term EEG with Video was monitored intermittently by a qualified EEG technologist for the entirety of the recording; quality check-ins were performed at a minimum of every two hours, checking and documenting real-time data and video to assure the integrity and quality of the recording (e.g., camera position, electrode integrity and impedance), and identify the need for maintenance. For intermittent monitoring, an EEG Technologist monitored no more than 12 patients concurrently. Diagnostic video was captured at least 80% of the time during the recording.  PATIENT EVENTS: There was one event reported as slight headache with no changes in EEG.  TECHNOLOGIST EVENTS: No clear epileptiform activity was detected by the reviewing neurodiagnostic technologist during the recording for further evaluation.  TIME SAMPLES: 10-minutes of every 2 hours recorded are reviewed as random time samples.  SLEEP SAMPLES: 5-minutes of every 24 hour recorded sleep cycle are reviewed as random sleep samples.  AWAKE: At maximal level of alertness, the posterior dominant background activity was continuous, reactive, low voltage rhythm of 8-9 Hz. This was symmetric, well-modulated, and attenuated with eye opening. Diffuse, symmetric, frontocentral beta range activity was present.  SLEEP: N1 Sleep (Stage 1) was observed and characterized by the disappearance of alpha rhythm and the appearance of vertex activity. N2 Sleep (Stage 2) was observed and characterized by vertex waves, K-complexes, and sleep spindles. N3 (Stage 3) sleep was observed and characterized by high amplitude Delta activity of 20%. REM sleep was observed.  EKG: There were no arrhythmias or abnormalities noted during this recording.   Impression: This is a normal 48 hrs ambulatory video EEG. There was one event described as slight headache with no changes in EEG background.   AAlric Ran MD  [facility]

## 2021-12-29 ENCOUNTER — Encounter: Payer: Self-pay | Admitting: Neurology

## 2022-01-05 ENCOUNTER — Ambulatory Visit: Payer: Self-pay | Admitting: *Deleted

## 2022-01-05 ENCOUNTER — Other Ambulatory Visit: Payer: Self-pay | Admitting: Family

## 2022-01-05 MED ORDER — METFORMIN HCL 500 MG PO TABS: 500.0000 mg | ORAL_TABLET | Freq: Every day | ORAL | 0 refills | Status: DC

## 2022-01-05 MED ORDER — HYDROCHLOROTHIAZIDE 25 MG PO TABS: 25.0000 mg | ORAL_TABLET | Freq: Every day | ORAL | 0 refills | Status: DC

## 2022-01-05 NOTE — Telephone Encounter (Signed)
Medication Refill - Medication: atorvastatin (LIPITOR) 40 MG tablet  Has the patient contacted their pharmacy? Yes.    (Agent: If yes, when and what did the pharmacy advise?) provider has not responded  Preferred Pharmacy (with phone number or street name):  [id number] [place]  [phone] Fax:  [phone]    Has the patient been seen for an appointment in the last year OR does the patient have an upcoming appointment? Yes.    Agent: Please be advised that RX refills may take up to 3 business days. We ask that you follow-up with your pharmacy.

## 2022-01-05 NOTE — Telephone Encounter (Signed)
LOV 12/12/21  Future Appt 03/15/22 with Malachy Mood

## 2022-01-05 NOTE — Telephone Encounter (Signed)
  Chief Complaint: medication assistance Symptoms: NA Frequency: today Pertinent Negatives: NA Disposition: '[]'$ ED /'[]'$ Urgent Care (no appt availability in office) / '[]'$ Appointment(In office/virtual)/ '[]'$  [facility]/ '[x]'$ Home Care/ '[]'$ Refused Recommended Disposition /'[]'$ [facility]/ '[]'$  Follow-up with PCP Additional Notes: advised pt that directions on current rx is 1 tab with breakfast. Pt states he was unsure why pharmacy changed directions but he thought he was suppose to only take 1 tab daily not 2. I advised him that is correct to just disregard directions. Pt also asking for 90 day supply on refills.    Reason for Disposition  Caller has medicine question only, adult not sick, AND triager answers question  Answer Assessment - Initial Assessment Questions 1. NAME of MEDICINE: "What medicine(s) are you calling about?"     metformin 2. QUESTION: "What is your question?" (e.g., double dose of medicine, side effect)     Pu from pharmacy and directions states 2 tabs daily. Pt thought he suppose to take 1 tab daily 3. PRESCRIBER: "Who prescribed the medicine?" Reason: if prescribed by specialist, call should be referred to that group.     Dr. Neomia Dear  Protocols used: Medication Question Call-A-AH

## 2022-01-05 NOTE — Telephone Encounter (Signed)
Summary: discuss medication   Patient states he picked up metFORMIN (GLUCOPHAGE) 500 MG tablet rx from pharmacy on 8-10 and directions is 2 tablets daily   Patient inquiring if this is correct   Please fu w/ patient      Per chart:  metFORMIN (GLUCOPHAGE) 500 MG tablet 500 mg, Daily with breakfast  Do not see any changes  Attempted to call patient- left message to call office

## 2022-01-05 NOTE — Telephone Encounter (Signed)
Requested medication (s) are due for refill today: yes  Requested medication (s) are on the active medication list: yes  Last refill:  hctz 11/10/21, metformin 10/19/21 #30/2  Future visit scheduled: yes  Notes to clinic:  pt meets protocol but was Dr. Neomia Dear pt. Needing 90 day supply refills. Please advise      Requested Prescriptions  Pending Prescriptions Disp Refills   hydrochlorothiazide (HYDRODIURIL) 25 MG tablet 90 tablet 0    Sig: Take 1 tablet (25 mg total) by mouth daily.     Cardiovascular: Diuretics - Thiazide Passed - 01/05/2022 10:01 AM      Passed - Cr in normal range and within 180 days    Creatinine, Ser  Date Value Ref Range Status  10/12/2021 1.14 0.76 - 1.27 mg/dL Final         Passed - K in normal range and within 180 days    Potassium  Date Value Ref Range Status  10/12/2021 4.2 3.5 - 5.2 mmol/L Final         Passed - Na in normal range and within 180 days    Sodium  Date Value Ref Range Status  10/12/2021 141 134 - 144 mmol/L Final         Passed - Last BP in normal range    BP Readings from Last 1 Encounters:  12/12/21 136/76         Passed - Valid encounter within last 6 months    Recent Outpatient Visits           3 weeks ago Hypertension associated with diabetes ([place])   [facility] Kathrine Haddock, NP   2 months ago Type 2 diabetes mellitus with hyperlipidemia ([place])   [facility] Vigg, Avanti, MD   2 months ago Seizure disorder ([place])   [facility] Vigg, Avanti, MD   3 months ago Diabetes mellitus without complication ([place])   [facility] Vigg, Avanti, MD   1 year ago Routine general medical examination at a health care facility   [place], [place], DO       Future Appointments             In 2 months Kathrine Haddock, NP [facility]   In 9 months [name], MD [facility]             metFORMIN (GLUCOPHAGE) 500  MG tablet 30 tablet 2    Sig: Take 1 tablet (500 mg total) by mouth daily with breakfast.     Endocrinology:  Diabetes - Biguanides Passed - 01/05/2022 10:01 AM      Passed - Cr in normal range and within 360 days    Creatinine, Ser  Date Value Ref Range Status  10/12/2021 1.14 0.76 - 1.27 mg/dL Final         Passed - HBA1C is between 0 and 7.9 and within 180 days    HB A1C (BAYER DCA - WAIVED)  Date Value Ref Range Status  09/12/2021 6.5 (H) 4.8 - 5.6 % Final    Comment:             Prediabetes: 5.7 - 6.4          Diabetes: >6.4          Glycemic control for adults with diabetes: <7.0    Hgb A1c MFr Bld  Date Value Ref Range Status  10/08/2021 6.9 (H) 4.8 - 5.6 % Final  Comment:    (NOTE) Pre diabetes:          5.7%-6.4%  Diabetes:              >6.4%  Glycemic control for   <7.0% adults with diabetes          Passed - eGFR in normal range and within 360 days    GFR calc Af Amer  Date Value Ref Range Status  06/27/2020 68 >59 mL/min/1.73 Final    Comment:    **In accordance with recommendations from the NKF-ASN Task force,**   [facility] is in the process of updating its eGFR calculation to the   2021 CKD-EPI creatinine equation that estimates kidney function   without a race variable.    GFR, Estimated  Date Value Ref Range Status  10/09/2021 58 (L) >60 mL/min Final    Comment:    (NOTE) Calculated using the CKD-EPI Creatinine Equation (2021)    eGFR  Date Value Ref Range Status  10/12/2021 70 >59 mL/min/1.73 Final         Passed - B12 Level in normal range and within 720 days    Vitamin B-12  Date Value Ref Range Status  09/12/2021 619 232 - 1,245 pg/mL Final         Passed - Valid encounter within last 6 months    Recent Outpatient Visits           3 weeks ago Hypertension associated with diabetes ([place])   [place] Kathrine Haddock, NP   2 months ago Type 2 diabetes mellitus with hyperlipidemia ([place])   [facility]  Vigg, Avanti, MD   2 months ago Seizure disorder ([place])   [facility] Vigg, Avanti, MD   3 months ago Diabetes mellitus without complication ([place])   [facility] Vigg, Avanti, MD   1 year ago Routine general medical examination at a health care facility   [place], [place], DO       Future Appointments             In 2 months Kathrine Haddock, NP [facility], [place]   In 9 months [name], MD [place] - CBC within normal limits and completed in the last 12 months    WBC  Date Value Ref Range Status  10/12/2021 8.3 3.4 - 10.8 x10E3/uL Final  10/09/2021 8.6 4.0 - 10.5 K/uL Final   RBC  Date Value Ref Range Status  10/12/2021 5.49 4.14 - 5.80 x10E6/uL Final  10/09/2021 4.84 4.22 - 5.81 MIL/uL Final   Hemoglobin  Date Value Ref Range Status  10/12/2021 14.6 13.0 - 17.7 g/dL Final   Hematocrit  Date Value Ref Range Status  10/12/2021 44.7 37.5 - 51.0 % Final   MCHC  Date Value Ref Range Status  10/12/2021 32.7 31.5 - 35.7 g/dL Final  10/09/2021 33.3 30.0 - 36.0 g/dL Final   [facility]  Date Value Ref Range Status  10/12/2021 26.6 26.6 - 33.0 pg Final  10/09/2021 26.7 26.0 - 34.0 pg Final   MCV  Date Value Ref Range Status  10/12/2021 81 79 - 97 fL Final   No results found for: "PLTCOUNTKUC", "LABPLAT", "POCPLA" RDW  Date Value Ref Range Status  10/12/2021 15.0 11.6 - 15.4 % Final

## 2022-01-09 ENCOUNTER — Other Ambulatory Visit: Payer: Self-pay

## 2022-01-09 DIAGNOSIS — E782 Mixed hyperlipidemia: Secondary | ICD-10-CM

## 2022-01-09 MED ORDER — ATORVASTATIN CALCIUM 40 MG PO TABS: 20.0000 mg | ORAL_TABLET | Freq: Every day | ORAL | 0 refills | Status: DC

## 2022-01-09 NOTE — Telephone Encounter (Signed)
LOV:  12/12/21   Future Visit: 03/13/22

## 2022-03-12 ENCOUNTER — Other Ambulatory Visit: Payer: Self-pay

## 2022-03-12 NOTE — Telephone Encounter (Signed)
Pt states he will call back to schedule an appointment

## 2022-03-12 NOTE — Telephone Encounter (Signed)
Medication refill for Metformin 500 mg last ov 12/12/21, upcoming ov no future appt . Please advise

## 2022-03-12 NOTE — Telephone Encounter (Signed)
appt

## 2022-03-13 ENCOUNTER — Ambulatory Visit: Payer: Medicare PPO | Admitting: Physician Assistant

## 2022-04-05 ENCOUNTER — Other Ambulatory Visit: Payer: Self-pay | Admitting: Family Medicine

## 2022-04-05 DIAGNOSIS — E782 Mixed hyperlipidemia: Secondary | ICD-10-CM

## 2022-04-05 NOTE — Telephone Encounter (Signed)
Requested Prescriptions  Pending Prescriptions Disp Refills   atorvastatin (LIPITOR) 40 MG tablet [Pharmacy Med Name: ATORVASTATIN 40 MG TABLET] 45 tablet 0    Sig: TAKE 0.5 TABLETS (20 MG TOTAL) BY MOUTH DAILY. 1/2 TABLET DAILY     Cardiovascular:  Antilipid - Statins Failed - 04/05/2022  2:24 AM      Failed - Lipid Panel in normal range within the last 12 months    Cholesterol, Total  Date Value Ref Range Status  09/12/2021 145 100 - 199 mg/dL Final   Cholesterol Piccolo, Waived  Date Value Ref Range Status  09/20/2016 206 (H) <200 mg/dL Final    Comment:                            Desirable                <200                         Borderline High      200- 239                         High                     >239    LDL Chol Calc (NIH)  Date Value Ref Range Status  09/12/2021 83 0 - 99 mg/dL Final   HDL  Date Value Ref Range Status  09/12/2021 45 >39 mg/dL Final   Triglycerides  Date Value Ref Range Status  09/12/2021 92 0 - 149 mg/dL Final   Triglycerides Piccolo,Waived  Date Value Ref Range Status  09/20/2016 122 <150 mg/dL Final    Comment:                            Normal                   <150                         Borderline High     150 - 199                         High                200 - 499                         Very High                >499          Passed - Patient is not pregnant      Passed - Valid encounter within last 12 months    Recent Outpatient Visits           3 months ago Hypertension associated with diabetes ([place])   [place], Malachy Mood, NP   5 months ago Type 2 diabetes mellitus with hyperlipidemia ([place])   [facility] Vigg, Avanti, MD   5 months ago Seizure disorder ([place])   [facility] Vigg, Avanti, MD   6 months ago Diabetes mellitus without complication ([place])   [facility] Vigg, Avanti, MD   1 year ago Routine general medical examination at a health  care facility    [place], Barb Merino, DO       Future Appointments             In 1 week Valerie Roys, DO [facility], [place]   In 6 months [name], MD [place]

## 2022-04-11 ENCOUNTER — Other Ambulatory Visit: Payer: Self-pay | Admitting: Family Medicine

## 2022-04-11 NOTE — Telephone Encounter (Signed)
Requested Prescriptions  Pending Prescriptions Disp Refills   metFORMIN (GLUCOPHAGE) 500 MG tablet [Pharmacy Med Name: METFORMIN HCL 500 MG TABLET] 90 tablet 0    Sig: TAKE 1 TABLET BY MOUTH EVERY DAY WITH BREAKFAST     Endocrinology:  Diabetes - Biguanides Failed - 04/11/2022  1:57 AM      Failed - HBA1C is between 0 and 7.9 and within 180 days    HB A1C (BAYER DCA - WAIVED)  Date Value Ref Range Status  09/12/2021 6.5 (H) 4.8 - 5.6 % Final    Comment:             Prediabetes: 5.7 - 6.4          Diabetes: >6.4          Glycemic control for adults with diabetes: <7.0    Hgb A1c MFr Bld  Date Value Ref Range Status  10/08/2021 6.9 (H) 4.8 - 5.6 % Final    Comment:    (NOTE) Pre diabetes:          5.7%-6.4%  Diabetes:              >6.4%  Glycemic control for   <7.0% adults with diabetes          Passed - Cr in normal range and within 360 days    Creatinine, Ser  Date Value Ref Range Status  10/12/2021 1.14 0.76 - 1.27 mg/dL Final         Passed - eGFR in normal range and within 360 days    GFR calc Af Amer  Date Value Ref Range Status  06/27/2020 68 >59 mL/min/1.73 Final    Comment:    **In accordance with recommendations from the NKF-ASN Task force,**   [facility] is in the process of updating its eGFR calculation to the   2021 CKD-EPI creatinine equation that estimates kidney function   without a race variable.    GFR, Estimated  Date Value Ref Range Status  10/09/2021 58 (L) >60 mL/min Final    Comment:    (NOTE) Calculated using the CKD-EPI Creatinine Equation (2021)    eGFR  Date Value Ref Range Status  10/12/2021 70 >59 mL/min/1.73 Final         Passed - B12 Level in normal range and within 720 days    Vitamin B-12  Date Value Ref Range Status  09/12/2021 619 232 - 1,245 pg/mL Final         Passed - Valid encounter within last 6 months    Recent Outpatient Visits           4 months ago Hypertension associated with diabetes ([place])   [facility] Kathrine Haddock, NP   5 months ago Type 2 diabetes mellitus with hyperlipidemia ([place])   [facility] Vigg, Avanti, MD   6 months ago Seizure disorder ([place])   [place] Vigg, Avanti, MD   7 months ago Diabetes mellitus without complication ([place])   [facility] Vigg, Avanti, MD   1 year ago Routine general medical examination at a health care facility   [place], [place], DO       Future Appointments             In 5 days Wynetta Emery, Barb Merino, [place], [place]   In 5 months [name], MD [place]  within normal limits and completed in the last 12 months    WBC  Date Value Ref Range Status  10/12/2021 8.3 3.4 - 10.8 x10E3/uL Final  10/09/2021 8.6 4.0 - 10.5 K/uL Final   RBC  Date Value Ref Range Status  10/12/2021 5.49 4.14 - 5.80 x10E6/uL Final  10/09/2021 4.84 4.22 - 5.81 MIL/uL Final   Hemoglobin  Date Value Ref Range Status  10/12/2021 14.6 13.0 - 17.7 g/dL Final   Hematocrit  Date Value Ref Range Status  10/12/2021 44.7 37.5 - 51.0 % Final   MCHC  Date Value Ref Range Status  10/12/2021 32.7 31.5 - 35.7 g/dL Final  10/09/2021 33.3 30.0 - 36.0 g/dL Final   [facility]  Date Value Ref Range Status  10/12/2021 26.6 26.6 - 33.0 pg Final  10/09/2021 26.7 26.0 - 34.0 pg Final   MCV  Date Value Ref Range Status  10/12/2021 81 79 - 97 fL Final   No results found for: "PLTCOUNTKUC", "LABPLAT", "POCPLA" RDW  Date Value Ref Range Status  10/12/2021 15.0 11.6 - 15.4 % Final

## 2022-04-16 ENCOUNTER — Ambulatory Visit (INDEPENDENT_AMBULATORY_CARE_PROVIDER_SITE_OTHER): Payer: Medicare PPO | Admitting: Family Medicine

## 2022-04-16 ENCOUNTER — Encounter: Payer: Self-pay | Admitting: Family Medicine

## 2022-04-16 VITALS — BP 158/80 | HR 76 | Temp 98.3°F | Ht 70.0 in | Wt 215.8 lb

## 2022-04-16 DIAGNOSIS — R972 Elevated prostate specific antigen [PSA]: Secondary | ICD-10-CM | POA: Diagnosis not present

## 2022-04-16 DIAGNOSIS — Z0000 Encounter for general adult medical examination without abnormal findings: Secondary | ICD-10-CM

## 2022-04-16 DIAGNOSIS — E1169 Type 2 diabetes mellitus with other specified complication: Secondary | ICD-10-CM | POA: Diagnosis not present

## 2022-04-16 DIAGNOSIS — E782 Mixed hyperlipidemia: Secondary | ICD-10-CM

## 2022-04-16 DIAGNOSIS — E1129 Type 2 diabetes mellitus with other diabetic kidney complication: Secondary | ICD-10-CM | POA: Diagnosis not present

## 2022-04-16 DIAGNOSIS — E1159 Type 2 diabetes mellitus with other circulatory complications: Secondary | ICD-10-CM | POA: Diagnosis not present

## 2022-04-16 DIAGNOSIS — R809 Proteinuria, unspecified: Secondary | ICD-10-CM | POA: Diagnosis not present

## 2022-04-16 DIAGNOSIS — Z23 Encounter for immunization: Secondary | ICD-10-CM

## 2022-04-16 DIAGNOSIS — I152 Hypertension secondary to endocrine disorders: Secondary | ICD-10-CM | POA: Diagnosis not present

## 2022-04-16 DIAGNOSIS — G40909 Epilepsy, unspecified, not intractable, without status epilepticus: Secondary | ICD-10-CM | POA: Diagnosis not present

## 2022-04-16 DIAGNOSIS — E785 Hyperlipidemia, unspecified: Secondary | ICD-10-CM | POA: Diagnosis not present

## 2022-04-16 DIAGNOSIS — N400 Enlarged prostate without lower urinary tract symptoms: Secondary | ICD-10-CM | POA: Diagnosis not present

## 2022-04-16 DIAGNOSIS — E039 Hypothyroidism, unspecified: Secondary | ICD-10-CM

## 2022-04-16 LAB — URINALYSIS, ROUTINE W REFLEX MICROSCOPIC
Bilirubin, UA: NEGATIVE
Glucose, UA: NEGATIVE
Ketones, UA: NEGATIVE
Leukocytes,UA: NEGATIVE
Nitrite, UA: NEGATIVE
Protein,UA: NEGATIVE
RBC, UA: NEGATIVE
Specific Gravity, UA: 1.015 (ref 1.005–1.030)
Urobilinogen, Ur: 1 mg/dL (ref 0.2–1.0)
pH, UA: 8.5 — ABNORMAL HIGH (ref 5.0–7.5)

## 2022-04-16 LAB — MICROALBUMIN, URINE WAIVED
Creatinine, Urine Waived: 100 mg/dL (ref 10–300)
Microalb, Ur Waived: 30 mg/L — ABNORMAL HIGH (ref 0–19)
Microalb/Creat Ratio: <30 mg/g (ref <30)

## 2022-04-16 LAB — BAYER DCA HB A1C WAIVED: HB A1C (BAYER DCA - WAIVED): 6.8 % — ABNORMAL HIGH (ref 4.8–5.6)

## 2022-04-16 MED ORDER — LOSARTAN POTASSIUM 100 MG PO TABS: 100.0000 mg | ORAL_TABLET | Freq: Every day | ORAL | 1 refills | Status: DC

## 2022-04-16 MED ORDER — AMLODIPINE BESYLATE 10 MG PO TABS: 10.0000 mg | ORAL_TABLET | Freq: Every day | ORAL | 1 refills | Status: DC

## 2022-04-16 MED ORDER — ATORVASTATIN CALCIUM 40 MG PO TABS: 20.0000 mg | ORAL_TABLET | Freq: Every day | ORAL | 1 refills | Status: DC

## 2022-04-16 MED ORDER — METFORMIN HCL 500 MG PO TABS: 500.0000 mg | ORAL_TABLET | Freq: Two times a day (BID) | ORAL | 1 refills | Status: DC

## 2022-04-16 MED ORDER — HYDROCHLOROTHIAZIDE 25 MG PO TABS: 25.0000 mg | ORAL_TABLET | Freq: Every day | ORAL | 1 refills | Status: DC

## 2022-04-16 MED ORDER — EMPAGLIFLOZIN 10 MG PO TABS: 10.0000 mg | ORAL_TABLET | Freq: Every day | ORAL | 1 refills | Status: DC

## 2022-04-16 NOTE — Assessment & Plan Note (Signed)
Under good control on current regimen. Continue current regimen. Continue to monitor. Call with any concerns. Refills given. Labs drawn today.   

## 2022-04-16 NOTE — Assessment & Plan Note (Signed)
Doing well with A1c of 6.8. Continue current regimen. Continue to monitor. Refills given.

## 2022-04-16 NOTE — Assessment & Plan Note (Signed)
No seizures in years. Not on any medicine. Continue to monitor. Call with any concerns.

## 2022-04-16 NOTE — Assessment & Plan Note (Signed)
BP running high. Will monitor at home and work on [employer]. Refills given. Call with any concerns. Continue to monitor.

## 2022-04-16 NOTE — Assessment & Plan Note (Signed)
Labs drawn today. Await results.
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
## 2022-04-16 NOTE — Assessment & Plan Note (Signed)
Doing well not on on any medicines. Continue to monitor. Call with any concerns.

## 2022-04-17 LAB — CBC WITH DIFFERENTIAL/PLATELET
Basophils Absolute: 0.1 10*3/uL (ref 0.0–0.2)
Basos: 1 %
EOS (ABSOLUTE): 0.1 10*3/uL (ref 0.0–0.4)
Eos: 3 %
Hematocrit: 45.2 % (ref 37.5–51.0)
Hemoglobin: 15.1 g/dL (ref 13.0–17.7)
Immature Grans (Abs): 0 10*3/uL (ref 0.0–0.1)
Immature Granulocytes: 0 %
Lymphocytes Absolute: 1.7 10*3/uL (ref 0.7–3.1)
Lymphs: 34 %
MCH: 27.2 pg (ref 26.6–33.0)
MCHC: 33.4 g/dL (ref 31.5–35.7)
MCV: 81 fL (ref 79–97)
Monocytes Absolute: 0.4 10*3/uL (ref 0.1–0.9)
Monocytes: 7 %
Neutrophils Absolute: 2.7 10*3/uL (ref 1.4–7.0)
Neutrophils: 55 %
Platelets: 188 10*3/uL (ref 150–450)
RBC: 5.55 x10E6/uL (ref 4.14–5.80)
RDW: 15 % (ref 11.6–15.4)
WBC: 4.9 10*3/uL (ref 3.4–10.8)

## 2022-04-17 LAB — COMPREHENSIVE METABOLIC PANEL
ALT: 20 IU/L (ref 0–44)
AST: 24 IU/L (ref 0–40)
Albumin/Globulin Ratio: 1.8 (ref 1.2–2.2)
Albumin: 4.4 g/dL (ref 3.9–4.9)
Alkaline Phosphatase: 57 IU/L (ref 44–121)
BUN/Creatinine Ratio: 13 (ref 10–24)
BUN: 17 mg/dL (ref 8–27)
Bilirubin Total: 0.3 mg/dL (ref 0.0–1.2)
CO2: 22 mmol/L (ref 20–29)
Calcium: 9.6 mg/dL (ref 8.6–10.2)
Chloride: 102 mmol/L (ref 96–106)
Creatinine, Ser: 1.29 mg/dL — ABNORMAL HIGH (ref 0.76–1.27)
Globulin, Total: 2.5 g/dL (ref 1.5–4.5)
Glucose: 112 mg/dL — ABNORMAL HIGH (ref 70–99)
Potassium: 3.7 mmol/L (ref 3.5–5.2)
Sodium: 139 mmol/L (ref 134–144)
Total Protein: 6.9 g/dL (ref 6.0–8.5)
eGFR: 60 mL/min/{1.73_m2} (ref >59)

## 2022-04-17 LAB — LIPID PANEL W/O CHOL/HDL RATIO
Cholesterol, Total: 148 mg/dL (ref 100–199)
HDL: 43 mg/dL (ref >39)
LDL Chol Calc (NIH): 65 mg/dL (ref 0–99)
Triglycerides: 252 mg/dL — ABNORMAL HIGH (ref 0–149)
VLDL Cholesterol Cal: 40 mg/dL (ref 5–40)

## 2022-04-17 LAB — TSH: TSH: 5.26 u[IU]/mL — ABNORMAL HIGH (ref 0.450–4.500)

## 2022-04-17 LAB — PSA: Prostate Specific Ag, Serum: 7.9 ng/mL — ABNORMAL HIGH (ref 0.0–4.0)

## 2022-04-24 ENCOUNTER — Telehealth: Payer: Self-pay | Admitting: Family Medicine

## 2022-04-24 NOTE — Telephone Encounter (Signed)
Copied from [place] #[id number]. Topic: General - Other >> Apr 24, 2022  9:15 AM Chapman Fitch wrote: Reason for CRM: Pt has been taking Metformin but he also received a prescription for Jardiance / he would like to speak with nurse or Dr. Wynetta Emery about this medication and if he is suppose to be taking his as well as Metformin / please advise

## 2022-04-24 NOTE — Telephone Encounter (Signed)
Spoke with patient to follow up. Patient informed me that he has not picked up the prescription for Jardiance, as he had discussed with previously with Dr.Vigg that the Jardiance prescription was too expensive. Patient says he was also told by Dr.Vigg that the metformin was a better option for the patient. Patient is asking does he need the Jardiance. Please advise?

## 2022-04-26 NOTE — Telephone Encounter (Signed)
It was still on his medication list, so I refilled it- has he been taking it? If so, I'd like him to continue it. If not, he doesn't need to continue it- if he is not taking it, please remove from medication list.

## 2022-04-27 NOTE — Telephone Encounter (Signed)
Spoke with patient. Patient is not taking medication and it has been removed from his current medication list.

## 2022-04-27 NOTE — Addendum Note (Signed)
Addended by: Valerie Roys on: 04/27/2022 09:12 AM   Modules accepted: Orders

## 2022-04-30 ENCOUNTER — Ambulatory Visit: Payer: Medicare PPO | Admitting: Neurology

## 2022-05-02 ENCOUNTER — Encounter: Payer: Self-pay | Admitting: Neurology

## 2022-05-02 ENCOUNTER — Ambulatory Visit: Payer: Medicare PPO | Admitting: Neurology

## 2022-05-02 VITALS — BP 139/89 | HR 81 | Ht 70.0 in | Wt 215.0 lb

## 2022-05-02 DIAGNOSIS — R569 Unspecified convulsions: Secondary | ICD-10-CM

## 2022-05-02 NOTE — Patient Instructions (Signed)
Continue current medications  Follow up as needed
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
## 2022-05-29 ENCOUNTER — Other Ambulatory Visit: Payer: Self-pay | Admitting: Family Medicine

## 2022-05-29 DIAGNOSIS — E782 Mixed hyperlipidemia: Secondary | ICD-10-CM

## 2022-05-30 NOTE — Telephone Encounter (Signed)
Rx was sent to pharmacy for 90 DS on 04/16/22 #45/1.   Requested Prescriptions  Pending Prescriptions Disp Refills   atorvastatin (LIPITOR) 40 MG tablet [Pharmacy Med Name: ATORVASTATIN 40 MG TABLET] 135 tablet 1    Sig: TAKE 0.5 TABLETS (20 MG TOTAL) BY MOUTH DAILY. 1/2 TABLET DAILY     Cardiovascular:  Antilipid - Statins Failed - 05/29/2022  1:30 PM      Failed - Lipid Panel in normal range within the last 12 months    Cholesterol, Total  Date Value Ref Range Status  04/16/2022 148 100 - 199 mg/dL Final   Cholesterol Piccolo, Waived  Date Value Ref Range Status  09/20/2016 206 (H) <200 mg/dL Final    Comment:                            Desirable                <200                         Borderline High      200- 239                         High                     >239    LDL Chol Calc (NIH)  Date Value Ref Range Status  04/16/2022 65 0 - 99 mg/dL Final   HDL  Date Value Ref Range Status  04/16/2022 43 >39 mg/dL Final   Triglycerides  Date Value Ref Range Status  04/16/2022 252 (H) 0 - 149 mg/dL Final   Triglycerides Piccolo,Waived  Date Value Ref Range Status  09/20/2016 122 <150 mg/dL Final    Comment:                            Normal                   <150                         Borderline High     150 - 199                         High                200 - 499                         Very High                >499          Passed - Patient is not pregnant      Passed - Valid encounter within last 12 months    Recent Outpatient Visits           1 month ago Routine general medical examination at a health care facility   [facility], Megan P, DO   5 months ago Hypertension associated with diabetes ([place])   [facility] Kathrine Haddock, NP   7 months ago Type 2 diabetes mellitus with hyperlipidemia ([place])   [facility] Vigg, Avanti, MD   7 months ago Seizure disorder ([place])  [place], MD   8 months ago Diabetes mellitus without complication [facility])   [place], MD       Future Appointments             In 1 month Johnson, Barb Merino, DO [employer], [place]   In 4 months [name], MD [place]

## 2022-06-08 ENCOUNTER — Telehealth: Payer: Self-pay | Admitting: Family Medicine

## 2022-06-08 ENCOUNTER — Other Ambulatory Visit: Payer: Medicare PPO

## 2022-06-08 DIAGNOSIS — E039 Hypothyroidism, unspecified: Secondary | ICD-10-CM

## 2022-06-08 NOTE — Telephone Encounter (Signed)
Spoke with patient and advised him to reach out to his pharmacy as he should have one more refill at his local pharmacy. Patient verbalized understanding and has no further questions at this time.

## 2022-06-08 NOTE — Telephone Encounter (Signed)
PT came in for his labs and stated that he was needing a refill on his Metformin since he is taking 2 pills a day rather than 1.  Please advise.

## 2022-06-09 LAB — THYROID PANEL WITH TSH
Free Thyroxine Index: 1.2 (ref 1.2–4.9)
T3 Uptake Ratio: 28 % (ref 24–39)
T4, Total: 4.3 ug/dL — ABNORMAL LOW (ref 4.5–12.0)
TSH: 5.97 u[IU]/mL — ABNORMAL HIGH (ref 0.450–4.500)

## 2022-06-18 ENCOUNTER — Other Ambulatory Visit: Payer: Self-pay | Admitting: Family Medicine

## 2022-06-18 DIAGNOSIS — E039 Hypothyroidism, unspecified: Secondary | ICD-10-CM

## 2022-06-18 MED ORDER — LEVOTHYROXINE SODIUM 25 MCG PO TABS: 25.0000 ug | ORAL_TABLET | Freq: Every day | ORAL | 1 refills | Status: DC

## 2022-06-26 ENCOUNTER — Other Ambulatory Visit: Payer: Self-pay | Admitting: Family Medicine

## 2022-06-26 DIAGNOSIS — H2513 Age-related nuclear cataract, bilateral: Secondary | ICD-10-CM | POA: Diagnosis not present

## 2022-06-26 DIAGNOSIS — E782 Mixed hyperlipidemia: Secondary | ICD-10-CM

## 2022-06-26 DIAGNOSIS — E119 Type 2 diabetes mellitus without complications: Secondary | ICD-10-CM | POA: Diagnosis not present

## 2022-06-26 DIAGNOSIS — H401134 Primary open-angle glaucoma, bilateral, indeterminate stage: Secondary | ICD-10-CM | POA: Diagnosis not present

## 2022-06-26 LAB — HM DIABETES EYE EXAM

## 2022-06-26 NOTE — Telephone Encounter (Signed)
Unable to refill per protocol, Rx request is too soon. Last refill 04/16/22 for 90 days and 1 refill. Patient should have 1 refill available for pick up.  Requested Prescriptions  Pending Prescriptions Disp Refills   metFORMIN (GLUCOPHAGE) 500 MG tablet [Pharmacy Med Name: METFORMIN HCL 500 MG TABLET] 90 tablet     Sig: TAKE 1 TABLET BY MOUTH EVERY DAY WITH BREAKFAST     Endocrinology:  Diabetes - Biguanides Failed - 06/26/2022  8:31 AM      Failed - Cr in normal range and within 360 days    Creatinine, Ser  Date Value Ref Range Status  04/16/2022 1.29 (H) 0.76 - 1.27 mg/dL Final         Passed - HBA1C is between 0 and 7.9 and within 180 days    HB A1C (BAYER DCA - WAIVED)  Date Value Ref Range Status  04/16/2022 6.8 (H) 4.8 - 5.6 % Final    Comment:             Prediabetes: 5.7 - 6.4          Diabetes: >6.4          Glycemic control for adults with diabetes: <7.0          Passed - eGFR in normal range and within 360 days    GFR calc Af Amer  Date Value Ref Range Status  06/27/2020 68 >59 mL/min/1.73 Final    Comment:    **In accordance with recommendations from the NKF-ASN Task force,**   [facility] is in the process of updating its eGFR calculation to the   2021 CKD-EPI creatinine equation that estimates kidney function   without a race variable.    GFR, Estimated  Date Value Ref Range Status  10/09/2021 58 (L) >60 mL/min Final    Comment:    (NOTE) Calculated using the CKD-EPI Creatinine Equation (2021)    eGFR  Date Value Ref Range Status  04/16/2022 60 >59 mL/min/1.73 Final         Passed - B12 Level in normal range and within 720 days    Vitamin B-12  Date Value Ref Range Status  09/12/2021 619 232 - 1,245 pg/mL Final         Passed - Valid encounter within last 6 months    Recent Outpatient Visits           2 months ago Routine general medical examination at a health care facility   [facility], Megan P, DO   6 months  ago Hypertension associated with diabetes [facility])   [place] Kathrine Haddock, NP   8 months ago Type 2 diabetes mellitus with hyperlipidemia ([place])   [place] Vigg, Avanti, MD   8 months ago Seizure disorder [facility])   [place] Vigg, Avanti, MD   9 months ago Diabetes mellitus without complication [facility])   [place] Vigg, Avanti, MD       Future Appointments             In 2 weeks Valerie Roys, DO [place], [place]   In 4 weeks [name], MD [place]   In 3 months [name], MD [facility] [place]            Passed - CBC within normal limits and completed in the last 12 months    WBC  Date Value Ref Range Status  04/16/2022 4.9 3.4 - 10.8 x10E3/uL Final  10/09/2021 8.6 4.0 - 10.5 K/uL Final   RBC  Date Value Ref Range Status  04/16/2022 5.55 4.14 - 5.80 x10E6/uL Final  10/09/2021 4.84 4.22 - 5.81 MIL/uL Final   Hemoglobin  Date Value Ref Range Status  04/16/2022 15.1 13.0 - 17.7 g/dL Final   Hematocrit  Date Value Ref Range Status  04/16/2022 45.2 37.5 - 51.0 % Final   MCHC  Date Value Ref Range Status  04/16/2022 33.4 31.5 - 35.7 g/dL Final  10/09/2021 33.3 30.0 - 36.0 g/dL Final   [facility]  Date Value Ref Range Status  04/16/2022 27.2 26.6 - 33.0 pg Final  10/09/2021 26.7 26.0 - 34.0 pg Final   MCV  Date Value Ref Range Status  04/16/2022 81 79 - 97 fL Final   No results found for: "PLTCOUNTKUC", "LABPLAT", "POCPLA" RDW  Date Value Ref Range Status  04/16/2022 15.0 11.6 - 15.4 % Final          atorvastatin (LIPITOR) 40 MG tablet [Pharmacy Med Name: ATORVASTATIN 40 MG TABLET] 45 tablet 1    Sig: TAKE 0.5 TABLETS (20 MG TOTAL) BY MOUTH DAILY. 1/2 TABLET DAILY     Cardiovascular:  Antilipid - Statins Failed - 06/26/2022  8:31 AM      Failed - Lipid Panel in normal range within the  last 12 months    Cholesterol, Total  Date Value Ref Range Status  04/16/2022 148 100 - 199 mg/dL Final   Cholesterol Piccolo, Waived  Date Value Ref Range Status  09/20/2016 206 (H) <200 mg/dL Final    Comment:                            Desirable                <200                         Borderline High      200- 239                         High                     >239    LDL Chol Calc (NIH)  Date Value Ref Range Status  04/16/2022 65 0 - 99 mg/dL Final   HDL  Date Value Ref Range Status  04/16/2022 43 >39 mg/dL Final   Triglycerides  Date Value Ref Range Status  04/16/2022 252 (H) 0 - 149 mg/dL Final   Triglycerides Piccolo,Waived  Date Value Ref Range Status  09/20/2016 122 <150 mg/dL Final    Comment:                            Normal                   <150                         Borderline High     150 - 199                         High  200 - 73                         Very High                >499          Passed - Patient is not pregnant      Passed - Valid encounter within last 12 months    Recent Outpatient Visits           2 months ago Routine general medical examination at a health care facility   [facility], [place] P, DO   6 months ago Hypertension associated with diabetes [facility])   [place] Kathrine Haddock, NP   8 months ago Type 2 diabetes mellitus with hyperlipidemia ([place])   [place] Vigg, Avanti, MD   8 months ago Seizure disorder [facility])   [place] Vigg, Avanti, MD   9 months ago Diabetes mellitus without complication [facility])   [place] Vigg, Avanti, MD       Future Appointments             In 2 weeks Wynetta Emery, Barb Merino, DO [place], [place]   In 4 weeks [name], MD [place]   In 3 months [name], [place]

## 2022-06-26 NOTE — Telephone Encounter (Signed)
Pt called in and stated that the pharmacy advised him that the refills needed the doctor's approval.  Please advise.

## 2022-06-27 ENCOUNTER — Ambulatory Visit: Payer: Medicare PPO | Admitting: Urology

## 2022-07-10 ENCOUNTER — Ambulatory Visit: Payer: Medicare PPO | Admitting: Family Medicine

## 2022-07-11 ENCOUNTER — Other Ambulatory Visit: Payer: Self-pay | Admitting: Family Medicine

## 2022-07-11 NOTE — Telephone Encounter (Signed)
Unable to refill per protocol, Rx request is too soon. Last refill 06/18/22.  Requested Prescriptions  Pending Prescriptions Disp Refills   levothyroxine (SYNTHROID) 25 MCG tablet [Pharmacy Med Name: LEVOTHYROXINE 25 MCG TABLET] 90 tablet 1    Sig: TAKE 1 TABLET BY MOUTH EVERY DAY     Endocrinology:  Hypothyroid Agents Failed - 07/11/2022 12:32 PM      Failed - TSH in normal range and within 360 days    TSH  Date Value Ref Range Status  06/08/2022 5.970 (H) 0.450 - 4.500 uIU/mL Final         Passed - Valid encounter within last 12 months    Recent Outpatient Visits           2 months ago Routine general medical examination at a health care facility   [facility], [place], DO   7 months ago Hypertension associated with diabetes [facility])   [place] Kathrine Haddock, NP   8 months ago Type 2 diabetes mellitus with hyperlipidemia ([place])   [place] Vigg, Avanti, MD   9 months ago Seizure disorder [facility])   [place] Vigg, Avanti, MD   10 months ago Diabetes mellitus without complication [facility])   [place] Vigg, Avanti, MD       Future Appointments             In 1 week [name], MD [place]   In 2 weeks Valerie Roys, DO [place], [place]   In 2 months [name], [place]

## 2022-07-17 ENCOUNTER — Ambulatory Visit: Payer: Medicare PPO | Admitting: Family Medicine

## 2022-07-24 ENCOUNTER — Ambulatory Visit: Payer: Medicare PPO | Admitting: Urology

## 2022-07-24 VITALS — BP 149/87 | HR 63 | Ht 70.0 in | Wt 213.0 lb

## 2022-07-24 DIAGNOSIS — N529 Male erectile dysfunction, unspecified: Secondary | ICD-10-CM

## 2022-07-24 DIAGNOSIS — R972 Elevated prostate specific antigen [PSA]: Secondary | ICD-10-CM

## 2022-07-24 MED ORDER — AMBULATORY NON FORMULARY MEDICATION: 0 refills | Status: DC

## 2022-07-24 NOTE — Patient Instructions (Signed)
Trimix Injection Training  The provider will prescribe the Trimix medication to Donald Le.  You will need to pick up the medication at:   [address]  [place], [place] [address] [phone]  Once you have the medication in hand you will need to call our office to schedule a morning ONLY appointment to have injection teaching. The medication will induce an erection. The medication is estimated at $80.00. This medication is time sensitive as it expires quickly.   You will need to have a normal blood pressure in order to be injected. Your blood pressure must be under 140/90. We will check your blood pressure multiple times to ensure the reading is correct, if you BP continues to be elevated we will need to reschedule your appointment.
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
## 2022-07-30 ENCOUNTER — Ambulatory Visit
Admission: RE | Admit: 2022-07-30 | Discharge: 2022-07-30 | Disposition: A | Discharge status: Home / Self Care | Payer: Medicare PPO | Source: Ambulatory Visit | Attending: Urology | Admitting: Urology

## 2022-07-30 DIAGNOSIS — N400 Enlarged prostate without lower urinary tract symptoms: Secondary | ICD-10-CM | POA: Diagnosis not present

## 2022-07-30 DIAGNOSIS — R972 Elevated prostate specific antigen [PSA]: Secondary | ICD-10-CM | POA: Insufficient documentation

## 2022-07-30 MED ORDER — GADOBUTROL 1 MMOL/ML IV SOLN
9.0000 mL | Freq: Once | INTRAVENOUS | Status: AC | PRN
  Administered 2022-07-30: 9 mL via INTRAVENOUS

## 2022-07-31 ENCOUNTER — Encounter: Payer: Self-pay | Admitting: Family Medicine

## 2022-07-31 ENCOUNTER — Other Ambulatory Visit: Payer: Medicare PPO

## 2022-07-31 ENCOUNTER — Ambulatory Visit (INDEPENDENT_AMBULATORY_CARE_PROVIDER_SITE_OTHER): Payer: Medicare PPO | Admitting: Family Medicine

## 2022-07-31 VITALS — BP 133/82 | HR 73 | Temp 98.2°F | Ht 70.0 in | Wt 216.3 lb

## 2022-07-31 DIAGNOSIS — E782 Mixed hyperlipidemia: Secondary | ICD-10-CM | POA: Diagnosis not present

## 2022-07-31 DIAGNOSIS — R972 Elevated prostate specific antigen [PSA]: Secondary | ICD-10-CM

## 2022-07-31 DIAGNOSIS — E039 Hypothyroidism, unspecified: Secondary | ICD-10-CM

## 2022-07-31 DIAGNOSIS — I10 Essential (primary) hypertension: Secondary | ICD-10-CM

## 2022-07-31 DIAGNOSIS — E1129 Type 2 diabetes mellitus with other diabetic kidney complication: Secondary | ICD-10-CM

## 2022-07-31 DIAGNOSIS — R809 Proteinuria, unspecified: Secondary | ICD-10-CM | POA: Diagnosis not present

## 2022-07-31 LAB — BAYER DCA HB A1C WAIVED: HB A1C (BAYER DCA - WAIVED): 7.1 % — ABNORMAL HIGH (ref 4.8–5.6)

## 2022-07-31 MED ORDER — AMLODIPINE BESYLATE 10 MG PO TABS: 10.0000 mg | ORAL_TABLET | Freq: Every day | ORAL | 0 refills | Status: DC

## 2022-07-31 MED ORDER — HYDROCHLOROTHIAZIDE 25 MG PO TABS: 25.0000 mg | ORAL_TABLET | Freq: Every day | ORAL | 0 refills | Status: DC

## 2022-07-31 MED ORDER — LOSARTAN POTASSIUM 100 MG PO TABS: 100.0000 mg | ORAL_TABLET | Freq: Every day | ORAL | 0 refills | Status: DC

## 2022-07-31 MED ORDER — ATORVASTATIN CALCIUM 40 MG PO TABS: 20.0000 mg | ORAL_TABLET | Freq: Every day | ORAL | 0 refills | Status: DC

## 2022-07-31 MED ORDER — METFORMIN HCL 500 MG PO TABS: 500.0000 mg | ORAL_TABLET | Freq: Two times a day (BID) | ORAL | 0 refills | Status: DC

## 2022-07-31 NOTE — Progress Notes (Signed)
BP 133/82 (BP Location: Left Arm, Cuff Size: Normal)   Pulse 73   Temp 98.2 F (36.8 C) (Oral)   Ht '5\' 10"'$  (1.778 m)   Wt 216 lb 4.8 oz (98.1 kg)   SpO2 96%   BMI 31.04 kg/m    Subjective:    Patient ID: Donald Le, male    DOB: 1951-01-10, 72 y.o.   MRN: [id number]  HPI: Donald Le is a 72 y.o. male  Chief Complaint  Patient presents with   Diabetes   DIABETES Hypoglycemic episodes:no Polydipsia/polyuria: no Visual disturbance: no Chest pain: no Paresthesias: no Glucose Monitoring: rarely Taking Insulin?: no Blood Pressure Monitoring: not checking Retinal Examination: Up to Date Foot Exam: Up to Date Diabetic Education: Completed Pneumovax: Up to Date Influenza: Up to Date Aspirin: yes  HYPOTHYROIDISM Thyroid control status:stable Satisfied with current treatment? yes Medication side effects: no Medication compliance: excellent compliance Recent dose adjustment:yes Fatigue: no Cold intolerance: no Heat intolerance: no Weight gain: no Weight loss: no Constipation: no Diarrhea/loose stools: no Palpitations: no Lower extremity edema: no Anxiety/depressed mood: no  Relevant past medical, surgical, family and social history reviewed and updated as indicated. Interim medical history since our last visit reviewed. Allergies and medications reviewed and updated.  Review of Systems  Constitutional: Negative.   Respiratory: Negative.    Cardiovascular: Negative.   Gastrointestinal: Negative.   Musculoskeletal: Negative.   Psychiatric/Behavioral: Negative.      Per HPI unless specifically indicated above     Objective:    BP 133/82 (BP Location: Left Arm, Cuff Size: Normal)   Pulse 73   Temp 98.2 F (36.8 C) (Oral)   Ht '5\' 10"'$  (1.778 m)   Wt 216 lb 4.8 oz (98.1 kg)   SpO2 96%   BMI 31.04 kg/m   Wt Readings from Last 3 Encounters:  07/31/22 216 lb 4.8 oz (98.1 kg)  07/24/22 213 lb (96.6 kg)  05/02/22 215 lb (97.5 kg)     Physical Exam Vitals and nursing note reviewed.  Constitutional:      General: He is not in acute distress.    Appearance: Normal appearance. He is not ill-appearing, toxic-appearing or diaphoretic.  HENT:     Head: Normocephalic and atraumatic.     Right Ear: External ear normal.     Left Ear: External ear normal.     Nose: Nose normal.     Mouth/Throat:     Mouth: Mucous membranes are moist.     Pharynx: Oropharynx is clear.  Eyes:     General: No scleral icterus.       Right eye: No discharge.        Left eye: No discharge.     Extraocular Movements: Extraocular movements intact.     Conjunctiva/sclera: Conjunctivae normal.     Pupils: Pupils are equal, round, and reactive to light.  Cardiovascular:     Rate and Rhythm: Normal rate and regular rhythm.     Pulses: Normal pulses.     Heart sounds: Normal heart sounds. No murmur heard.    No friction rub. No gallop.  Pulmonary:     Effort: Pulmonary effort is normal. No respiratory distress.     Breath sounds: Normal breath sounds. No stridor. No wheezing, rhonchi or rales.  Chest:     Chest wall: No tenderness.  Musculoskeletal:        General: Normal range of motion.     Cervical back: Normal range of motion and neck  supple.  Skin:    General: Skin is warm and dry.     Capillary Refill: Capillary refill takes less than 2 seconds.     Coloration: Skin is not jaundiced or pale.     Findings: No bruising, erythema, lesion or rash.  Neurological:     General: No focal deficit present.     Mental Status: He is alert and oriented to person, place, and time. Mental status is at baseline.  Psychiatric:        Mood and Affect: Mood normal.        Behavior: Behavior normal.        Thought Content: Thought content normal.        Judgment: Judgment normal.     Results for orders placed or performed in visit on 07/31/22  Bayer DCA Hb A1c Waived  Result Value Ref Range   HB A1C (BAYER DCA - WAIVED) 7.1 (H) 4.8 - 5.6 %       Assessment & Plan:   Problem List Items Addressed This Visit       Endocrine   Diabetes mellitus, type 2 ([place]) - Primary    Chronic, stable. Doing well with A1c of 7.1, above target goal. Continue taking Metformin 500 mg x 2/day. Refills given. Return in 3 months for follow up.       Relevant Medications   atorvastatin (LIPITOR) 40 MG tablet   losartan (COZAAR) 100 MG tablet   metFORMIN (GLUCOPHAGE) 500 MG tablet   Other Relevant Orders   Bayer DCA Hb A1c Waived (Completed)   Acquired hypothyroidism    Chronic, stable. Doing well on Synthroid, 25 mcg. Labs drawn today. Recommend increasing dose if TSH is elevated. Return in 3 months.       Other Visit Diagnoses     Mixed hyperlipidemia       Relevant Medications   amLODipine (NORVASC) 10 MG tablet   atorvastatin (LIPITOR) 40 MG tablet   hydrochlorothiazide (HYDRODIURIL) 25 MG tablet   losartan (COZAAR) 100 MG tablet   Primary hypertension       Relevant Medications   amLODipine (NORVASC) 10 MG tablet   atorvastatin (LIPITOR) 40 MG tablet   hydrochlorothiazide (HYDRODIURIL) 25 MG tablet   losartan (COZAAR) 100 MG tablet        Follow up plan: Return in about 2 months (around 09/30/2022) for Diabetes/TSH/HTN.

## 2022-07-31 NOTE — Progress Notes (Signed)
BP 133/82 (BP Location: Left Arm, Cuff Size: Normal)   Pulse 73   Temp 98.2 F (36.8 C) (Oral)   Ht '5\' 10"'$  (1.778 m)   Wt 216 lb 4.8 oz (98.1 kg)   SpO2 96%   BMI 31.04 kg/m    Subjective:    Patient ID: Donald Le, male    DOB: May 15, 1951, 72 y.o.   MRN: [id number]  HPI: Donald Le is a 72 y.o. male  Chief Complaint  Patient presents with   Diabetes   DIABETES Taking Metformin daily Hypoglycemic episodes:no Polydipsia/polyuria: no Visual disturbance: no Chest pain: no Paresthesias: no Glucose Monitoring: yes  Accucheck frequency:  Rarely  Fasting glucose: 130s Blood Pressure Monitoring: rarely sometimes 150s/80s Retinal Examination: Up to Date; being treated for mild glaucoma Foot Exam: Up to Date Diabetic Education: Not Completed Pneumovax: Up to Date Influenza: Up to Date Aspirin: yes   Relevant past medical, surgical, family and social history reviewed and updated as indicated. Interim medical history since our last visit reviewed. Allergies and medications reviewed and updated.  Review of Systems  Constitutional: Negative.  Negative for fatigue and unexpected weight change.  Eyes: Negative.  Negative for visual disturbance.  Respiratory: Negative.    Cardiovascular:  Negative for chest pain, palpitations and leg swelling.  Gastrointestinal:  Negative for constipation and diarrhea.  Endocrine: Negative.  Negative for cold intolerance, heat intolerance, polydipsia, polyphagia and polyuria.  Genitourinary: Negative.  Negative for frequency.  Neurological:  Negative for numbness.  Psychiatric/Behavioral:  Negative for dysphoric mood. The patient is not nervous/anxious.    HYPOTHYROIDISM Thyroid control status:stable Satisfied with current treatment? yes Medication side effects: no Medication compliance: excellent compliance Recent dose adjustment:no Fatigue: no Cold intolerance: no Heat intolerance: no Weight gain: no Weight loss:  no Constipation: no Diarrhea/loose stools: no Palpitations: no Lower extremity edema: no Anxiety/depressed mood: no   Per HPI unless specifically indicated above     Objective:    BP 133/82 (BP Location: Left Arm, Cuff Size: Normal)   Pulse 73   Temp 98.2 F (36.8 C) (Oral)   Ht '5\' 10"'$  (1.778 m)   Wt 216 lb 4.8 oz (98.1 kg)   SpO2 96%   BMI 31.04 kg/m   Wt Readings from Last 3 Encounters:  07/31/22 216 lb 4.8 oz (98.1 kg)  07/24/22 213 lb (96.6 kg)  05/02/22 215 lb (97.5 kg)    Physical Exam Vitals and nursing note reviewed.  Constitutional:      General: He is awake. He is not in acute distress.    Appearance: Normal appearance. He is well-developed and well-groomed. He is not ill-appearing.  HENT:     Head: Normocephalic and atraumatic.     Right Ear: Hearing and external ear normal. No drainage.     Left Ear: Hearing and external ear normal. No drainage.     Nose: Nose normal.  Eyes:     General: Lids are normal.        Right eye: No discharge.        Left eye: No discharge.     Conjunctiva/sclera: Conjunctivae normal.  Cardiovascular:     Rate and Rhythm: Normal rate and regular rhythm.     Pulses:          Carotid pulses are 2+ on the right side and 2+ on the left side.    Heart sounds: Normal heart sounds, S1 normal and S2 normal. No murmur heard.    No gallop.  Pulmonary:     Effort: Pulmonary effort is normal. No accessory muscle usage or respiratory distress.     Breath sounds: Normal breath sounds.  Musculoskeletal:        General: Normal range of motion.     Cervical back: Full passive range of motion without pain and normal range of motion.     Right lower leg: No edema.     Left lower leg: No edema.  Skin:    General: Skin is warm and dry.     Capillary Refill: Capillary refill takes less than 2 seconds.  Neurological:     Mental Status: He is alert and oriented to person, place, and time.  Psychiatric:        Attention and Perception:  Attention normal.        Mood and Affect: Mood normal.        Speech: Speech normal.        Behavior: Behavior normal. Behavior is cooperative.        Thought Content: Thought content normal.     Results for orders placed or performed in visit on 07/03/22  HM DIABETES EYE EXAM  Result Value Ref Range   HM Diabetic Eye Exam No Retinopathy No Retinopathy      Assessment & Plan:   Problem List Items Addressed This Visit       Endocrine   Diabetes mellitus, type 2 ([employer]) - Primary    Chronic, stable. Doing well with A1c of 7.1, above target goal. Continue taking Metformin 500 mg x 2/day. Refills given. Return in 3 months for follow up.       Relevant Medications   atorvastatin (LIPITOR) 40 MG tablet   losartan (COZAAR) 100 MG tablet   metFORMIN (GLUCOPHAGE) 500 MG tablet   Other Relevant Orders   Bayer DCA Hb A1c Waived   Acquired hypothyroidism    Chronic, stable. Doing well on Synthroid, 25 mcg. Labs drawn today. Recommend increasing dose if TSH is elevated. Return in 3 months.       Other Visit Diagnoses     Mixed hyperlipidemia       Relevant Medications   amLODipine (NORVASC) 10 MG tablet   atorvastatin (LIPITOR) 40 MG tablet   hydrochlorothiazide (HYDRODIURIL) 25 MG tablet   losartan (COZAAR) 100 MG tablet   Primary hypertension       Relevant Medications   amLODipine (NORVASC) 10 MG tablet   atorvastatin (LIPITOR) 40 MG tablet   hydrochlorothiazide (HYDRODIURIL) 25 MG tablet   losartan (COZAAR) 100 MG tablet        Follow up plan: Return in about 2 months (around 09/30/2022) for Diabetes/TSH/HTN.

## 2022-07-31 NOTE — Assessment & Plan Note (Signed)
Chronic, stable. Doing well with A1c of 7.1, above target goal. Continue taking Metformin 500 mg x 2/day. Refills given. Return in 3 months for follow up.

## 2022-07-31 NOTE — Assessment & Plan Note (Signed)
Chronic, stable. Doing well on Synthroid, 25 mcg. Labs drawn today. Recommend increasing dose if TSH is elevated. Return in 3 months.

## 2022-08-01 ENCOUNTER — Other Ambulatory Visit: Payer: Self-pay | Admitting: Family Medicine

## 2022-08-01 DIAGNOSIS — E782 Mixed hyperlipidemia: Secondary | ICD-10-CM

## 2022-08-01 LAB — PSA: Prostate Specific Ag, Serum: 7.3 ng/mL — ABNORMAL HIGH (ref 0.0–4.0)

## 2022-08-01 LAB — TSH: TSH: 6.82 u[IU]/mL — ABNORMAL HIGH (ref 0.450–4.500)

## 2022-08-01 NOTE — Progress Notes (Unsigned)
08/02/2022 8:49 AM  Donald Le [birth year] [id number]   Referring provider: Valerie Roys, DO [place],  [place] [id number]  Urological history 1.  Elevated PSA -PSA trend   Prostate Specific Ag, Serum  Latest Ref Rng 0.0 - 4.0 ng/mL  06/26/2018 5.2 (H)   07/27/2019 6.5 (H)   09/02/2019 5.5 (H)   03/07/2020 6.6 (H)   08/30/2020 7.0 (H)   10/04/2020 6.0 (H)   10/02/2021 7.2 (H)   04/16/2022 7.9 (H)   07/31/2022 7.3 (H)     Legend: (H) High  -s/p TURP (2009) -biopsy (2021) negative   -prostate MRI (07/2021) - No findings of intermediate or higher suspicion of prostate cancer are identified.  Prostatomegaly and benign prostatic hypertrophy.  Fatty left spermatic cord.  2. Erectile dysfunction -Contributing factors of age, BPH, hypertension, diabetes, hypothyroidism, hyperlipidemia and history of smoking -SHIM 7 -Failed PDE 5 inhibitors  Chief Complaint  Patient presents with   Erectile Dysfunction    HPI: Donald Le is a 72 y.o. male who presents today for ICI titration.    The procedure is discussed with patient.  He is allowed to ask questions.  Questions were answered to his satisfaction.  We were able to complete the titration.  SHIM 7  Patient is not having spontaneous erections.  He denies any pain or curvature with erections.   PDE 5 inhibitors gave him severe heartburn with equivocal results.   SHIM     Row Name 08/02/22 0806         SHIM: Over the last 6 months:   How do you rate your confidence that you could get and keep an erection? Moderate     When you had erections with sexual stimulation, how often were your erections hard enough for penetration (entering your partner)? Almost Never or Never     During sexual intercourse, how often were you able to maintain your erection after you had penetrated (entered) your partner? Almost Never or Never     During sexual intercourse, how difficult was it to maintain your erection to completion  of intercourse? Extremely Difficult     When you attempted sexual intercourse, how often was it satisfactory for you? Almost Never or Never       SHIM Total Score   SHIM 7              Score: 1-7 Severe ED 8-11 Moderate ED 12-16 Mild-Moderate ED 17-21 Mild ED 22-25 No ED  Physical Exam:  BP (!) 147/93   Pulse 69   Ht '5\' 10"'$  (1.778 m)   Wt 216 lb (98 kg)   BMI 30.99 kg/m   Constitutional:  Well nourished. Alert and oriented, No acute distress. GU: No CVA tenderness.  No bladder fullness or masses.  Patient with uncircumcised phallus.  Foreskin easily retracted   Urethral meatus is patent.  No penile discharge. No penile lesions or rashes. Scrotum without lesions, cysts, rashes and/or edema.   Psychiatric: Normal mood and affect.   Procedure  Patient's left corpus cavernosum is identified.  An area near the base of the penis is cleansed with rubbing alcohol.  Careful to avoid the dorsal vein, 0.2 of Trimix (papaverine 30 mg, phentolamine 1 mg and prostaglandin E1 10 mcg, Lot # [id number]'@35'$  exp # 08/30/2022 is injected at a 90 degree angle into the left corpus cavernosum near the base of the penis.  Patient experienced a semi erection in 15 minutes.  Patient's right corpus cavernosum is identified.  An area near the base of the penis is cleansed with rubbing alcohol.  Careful to avoid the dorsal vein, 0.2 of Trimix (papaverine 30 mg, phentolamine 1 mg and prostaglandin E1 10 mcg, Lot # [id number]'@35'$  exp # 08/30/2022 is injected at a 90 degree angle into the right corpus cavernosum near the base of the penis.  Patient experienced a semi erection in 15 minutes.     Assessment & Plan:    1. Erectile dysfunction -We stopped the Trimix titration at this point as he had to go to work -He states he feels comfortable injecting medication, so with the next injection he will inject 0.4 of the Trimix -Advised patient of the condition of priapism, painful erection lasting for more than four  hours, and to contact the office or seek treatment in the ED immediately    Return for Keep appointment with Dr. Erlene Quan .  Laneta Simmers  [facility] [address] [place], [place] [address] [phone]  I spent 15 minutes on the day of the encounter to include pre-visit record review, face-to-face time with the patient, and post-visit ordering of tests.

## 2022-08-02 ENCOUNTER — Ambulatory Visit: Payer: Medicare PPO | Admitting: Urology

## 2022-08-02 ENCOUNTER — Encounter: Payer: Self-pay | Admitting: Urology

## 2022-08-02 VITALS — BP 147/93 | HR 69 | Ht 70.0 in | Wt 216.0 lb

## 2022-08-02 DIAGNOSIS — N529 Male erectile dysfunction, unspecified: Secondary | ICD-10-CM

## 2022-08-02 NOTE — Telephone Encounter (Signed)
Requested by interface surescripts. Duplicate request. Already signed 07/31/22. Receipt confirmed by pharmacy 07/31/22 at 4:12pm. Requested Prescriptions  Signed Prescriptions Disp Refills   levothyroxine (SYNTHROID) 25 MCG tablet 30 tablet 1    Sig: TAKE 1 TABLET BY MOUTH EVERY DAY     Endocrinology:  Hypothyroid Agents Failed - 08/01/2022  6:05 PM      Failed - TSH in normal range and within 360 days    TSH  Date Value Ref Range Status  07/31/2022 6.820 (H) 0.450 - 4.500 uIU/mL Final         Passed - Valid encounter within last 12 months    Recent Outpatient Visits           2 days ago Type 2 diabetes mellitus with microalbuminuria, without long-term current use of insulin ([place])   [place], Megan P, DO   3 months ago Routine general medical examination at a health care facility   [facility] [place] P, DO   7 months ago Hypertension associated with diabetes [facility])   [place] Kathrine Haddock, NP   9 months ago Type 2 diabetes mellitus with hyperlipidemia ([place])   [place] Vigg, Avanti, MD   9 months ago Seizure disorder [facility])   [place] Charlynne Cousins, MD       Future Appointments             In 2 months [name], MD [place]   In 3 months Wynetta Emery, Barb Merino, DO [place], [facility]            Refused Prescriptions Disp Refills   atorvastatin (LIPITOR) 40 MG tablet [Pharmacy Med Name: ATORVASTATIN 40 MG TABLET] 45 tablet 0    Sig: TAKE 0.5 TABLETS (20 MG TOTAL) BY MOUTH DAILY. 1/2 TABLET DAILY     Cardiovascular:  Antilipid - Statins Failed - 08/01/2022  6:05 PM      Failed - Lipid Panel in normal range within the last 12 months    Cholesterol, Total  Date Value Ref Range Status  04/16/2022 148 100 - 199 mg/dL Final   Cholesterol Piccolo, Waived  Date Value Ref Range Status   09/20/2016 206 (H) <200 mg/dL Final    Comment:                            Desirable                <200                         Borderline High      200- 239                         High                     >239    LDL Chol Calc (NIH)  Date Value Ref Range Status  04/16/2022 65 0 - 99 mg/dL Final   HDL  Date Value Ref Range Status  04/16/2022 43 >39 mg/dL Final   Triglycerides  Date Value Ref Range Status  04/16/2022 252 (H) 0 - 149 mg/dL Final   Triglycerides Piccolo,Waived  Date Value Ref Range Status  09/20/2016 122 <150 mg/dL Final    Comment:  Normal                   <150                         Borderline High     150 - 199                         High                200 - 499                         Very High                >499          Passed - Patient is not pregnant      Passed - Valid encounter within last 12 months    Recent Outpatient Visits           2 days ago Type 2 diabetes mellitus with microalbuminuria, without long-term current use of insulin ([place])   [place], Megan P, DO   3 months ago Routine general medical examination at a health care facility   [facility] [place] P, DO   7 months ago Hypertension associated with diabetes [facility])   [place] Kathrine Haddock, NP   9 months ago Type 2 diabetes mellitus with hyperlipidemia ([place])   [place] Vigg, Avanti, MD   9 months ago Seizure disorder [facility])   [place] Charlynne Cousins, MD       Future Appointments             In 2 months [name], MD [place]   In 3 months Valerie Roys, DO [place], [facility]

## 2022-08-02 NOTE — Patient Instructions (Signed)
TRIMIX SELF-INJECTION INSTRUCTIONS    DETAILED PROCEDURE  1. GETTING SET UP  A. Proper hygiene is important. Wash your hands and keep the penis clean.  B. Assemble the following:  - Bottle of Trimix  - Alcohol pad  - Syringe  C. Keep the Trimix cold by returning the bottle to the refrigerator, or by placing the bottle in a cup of ice.   2. PREPARE THE SYRINGE  A. Wipe the rubber top of the vial with an alcohol pad.  B. After removing the cap of the needle, pull the plunger back to the desired dosage, filling this volume with air. Use a new needle and syringe each time.  C. Insert the needle through the rubber top and inject the air into the vial.  D. Turn the vial with needle and syringe inserted upside down. Pull back on the syringe plunger in a slow and steady motion until the desired dosage is achieved. -You will inject 0.4 E. Tap the side of the syringe (1cc tuberculin syringe with a 29 gauge needle) to allow any air bubbles to float towards the needle. Avoid having these air bubbles in the syringe when self-injecting by first injecting out the collected bubbles that may form.  F. Remove the needle from the bottle and replace the protective cap on the needle.    3. SELECT AND PREPARE THE SITE FOR INJECTION  A. The proper location for injection is at the 9-11 and 1-3 o'clock positions, between the base and mid-portion of the penis.(see diagram) Avoid the midline because of potential for injury to the urethra (6 o'clock; for urinary passage) and the penile arteries and nerves (near 12 o'clock). Avoid any visible veins or arteries on the surface.  B. Grasp and pull the head of the penis toward the side of your leg with the index finger and thumb (use the left hand, if right handed). While maintaining light tension, select a site for injection.  C. Clean the site with an alcohol pad.   4: INJECT TRIMIX AND APPLY COMPRESSION  A. With a steady and continuous motion, penetrate the skin with  the needle at a 90 o angle. The needle should then be advanced to the hub. Slight resistance is encountered as the needle passes into the proper position within the erectile tissue (corporeal body).  B. Inject the Trimix over approximately 4 seconds. Withdraw the needle from the penis and apply compression to the injection site for approximately 1 minute. Several minutes of compression may be required to avoid bleeding, especially if you are an aspirin user.  C. Replace the cap on the needle and dispose of properly.   If you experience a painful erection that will not go down, take four (30 mg) tablets of pseudoephedrine (Sudafed-not the extended release) and if the erection does not go down in the next hour or increases in pain, contact the office immediately or seek treatment in the ED

## 2022-08-02 NOTE — Telephone Encounter (Signed)
Future in 3 months. Requested Prescriptions  Pending Prescriptions Disp Refills   levothyroxine (SYNTHROID) 25 MCG tablet [Pharmacy Med Name: LEVOTHYROXINE 25 MCG TABLET] 30 tablet 1    Sig: TAKE 1 TABLET BY MOUTH EVERY DAY     Endocrinology:  Hypothyroid Agents Failed - 08/01/2022  6:05 PM      Failed - TSH in normal range and within 360 days    TSH  Date Value Ref Range Status  07/31/2022 6.820 (H) 0.450 - 4.500 uIU/mL Final         Passed - Valid encounter within last 12 months    Recent Outpatient Visits           2 days ago Type 2 diabetes mellitus with microalbuminuria, without long-term current use of insulin ([place])   [place], Megan P, DO   3 months ago Routine general medical examination at a health care facility   [facility], [place] P, DO   7 months ago Hypertension associated with diabetes [facility])   [place] Kathrine Haddock, NP   9 months ago Type 2 diabetes mellitus with hyperlipidemia ([place])   [place] Vigg, Avanti, MD   9 months ago Seizure disorder [facility])   [place] Charlynne Cousins, MD       Future Appointments             In 2 months [name], MD [place]   In 3 months Wynetta Emery, Barb Merino, DO [place], [facility]             atorvastatin (LIPITOR) 40 MG tablet [Pharmacy Med Name: ATORVASTATIN 40 MG TABLET] 45 tablet 0    Sig: TAKE 0.5 TABLETS (20 MG TOTAL) BY MOUTH DAILY. 1/2 TABLET DAILY     Cardiovascular:  Antilipid - Statins Failed - 08/01/2022  6:05 PM      Failed - Lipid Panel in normal range within the last 12 months    Cholesterol, Total  Date Value Ref Range Status  04/16/2022 148 100 - 199 mg/dL Final   Cholesterol Piccolo, Waived  Date Value Ref Range Status  09/20/2016 206 (H) <200 mg/dL Final    Comment:                            Desirable                 <200                         Borderline High      200- 239                         High                     >239    LDL Chol Calc (NIH)  Date Value Ref Range Status  04/16/2022 65 0 - 99 mg/dL Final   HDL  Date Value Ref Range Status  04/16/2022 43 >39 mg/dL Final   Triglycerides  Date Value Ref Range Status  04/16/2022 252 (H) 0 - 149 mg/dL Final   Triglycerides Piccolo,Waived  Date Value Ref Range Status  09/20/2016 122 <150 mg/dL Final    Comment:  Normal                   <150                         Borderline High     150 - 199                         High                200 - 499                         Very High                >499          Passed - Patient is not pregnant      Passed - Valid encounter within last 12 months    Recent Outpatient Visits           2 days ago Type 2 diabetes mellitus with microalbuminuria, without long-term current use of insulin ([place])   [place], Megan P, DO   3 months ago Routine general medical examination at a health care facility   [facility], [place] P, DO   7 months ago Hypertension associated with diabetes [facility]   [place] Kathrine Haddock, NP   9 months ago Type 2 diabetes mellitus with hyperlipidemia ([place])   [place] Vigg, Avanti, MD   9 months ago Seizure disorder [facility])   [place] Charlynne Cousins, MD       Future Appointments             In 2 months [name], MD [place]   In 3 months Valerie Roys, DO [place], [facility]

## 2022-08-13 ENCOUNTER — Other Ambulatory Visit: Payer: Self-pay | Admitting: Family Medicine

## 2022-08-13 DIAGNOSIS — E782 Mixed hyperlipidemia: Secondary | ICD-10-CM

## 2022-08-14 ENCOUNTER — Other Ambulatory Visit: Payer: Self-pay | Admitting: Family Medicine

## 2022-08-14 DIAGNOSIS — E039 Hypothyroidism, unspecified: Secondary | ICD-10-CM

## 2022-08-14 MED ORDER — LEVOTHYROXINE SODIUM 50 MCG PO TABS: 50.0000 ug | ORAL_TABLET | Freq: Every day | ORAL | 1 refills | Status: DC

## 2022-08-14 NOTE — Telephone Encounter (Signed)
[facility] [phone] called and spoke to [place], [facility] about the refill(s) atorvastatin requested. Advised it was sent on 07/30/22 #45/0 refill(s). She states it was filled and pt never picked up so would fill again. Will decline request for today.   Requested Prescriptions  Pending Prescriptions Disp Refills   atorvastatin (LIPITOR) 40 MG tablet [Pharmacy Med Name: ATORVASTATIN 40 MG TABLET] 45 tablet 0    Sig: TAKE 0.5 TABLETS (20 MG TOTAL) BY MOUTH DAILY. 1/2 TABLET DAILY     Cardiovascular:  Antilipid - Statins Failed - 08/13/2022 10:02 AM      Failed - Lipid Panel in normal range within the last 12 months    Cholesterol, Total  Date Value Ref Range Status  04/16/2022 148 100 - 199 mg/dL Final   Cholesterol Piccolo, Waived  Date Value Ref Range Status  09/20/2016 206 (H) <200 mg/dL Final    Comment:                            Desirable                <200                         Borderline High      200- 239                         High                     >239    LDL Chol Calc (NIH)  Date Value Ref Range Status  04/16/2022 65 0 - 99 mg/dL Final   HDL  Date Value Ref Range Status  04/16/2022 43 >39 mg/dL Final   Triglycerides  Date Value Ref Range Status  04/16/2022 252 (H) 0 - 149 mg/dL Final   Triglycerides Piccolo,Waived  Date Value Ref Range Status  09/20/2016 122 <150 mg/dL Final    Comment:                            Normal                   <150                         Borderline High     150 - 199                         High                200 - 499                         Very High                >499          Passed - Patient is not pregnant      Passed - Valid encounter within last 12 months    Recent Outpatient Visits           2 weeks ago Type 2 diabetes mellitus with microalbuminuria, without long-term current use of insulin ([place])   [place], Megan P, DO   4 months ago Routine general medical examination at a  health care facility   [place], DO   8 months ago Hypertension associated with diabetes [facility])   [place] Kathrine Haddock, NP   9 months ago Type 2 diabetes mellitus with hyperlipidemia ([place])   [place] Vigg, Avanti, MD   10 months ago Seizure disorder [facility])   [place] Vigg, Loman [place], MD       Future Appointments             In 1 month [name], MD [place]   In 2 months Valerie Roys, DO [place], [facility]

## 2022-08-21 DIAGNOSIS — H401133 Primary open-angle glaucoma, bilateral, severe stage: Secondary | ICD-10-CM | POA: Diagnosis not present

## 2022-09-05 ENCOUNTER — Other Ambulatory Visit: Payer: Self-pay | Admitting: Family Medicine

## 2022-09-06 NOTE — Telephone Encounter (Signed)
Rx was sent to pharmacy on 08/14/22 #30/1 RF. Pt is to have FU TSH done to recheck levels.   Requested Prescriptions  Pending Prescriptions Disp Refills   levothyroxine (SYNTHROID) 50 MCG tablet [Pharmacy Med Name: LEVOTHYROXINE 50 MCG TABLET] 90 tablet 1    Sig: TAKE 1 TABLET BY MOUTH EVERY DAY     Endocrinology:  Hypothyroid Agents Failed - 09/05/2022 12:32 PM      Failed - TSH in normal range and within 360 days    TSH  Date Value Ref Range Status  07/31/2022 6.820 (H) 0.450 - 4.500 uIU/mL Final         Passed - Valid encounter within last 12 months    Recent Outpatient Visits           1 month ago Type 2 diabetes mellitus with microalbuminuria, without long-term current use of insulin (HCC)   [place] [facility] [place], Megan P, DO   4 months ago Routine general medical examination at a health care facility   [facility], [place] P, DO   8 months ago Hypertension associated with diabetes [facility])   [place] [facility] Gabriel Cirri, NP   10 months ago Type 2 diabetes mellitus with hyperlipidemia (HCC)   [place] [facility] Vigg, Avanti, MD   10 months ago Seizure disorder [facility])   [facility] Vigg, Roma Schanz, MD       Future Appointments             In 3 weeks Vanna Scotland, MD [facility] [place]   In 1 month Dorcas Carrow, DO  [facility], [facility]

## 2022-09-09 ENCOUNTER — Other Ambulatory Visit: Payer: Self-pay | Admitting: Family Medicine

## 2022-09-09 DIAGNOSIS — E782 Mixed hyperlipidemia: Secondary | ICD-10-CM

## 2022-09-10 NOTE — Telephone Encounter (Signed)
Requested Prescriptions  Pending Prescriptions Disp Refills   atorvastatin (LIPITOR) 40 MG tablet [Pharmacy Med Name: ATORVASTATIN 40 MG TABLET] 45 tablet 0    Sig: TAKE 0.5 TABLETS (20 MG TOTAL) BY MOUTH DAILY. 1/2 TABLET DAILY     Cardiovascular:  Antilipid - Statins Failed - 09/09/2022  8:53 AM      Failed - Lipid Panel in normal range within the last 12 months    Cholesterol, Total  Date Value Ref Range Status  04/16/2022 148 100 - 199 mg/dL Final   Cholesterol Piccolo, Waived  Date Value Ref Range Status  09/20/2016 206 (H) <200 mg/dL Final    Comment:                            Desirable                <200                         Borderline High      200- 239                         High                     >239    LDL Chol Calc (NIH)  Date Value Ref Range Status  04/16/2022 65 0 - 99 mg/dL Final   HDL  Date Value Ref Range Status  04/16/2022 43 >39 mg/dL Final   Triglycerides  Date Value Ref Range Status  04/16/2022 252 (H) 0 - 149 mg/dL Final   Triglycerides Piccolo,Waived  Date Value Ref Range Status  09/20/2016 122 <150 mg/dL Final    Comment:                            Normal                   <150                         Borderline High     150 - 199                         High                200 - 499                         Very High                >499          Passed - Patient is not pregnant      Passed - Valid encounter within last 12 months    Recent Outpatient Visits           1 month ago Type 2 diabetes mellitus with microalbuminuria, without long-term current use of insulin (HCC)   [place] [facility] [place], Megan P, DO   4 months ago Routine general medical examination at a health care facility   [facility], [place] P, DO   9 months ago Hypertension associated with diabetes [facility])   [place] [facility] Gabriel Cirri, NP   10 months ago Type 2 diabetes mellitus  with  hyperlipidemia (HCC)   [place] [place] [facility] Vigg, Avanti, MD   11 months ago Seizure disorder [facility])   [place] [facility] Vigg, Roma Schanz, MD       Future Appointments             In 3 weeks Vanna Scotland, MD [facility] [place]   In 1 month [place] Benes, Oralia Rud, DO [place] [facility], [facility]

## 2022-09-19 ENCOUNTER — Telehealth: Payer: Self-pay | Admitting: Family Medicine

## 2022-09-19 NOTE — Telephone Encounter (Signed)
Contacted Donald Le to schedule their annual wellness visit. Appointment made for 10/08/2022.  Verlee Rossetti; Care Guide Ambulatory Clinical Support [place] [facility] Direct Dial: [phone]

## 2022-09-25 ENCOUNTER — Other Ambulatory Visit: Payer: Medicare PPO

## 2022-09-25 DIAGNOSIS — E039 Hypothyroidism, unspecified: Secondary | ICD-10-CM

## 2022-09-26 ENCOUNTER — Other Ambulatory Visit: Payer: Self-pay | Admitting: Family Medicine

## 2022-09-26 LAB — TSH: TSH: 4.12 u[IU]/mL (ref 0.450–4.500)

## 2022-09-26 MED ORDER — LEVOTHYROXINE SODIUM 50 MCG PO TABS: 50.0000 ug | ORAL_TABLET | Freq: Every day | ORAL | 3 refills | Status: DC

## 2022-09-28 ENCOUNTER — Other Ambulatory Visit: Payer: Self-pay

## 2022-09-28 DIAGNOSIS — N529 Male erectile dysfunction, unspecified: Secondary | ICD-10-CM

## 2022-09-28 DIAGNOSIS — R972 Elevated prostate specific antigen [PSA]: Secondary | ICD-10-CM

## 2022-10-01 ENCOUNTER — Other Ambulatory Visit: Payer: Medicare PPO

## 2022-10-01 DIAGNOSIS — R972 Elevated prostate specific antigen [PSA]: Secondary | ICD-10-CM

## 2022-10-02 LAB — PSA: Prostate Specific Ag, Serum: 7.3 ng/mL — ABNORMAL HIGH (ref 0.0–4.0)

## 2022-10-03 ENCOUNTER — Ambulatory Visit: Payer: Medicare PPO | Admitting: Urology

## 2022-10-03 VITALS — BP 126/83 | HR 79 | Ht 70.0 in | Wt 212.2 lb

## 2022-10-03 DIAGNOSIS — R972 Elevated prostate specific antigen [PSA]: Secondary | ICD-10-CM | POA: Diagnosis not present

## 2022-10-03 DIAGNOSIS — N401 Enlarged prostate with lower urinary tract symptoms: Secondary | ICD-10-CM

## 2022-10-03 DIAGNOSIS — N529 Male erectile dysfunction, unspecified: Secondary | ICD-10-CM | POA: Diagnosis not present
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
## 2022-10-08 ENCOUNTER — Ambulatory Visit (INDEPENDENT_AMBULATORY_CARE_PROVIDER_SITE_OTHER): Payer: Medicare PPO

## 2022-10-08 VITALS — Ht 70.0 in | Wt 212.0 lb

## 2022-10-08 DIAGNOSIS — Z0000 Encounter for general adult medical examination without abnormal findings: Secondary | ICD-10-CM | POA: Diagnosis not present
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
## 2022-10-08 NOTE — Patient Instructions (Signed)
Donald Le , Thank you for taking time to come for your Medicare Wellness Visit. I appreciate your ongoing commitment to your health goals. Please review the following plan we discussed and let me know if I can assist you in the future.   These are the goals we discussed:  Goals      DIET - EAT MORE FRUITS AND VEGETABLES     Patient Stated     09/30/2020, eat healthy     Patient Stated     Continue current lifestyle        This is a list of the screening recommended for you and due dates:  Health Maintenance  Topic Date Due   Zoster (Shingles) Vaccine (1 of 2) Never done   COVID-19 Vaccine (4 - 2023-24 season) 01/26/2022   Flu Shot  12/27/2022   Hemoglobin A1C  01/31/2023   Yearly kidney function blood test for diabetes  04/17/2023   Yearly kidney health urinalysis for diabetes  04/17/2023   Complete foot exam   04/17/2023   Eye exam for diabetics  06/27/2023   Medicare Annual Wellness Visit  10/08/2023   DTaP/Tdap/Td vaccine (2 - Td or Tdap) 05/10/2024   Colon Cancer Screening  08/30/2029   Pneumonia Vaccine  Completed   Hepatitis C Screening: USPSTF Recommendation to screen - Ages 72-79 yo.  Completed   HPV Vaccine  Aged Out    Advanced directives: no  Conditions/risks identified: none  Next appointment: Follow up in one year for your annual wellness visit. 10/14/23 @ 10:15 am by phone  Preventive Care 65 Years and Older, Male  Preventive care refers to lifestyle choices and visits with your health care provider that can promote health and wellness. What does preventive care include? A yearly physical exam. This is also called an annual well check. Dental exams once or twice a year. Routine eye exams. Ask your health care provider how often you should have your eyes checked. Personal lifestyle choices, including: Daily care of your teeth and gums. Regular physical activity. Eating a healthy diet. Avoiding tobacco and drug use. Limiting alcohol use. Practicing  safe sex. Taking low doses of aspirin every day. Taking vitamin and mineral supplements as recommended by your health care provider. What happens during an annual well check? The services and screenings done by your health care provider during your annual well check will depend on your age, overall health, lifestyle risk factors, and family history of disease. Counseling  Your health care provider may ask you questions about your: Alcohol use. Tobacco use. Drug use. Emotional well-being. Home and relationship well-being. Sexual activity. Eating habits. History of falls. Memory and ability to understand (cognition). Work and work Astronomer. Screening  You may have the following tests or measurements: Height, weight, and BMI. Blood pressure. Lipid and cholesterol levels. These may be checked every 5 years, or more frequently if you are over 26 years old. Skin check. Lung cancer screening. You may have this screening every year starting at age 53 if you have a 30-pack-year history of smoking and currently smoke or have quit within the past 15 years. Fecal occult blood test (FOBT) of the stool. You may have this test every year starting at age 70. Flexible sigmoidoscopy or colonoscopy. You may have a sigmoidoscopy every 5 years or a colonoscopy every 10 years starting at age 40. Prostate cancer screening. Recommendations will vary depending on your family history and other risks. Hepatitis C blood test. Hepatitis B blood test. Sexually transmitted  disease (STD) testing. Diabetes screening. This is done by checking your blood sugar (glucose) after you have not eaten for a while (fasting). You may have this done every 1-3 years. Abdominal aortic aneurysm (AAA) screening. You may need this if you are a current or former smoker. Osteoporosis. You may be screened starting at age 58 if you are at high risk. Talk with your health care provider about your test results, treatment options, and  if necessary, the need for more tests. Vaccines  Your health care provider may recommend certain vaccines, such as: Influenza vaccine. This is recommended every year. Tetanus, diphtheria, and acellular pertussis (Tdap, Td) vaccine. You may need a Td booster every 10 years. Zoster vaccine. You may need this after age 58. Pneumococcal 13-valent conjugate (PCV13) vaccine. One dose is recommended after age 50. Pneumococcal polysaccharide (PPSV23) vaccine. One dose is recommended after age 19. Talk to your health care provider about which screenings and vaccines you need and how often you need them. This information is not intended to replace advice given to you by your health care provider. Make sure you discuss any questions you have with your health care provider. Document Released: 06/10/2015 Document Revised: 02/01/2016 Document Reviewed: 03/15/2015 Elsevier Interactive Patient Education  2017 [place] Prevention in the Home Falls can cause injuries. They can happen to people of all ages. There are many things you can do to make your home safe and to help prevent falls. What can I do on the outside of my home? Regularly fix the edges of walkways and driveways and fix any cracks. Remove anything that might make you trip as you walk through a door, such as a raised step or threshold. Trim any bushes or trees on the path to your home. Use bright outdoor lighting. Clear any walking paths of anything that might make someone trip, such as rocks or tools. Regularly check to see if handrails are loose or broken. Make sure that both sides of any steps have handrails. Any raised decks and porches should have guardrails on the edges. Have any leaves, snow, or ice cleared regularly. Use sand or salt on walking paths during winter. Clean up any spills in your garage right away. This includes oil or grease spills. What can I do in the bathroom? Use night lights. Install grab bars by the  toilet and in the tub and shower. Do not use towel bars as grab bars. Use non-skid mats or decals in the tub or shower. If you need to sit down in the shower, use a plastic, non-slip stool. Keep the floor dry. Clean up any water that spills on the floor as soon as it happens. Remove soap buildup in the tub or shower regularly. Attach bath mats securely with double-sided non-slip rug tape. Do not have throw rugs and other things on the floor that can make you trip. What can I do in the bedroom? Use night lights. Make sure that you have a light by your bed that is easy to reach. Do not use any sheets or blankets that are too big for your bed. They should not hang down onto the floor. Have a firm chair that has side arms. You can use this for support while you get dressed. Do not have throw rugs and other things on the floor that can make you trip. What can I do in the kitchen? Clean up any spills right away. Avoid walking on wet floors. Keep items that you use a  lot in easy-to-reach places. If you need to reach something above you, use a strong step stool that has a grab bar. Keep electrical cords out of the way. Do not use floor polish or wax that makes floors slippery. If you must use wax, use non-skid floor wax. Do not have throw rugs and other things on the floor that can make you trip. What can I do with my stairs? Do not leave any items on the stairs. Make sure that there are handrails on both sides of the stairs and use them. Fix handrails that are broken or loose. Make sure that handrails are as long as the stairways. Check any carpeting to make sure that it is firmly attached to the stairs. Fix any carpet that is loose or worn. Avoid having throw rugs at the top or bottom of the stairs. If you do have throw rugs, attach them to the floor with carpet tape. Make sure that you have a light switch at the top of the stairs and the bottom of the stairs. If you do not have them, ask someone  to add them for you. What else can I do to help prevent falls? Wear shoes that: Do not have high heels. Have rubber bottoms. Are comfortable and fit you well. Are closed at the toe. Do not wear sandals. If you use a stepladder: Make sure that it is fully opened. Do not climb a closed stepladder. Make sure that both sides of the stepladder are locked into place. Ask someone to hold it for you, if possible. Clearly mark and make sure that you can see: Any grab bars or handrails. First and last steps. Where the edge of each step is. Use tools that help you move around (mobility aids) if they are needed. These include: Canes. Walkers. Scooters. Crutches. Turn on the lights when you go into a dark area. Replace any light bulbs as soon as they burn out. Set up your furniture so you have a clear path. Avoid moving your furniture around. If any of your floors are uneven, fix them. If there are any pets around you, be aware of where they are. Review your medicines with your doctor. Some medicines can make you feel dizzy. This can increase your chance of falling. Ask your doctor what other things that you can do to help prevent falls. This information is not intended to replace advice given to you by your health care provider. Make sure you discuss any questions you have with your health care provider. Document Released: 03/10/2009 Document Revised: 10/20/2015 Document Reviewed: 06/18/2014 Elsevier Interactive Patient Education  2017 [employer].

## 2022-10-31 ENCOUNTER — Ambulatory Visit
Admission: RE | Admit: 2022-10-31 | Discharge: 2022-10-31 | Disposition: A | Discharge status: Home / Self Care | Payer: Medicare PPO | Source: Ambulatory Visit | Attending: Family Medicine | Admitting: Family Medicine

## 2022-10-31 ENCOUNTER — Ambulatory Visit
Admission: RE | Admit: 2022-10-31 | Discharge: 2022-10-31 | Disposition: A | Discharge status: Home / Self Care | Payer: Medicare PPO | Attending: Family Medicine | Admitting: Family Medicine

## 2022-10-31 ENCOUNTER — Ambulatory Visit (INDEPENDENT_AMBULATORY_CARE_PROVIDER_SITE_OTHER): Payer: Medicare PPO | Admitting: Family Medicine

## 2022-10-31 ENCOUNTER — Encounter: Payer: Self-pay | Admitting: Family Medicine

## 2022-10-31 VITALS — BP 133/73 | HR 56 | Temp 98.7°F | Wt 210.8 lb

## 2022-10-31 DIAGNOSIS — E785 Hyperlipidemia, unspecified: Secondary | ICD-10-CM | POA: Diagnosis not present

## 2022-10-31 DIAGNOSIS — I152 Hypertension secondary to endocrine disorders: Secondary | ICD-10-CM

## 2022-10-31 DIAGNOSIS — E1169 Type 2 diabetes mellitus with other specified complication: Secondary | ICD-10-CM | POA: Diagnosis not present

## 2022-10-31 DIAGNOSIS — E1129 Type 2 diabetes mellitus with other diabetic kidney complication: Secondary | ICD-10-CM

## 2022-10-31 DIAGNOSIS — M79604 Pain in right leg: Secondary | ICD-10-CM

## 2022-10-31 DIAGNOSIS — E1159 Type 2 diabetes mellitus with other circulatory complications: Secondary | ICD-10-CM | POA: Diagnosis not present

## 2022-10-31 DIAGNOSIS — M1711 Unilateral primary osteoarthritis, right knee: Secondary | ICD-10-CM | POA: Diagnosis not present

## 2022-10-31 DIAGNOSIS — M545 Low back pain, unspecified: Secondary | ICD-10-CM | POA: Diagnosis not present

## 2022-10-31 DIAGNOSIS — E039 Hypothyroidism, unspecified: Secondary | ICD-10-CM | POA: Diagnosis not present

## 2022-10-31 DIAGNOSIS — Z7984 Long term (current) use of oral hypoglycemic drugs: Secondary | ICD-10-CM | POA: Diagnosis not present

## 2022-10-31 DIAGNOSIS — R809 Proteinuria, unspecified: Secondary | ICD-10-CM | POA: Diagnosis not present

## 2022-10-31 LAB — BAYER DCA HB A1C WAIVED: HB A1C (BAYER DCA - WAIVED): 6.9 % — ABNORMAL HIGH (ref 4.8–5.6)

## 2022-10-31 MED ORDER — HYDROCHLOROTHIAZIDE 25 MG PO TABS: 25.0000 mg | ORAL_TABLET | Freq: Every day | ORAL | 1 refills | Status: DC

## 2022-10-31 MED ORDER — ATORVASTATIN CALCIUM 20 MG PO TABS: 20.0000 mg | ORAL_TABLET | Freq: Every day | ORAL | 1 refills | Status: DC

## 2022-10-31 MED ORDER — AMLODIPINE BESYLATE 10 MG PO TABS: 10.0000 mg | ORAL_TABLET | Freq: Every day | ORAL | 1 refills | Status: DC

## 2022-10-31 MED ORDER — LOSARTAN POTASSIUM 100 MG PO TABS: 100.0000 mg | ORAL_TABLET | Freq: Every day | ORAL | 1 refills | Status: DC

## 2022-10-31 MED ORDER — METFORMIN HCL 500 MG PO TABS: 500.0000 mg | ORAL_TABLET | Freq: Two times a day (BID) | ORAL | 1 refills | Status: DC

## 2022-10-31 NOTE — Assessment & Plan Note (Signed)
Under good control on current regimen. Continue current regimen. Continue to monitor. Call with any concerns. Refills given. Labs drawn.   

## 2022-10-31 NOTE — Assessment & Plan Note (Signed)
Rechecking labs today. Await results. Treat as needed.  °

## 2022-10-31 NOTE — Assessment & Plan Note (Signed)
Under good control on current regimen. Continue current regimen. Continue to monitor. Call with any concerns. Refills given. Labs drawn today.   

## 2022-10-31 NOTE — Assessment & Plan Note (Signed)
Doing great with A1c of 6.9. Continue current regimen. Continue to monitor. Call with any concerns.  

## 2022-10-31 NOTE — Progress Notes (Signed)
BP 133/73   Pulse (!) 56   Temp 98.7 F (37.1 C) (Oral)   Wt 210 lb 12.8 oz (95.6 kg)   SpO2 99%   BMI 30.25 kg/m    Subjective:    Patient ID: Donald Le, male    DOB: [birth year], 72 y.o.   MRN: [id number]  HPI: Donald Le is a 72 y.o. male  Chief Complaint  Patient presents with   Diabetes   Hyperlipidemia   Hypertension   Hypothyroidism   HYPOTHYROIDISM Thyroid control status:controlled Satisfied with current treatment? yes Medication side effects: no Medication compliance: excellent compliance Etiology of hypothyroidism:  Recent dose adjustment:no Fatigue: no Cold intolerance: no Heat intolerance: no Weight gain: no Weight loss: no Constipation: no Diarrhea/loose stools: no Palpitations: no Lower extremity edema: no Anxiety/depressed mood: no  HYPERTENSION / HYPERLIPIDEMIA Satisfied with current treatment? yes Duration of hypertension: chronic BP monitoring frequency: not checking BP range:  BP medication side effects: no Past BP meds: amlodipine, losartan, HCTZ Duration of hyperlipidemia: chronic Cholesterol medication side effects: no Cholesterol supplements: none Past cholesterol medications: atorvastatin Medication compliance: excellent compliance Aspirin: yes Recent stressors: no Recurrent headaches: no Visual changes: no Palpitations: no Dyspnea: no Chest pain: no Lower extremity edema: no Dizzy/lightheaded: no  DIABETES Hypoglycemic episodes:no Polydipsia/polyuria: no Visual disturbance: no Chest pain: no Paresthesias: no Glucose Monitoring: occasionally Taking Insulin?: no Blood Pressure Monitoring: not checking Retinal Examination: Up to Date Foot Exam: Up to Date Diabetic Education: Completed Pneumovax: Up to Date Influenza: Up to Date Aspirin: yes  LEG PAIN Duration: months Mechanism of injury: unknown Location: R Lower leg Onset: gradual Severity: moderate Quality: tight and aching Frequency:  constant Radiation: R leg below the knee Aggravating factors: being on his feet Alleviating factors: stretching it Status: worse Treatments attempted: stretching  Relief with NSAIDs?: No NSAIDs Taken Nighttime pain:  no Paresthesias / decreased sensation:  no Bowel / bladder incontinence:  no Fevers:  no Dysuria / urinary frequency:  no   Relevant past medical, surgical, family and social history reviewed and updated as indicated. Interim medical history since our last visit reviewed. Allergies and medications reviewed and updated.  Review of Systems  Constitutional: Negative.   Respiratory: Negative.    Cardiovascular: Negative.   Gastrointestinal: Negative.   Musculoskeletal:  Positive for myalgias. Negative for arthralgias, back pain, gait problem, joint swelling, neck pain and neck stiffness.  Neurological: Negative.   Psychiatric/Behavioral: Negative.      Per HPI unless specifically indicated above     Objective:    BP 133/73   Pulse (!) 56   Temp 98.7 F (37.1 C) (Oral)   Wt 210 lb 12.8 oz (95.6 kg)   SpO2 99%   BMI 30.25 kg/m   Wt Readings from Last 3 Encounters:  10/31/22 210 lb 12.8 oz (95.6 kg)  10/08/22 212 lb (96.2 kg)  10/03/22 212 lb 4 oz (96.3 kg)    Physical Exam Vitals and nursing note reviewed.  Constitutional:      General: He is not in acute distress.    Appearance: Normal appearance. He is not ill-appearing, toxic-appearing or diaphoretic.  HENT:     Head: Normocephalic and atraumatic.     Right Ear: External ear normal.     Left Ear: External ear normal.     Nose: Nose normal.     Mouth/Throat:     Mouth: Mucous membranes are moist.     Pharynx: Oropharynx is clear.  Eyes:  General: No scleral icterus.       Right eye: No discharge.        Left eye: No discharge.     Extraocular Movements: Extraocular movements intact.     Conjunctiva/sclera: Conjunctivae normal.     Pupils: Pupils are equal, round, and reactive to light.   Cardiovascular:     Rate and Rhythm: Normal rate and regular rhythm.     Pulses: Normal pulses.     Heart sounds: Normal heart sounds. No murmur heard.    No friction rub. No gallop.  Pulmonary:     Effort: Pulmonary effort is normal. No respiratory distress.     Breath sounds: Normal breath sounds. No stridor. No wheezing, rhonchi or rales.  Chest:     Chest wall: No tenderness.  Musculoskeletal:        General: Normal range of motion.     Cervical back: Normal range of motion and neck supple.  Skin:    General: Skin is warm and dry.     Capillary Refill: Capillary refill takes less than 2 seconds.     Coloration: Skin is not jaundiced or pale.     Findings: No bruising, erythema, lesion or rash.  Neurological:     General: No focal deficit present.     Mental Status: He is alert and oriented to person, place, and time. Mental status is at baseline.  Psychiatric:        Mood and Affect: Mood normal.        Behavior: Behavior normal.        Thought Content: Thought content normal.        Judgment: Judgment normal.     Results for orders placed or performed in visit on 10/01/22  PSA  Result Value Ref Range   Prostate Specific Ag, Serum 7.3 (H) 0.0 - 4.0 ng/mL      Assessment & Plan:   Problem List Items Addressed This Visit       Cardiovascular and Mediastinum   Hypertension associated with diabetes (HCC)    Under good control on current regimen. Continue current regimen. Continue to monitor. Call with any concerns. Refills given. Labs drawn.       Relevant Medications   atorvastatin (LIPITOR) 20 MG tablet   amLODipine (NORVASC) 10 MG tablet   hydrochlorothiazide (HYDRODIURIL) 25 MG tablet   losartan (COZAAR) 100 MG tablet   metFORMIN (GLUCOPHAGE) 500 MG tablet     Endocrine   Hyperlipidemia associated with type 2 diabetes mellitus (HCC)    Under good control on current regimen. Continue current regimen. Continue to monitor. Call with any concerns. Refills  given. Labs drawn today.       Relevant Medications   atorvastatin (LIPITOR) 20 MG tablet   amLODipine (NORVASC) 10 MG tablet   hydrochlorothiazide (HYDRODIURIL) 25 MG tablet   losartan (COZAAR) 100 MG tablet   metFORMIN (GLUCOPHAGE) 500 MG tablet   Diabetes mellitus, type 2 (HCC)    Doing great with A1c of 6.9. Continue current regimen. Continue to monitor. Call with any concerns.       Relevant Medications   atorvastatin (LIPITOR) 20 MG tablet   losartan (COZAAR) 100 MG tablet   metFORMIN (GLUCOPHAGE) 500 MG tablet   Acquired hypothyroidism    Rechecking labs today. Await results. Treat as needed.       Relevant Orders   TSH   Other Visit Diagnoses     Type 2 diabetes mellitus with microalbuminuria, without long-term current  use of insulin (HCC)    -  Primary   Relevant Medications   atorvastatin (LIPITOR) 20 MG tablet   losartan (COZAAR) 100 MG tablet   metFORMIN (GLUCOPHAGE) 500 MG tablet   Other Relevant Orders   Bayer DCA Hb A1c Waived   CBC with Differential/Platelet   Comprehensive metabolic panel   Lipid Panel w/o Chol/HDL Ratio   Right leg pain       Will check x-rays. Await results. Treat as needed.   Relevant Orders   DG Knee Complete 4 Views Right   DG Lumbar Spine Complete        Follow up plan: Return in about 3 months (around 01/31/2023).

## 2022-11-01 LAB — CBC WITH DIFFERENTIAL/PLATELET
Basophils Absolute: 0 10*3/uL (ref 0.0–0.2)
Basos: 1 %
EOS (ABSOLUTE): 0.2 10*3/uL (ref 0.0–0.4)
Eos: 6 %
Hematocrit: 49.1 % (ref 37.5–51.0)
Hemoglobin: 15.6 g/dL (ref 13.0–17.7)
Immature Grans (Abs): 0 10*3/uL (ref 0.0–0.1)
Immature Granulocytes: 0 %
Lymphocytes Absolute: 1 10*3/uL (ref 0.7–3.1)
Lymphs: 30 %
MCH: 26.6 pg (ref 26.6–33.0)
MCHC: 31.8 g/dL (ref 31.5–35.7)
MCV: 84 fL (ref 79–97)
Monocytes Absolute: 0.5 10*3/uL (ref 0.1–0.9)
Monocytes: 15 %
Neutrophils Absolute: 1.7 10*3/uL (ref 1.4–7.0)
Neutrophils: 48 %
Platelets: 198 10*3/uL (ref 150–450)
RBC: 5.87 x10E6/uL — ABNORMAL HIGH (ref 4.14–5.80)
RDW: 15.6 % — ABNORMAL HIGH (ref 11.6–15.4)
WBC: 3.5 10*3/uL (ref 3.4–10.8)

## 2022-11-01 LAB — LIPID PANEL W/O CHOL/HDL RATIO
Cholesterol, Total: 139 mg/dL (ref 100–199)
HDL: 48 mg/dL (ref >39)
LDL Chol Calc (NIH): 73 mg/dL (ref 0–99)
Triglycerides: 98 mg/dL (ref 0–149)
VLDL Cholesterol Cal: 18 mg/dL (ref 5–40)

## 2022-11-01 LAB — COMPREHENSIVE METABOLIC PANEL
ALT: 17 IU/L (ref 0–44)
AST: 20 IU/L (ref 0–40)
Albumin/Globulin Ratio: 1.5 (ref 1.2–2.2)
Albumin: 4.4 g/dL (ref 3.9–4.9)
Alkaline Phosphatase: 60 IU/L (ref 44–121)
BUN/Creatinine Ratio: 11 (ref 10–24)
BUN: 14 mg/dL (ref 8–27)
Bilirubin Total: 0.4 mg/dL (ref 0.0–1.2)
CO2: 22 mmol/L (ref 20–29)
Calcium: 9.7 mg/dL (ref 8.6–10.2)
Chloride: 103 mmol/L (ref 96–106)
Creatinine, Ser: 1.33 mg/dL — ABNORMAL HIGH (ref 0.76–1.27)
Globulin, Total: 2.9 g/dL (ref 1.5–4.5)
Glucose: 97 mg/dL (ref 70–99)
Potassium: 4 mmol/L (ref 3.5–5.2)
Sodium: 139 mmol/L (ref 134–144)
Total Protein: 7.3 g/dL (ref 6.0–8.5)
eGFR: 58 mL/min/{1.73_m2} — ABNORMAL LOW (ref >59)

## 2022-11-01 LAB — TSH: TSH: 3.08 u[IU]/mL (ref 0.450–4.500)

## 2022-11-06 ENCOUNTER — Telehealth: Payer: Self-pay | Admitting: Family Medicine

## 2022-11-06 NOTE — Telephone Encounter (Signed)
Results are not back. 

## 2022-11-06 NOTE — Telephone Encounter (Signed)
Copied from CRM [id number]. Topic: General - Other >> Nov 06, 2022  9:44 AM Donald Le wrote: Reason for CRM: The patient would like to be contacted to review their xrays from 10/31/22  Please contact further if needed

## 2022-11-08 NOTE — Telephone Encounter (Signed)
Pt is calling back to check on results. Pt states that he is still in pain, having stiffness in his foot, knee is tight, pt states that he can barely drive and cannot drive for long periods of time and states that this is dangerous. Please advise.

## 2022-11-08 NOTE — Telephone Encounter (Signed)
This encounter was created in error - please disregard.

## 2022-11-09 ENCOUNTER — Other Ambulatory Visit: Payer: Self-pay | Admitting: Family Medicine

## 2022-11-09 DIAGNOSIS — M5416 Radiculopathy, lumbar region: Secondary | ICD-10-CM

## 2022-11-09 MED ORDER — PREDNISONE 10 MG PO TABS: ORAL_TABLET | ORAL | 0 refills | Status: DC

## 2022-11-29 ENCOUNTER — Emergency Department
Admission: EM | Admit: 2022-11-29 | Discharge: 2022-11-29 | Disposition: A | Discharge status: Home / Self Care | Payer: Medicare PPO | Attending: Emergency Medicine | Admitting: Emergency Medicine

## 2022-11-29 ENCOUNTER — Encounter: Payer: Self-pay | Admitting: Emergency Medicine

## 2022-11-29 ENCOUNTER — Other Ambulatory Visit: Payer: Self-pay

## 2022-11-29 DIAGNOSIS — I10 Essential (primary) hypertension: Secondary | ICD-10-CM | POA: Diagnosis not present

## 2022-11-29 DIAGNOSIS — G40909 Epilepsy, unspecified, not intractable, without status epilepticus: Secondary | ICD-10-CM | POA: Insufficient documentation

## 2022-11-29 DIAGNOSIS — R0789 Other chest pain: Secondary | ICD-10-CM | POA: Diagnosis not present

## 2022-11-29 DIAGNOSIS — R001 Bradycardia, unspecified: Secondary | ICD-10-CM | POA: Diagnosis not present

## 2022-11-29 DIAGNOSIS — R1013 Epigastric pain: Secondary | ICD-10-CM | POA: Insufficient documentation

## 2022-11-29 DIAGNOSIS — R109 Unspecified abdominal pain: Secondary | ICD-10-CM | POA: Diagnosis present

## 2022-11-29 NOTE — ED Provider Notes (Signed)
[facility] Provider Note    Event Date/Time   First MD Initiated Contact with Patient 11/29/22 [phone]     (approximate)   History   Abdominal Pain   HPI  Donald Le is a 72 y.o. male with history of hypertension, hyperlipidemia, seizure disorder, here with near syncopal episode.  The patient is currently here with his significant other.  He states he had not really eaten or drank much today and was waiting with her throughout the day.  He states that while waiting, he began to feel some transient chest discomfort and abdominal discomfort.  He felt nauseous.  He felt mildly lightheaded.  He laid down on the ground and his symptoms have now resolved.  However, he told nursing and was subsequently checked in the ED.  He states he now feels back to his baseline.  He states he gets these episodes intermittently.  He has also had "seizure-like activity" with these episodes in the past that he was told was not epileptic.  He now feels back to his baseline.  Denies any recent fevers or chills.  Denies any chest pain.     Physical Exam   Triage Vital Signs: ED Triage Vitals  Enc Vitals Group     BP 11/29/22 0318 98/66     Pulse Rate 11/29/22 0318 62     Resp 11/29/22 0318 18     Temp 11/29/22 0318 98.4 F (36.9 C)     Temp Source 11/29/22 0318 Oral     SpO2 11/29/22 0318 100 %     Weight 11/29/22 0316 210 lb (95.3 kg)     Height 11/29/22 0316 5\' 10"  (1.778 m)     Head Circumference --      Peak Flow --      Pain Score 11/29/22 0316 1     Pain Loc --      Pain Edu? --      Excl. in GC? --     Most recent vital signs: Vitals:   11/29/22 0318 11/29/22 0425  BP: 98/66 129/81  Pulse: 62 76  Resp: 18 17  Temp: 98.4 F (36.9 C)   SpO2: 100% 100%     General: Awake, no distress.  CV:  Good peripheral perfusion.  Regular rate and rhythm.  No appreciable murmurs. Resp:  Normal work of breathing.  Lungs clear to auscultation bilaterally. Abd:  No  distention.  No tenderness.  Specifically, no right upper quadrant or epigastric tenderness.  No rebound or guarding. Other:  Alert, oriented.  No focal neurological deficits.   ED Results / Procedures / Treatments   Labs (all labs ordered are listed, but only abnormal results are displayed) Labs Reviewed - No data to display   EKG Sinus bradycardia, ventricular rate 53.  PR 204, QRS 84, QTc 399.  No acute ST elevations or depressions.  No ischemia or infarct.   RADIOLOGY    I also independently reviewed and agree with radiologist interpretations.   PROCEDURES:  Critical Care performed: No   MEDICATIONS ORDERED IN ED: Medications - No data to display   IMPRESSION / MDM / ASSESSMENT AND PLAN / ED COURSE  I reviewed the triage vital signs and the nursing notes.                              Differential diagnosis includes, but is not limited to, vasovagal episode, transient abdominal discomfort/cramping,  ACS, gastritis, GERD, pancreatitis, near syncope  Patient's presentation is most consistent with acute presentation with potential threat to life or bodily function.  The patient is on the cardiac monitor to evaluate for evidence of arrhythmia and/or significant heart rate changes   72 year old male here with transient abdominal discomfort and near syncopal episode, now resolved.  Patient is hemodynamically stable and asymptomatic now.  EKG is nonischemic.  Has had a history of similar symptoms.  He states he feels like this is due to not eating or drinking throughout the day and he now feels back to his baseline.  He is adamant that he has had no other chest pain, shortness of breath, or other symptoms.  He declines further lab evaluation at this time.  As mentioned, EKG is nonischemic and he has had no ectopy on telemetry.  He has a history of recurrent episodes with similar presentations.  His symptoms do seem somewhat vasovagal in nature.  I initially recommended lab work  but given that he is back to his baseline and would like to go home, will discharge with good return precautions.   FINAL CLINICAL IMPRESSION(S) / ED DIAGNOSES   Final diagnoses:  Epigastric pain     Rx / DC Orders   ED Discharge Orders     None        Note:  This document was prepared using Dragon voice recognition software and may include unintentional dictation errors.   Shaune Pollack, MD 11/29/22 [phone]

## 2022-11-29 NOTE — ED Triage Notes (Signed)
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 Pt was diaphoretic and laid on the floor for the "cooling sensation" on his stomach. Pt states the pain has passed but he said the pain came on suddenly and very intense. A&Ox4 at this time. Denies dizziness, N/V/D, CP or SOB.

## 2022-12-06 ENCOUNTER — Telehealth: Payer: Self-pay | Admitting: *Deleted

## 2022-12-06 NOTE — Telephone Encounter (Signed)
Transition Care Management Unsuccessful Follow-up Telephone Call  Date of discharge and from where:  [facility] 11/29/2022  Attempts:  1st Attempt  Reason for unsuccessful TCM follow-up call:  No answer/busy

## 2022-12-06 NOTE — Telephone Encounter (Signed)
Transition Care Management Unsuccessful Follow-up Telephone Call  Date of discharge and from where:  [facility]  11/29/2022  Attempts:  2nd Attempt  Reason for unsuccessful TCM follow-up call:  No answer/busy

## 2022-12-07 ENCOUNTER — Other Ambulatory Visit: Payer: Self-pay | Admitting: Family Medicine

## 2022-12-07 DIAGNOSIS — E782 Mixed hyperlipidemia: Secondary | ICD-10-CM

## 2022-12-07 NOTE — Telephone Encounter (Signed)
Requested Prescriptions  Refused Prescriptions Disp Refills   atorvastatin (LIPITOR) 40 MG tablet [Pharmacy Med Name: ATORVASTATIN 40 MG TABLET] 45 tablet 0    Sig: TAKE 0.5 TABLETS (20 MG TOTAL) BY MOUTH DAILY. 1/2 TABLET DAILY     Cardiovascular:  Antilipid - Statins Failed - 12/07/2022  2:21 AM      Failed - Lipid Panel in normal range within the last 12 months    Cholesterol, Total  Date Value Ref Range Status  10/31/2022 139 100 - 199 mg/dL Final   Cholesterol Piccolo, Waived  Date Value Ref Range Status  09/20/2016 206 (H) <200 mg/dL Final    Comment:                            Desirable                <200                         Borderline High      200- 239                         High                     >239    LDL Chol Calc (NIH)  Date Value Ref Range Status  10/31/2022 73 0 - 99 mg/dL Final   HDL  Date Value Ref Range Status  10/31/2022 48 >39 mg/dL Final   Triglycerides  Date Value Ref Range Status  10/31/2022 98 0 - 149 mg/dL Final   Triglycerides Piccolo,Waived  Date Value Ref Range Status  09/20/2016 122 <150 mg/dL Final    Comment:                            Normal                   <150                         Borderline High     150 - 199                         High                200 - 499                         Very High                >499          Passed - Patient is not pregnant      Passed - Valid encounter within last 12 months    Recent Outpatient Visits           1 month ago Type 2 diabetes mellitus with microalbuminuria, without long-term current use of insulin (HCC)   [place] [facility] [place], Megan P, DO   4 months ago Type 2 diabetes mellitus with microalbuminuria, without long-term current use of insulin (HCC)   [place] [facility] [place], Megan P, DO   7 months ago Routine general medical examination at a health care facility   [facility] [place], Megan P, DO  12 months ago Hypertension associated with diabetes [facility])   [place] [facility] Gabriel Cirri, NP   1 year ago Type 2 diabetes mellitus with hyperlipidemia [facility])   [place] [facility] Vigg, Avanti, MD       Future Appointments             In 1 month Johnson, Oralia Rud, DO [place] [facility]   In 9 months Vanna Scotland, MD [facility]

## 2022-12-17 ENCOUNTER — Telehealth: Payer: Self-pay | Admitting: Family Medicine

## 2022-12-17 DIAGNOSIS — G8929 Other chronic pain: Secondary | ICD-10-CM

## 2022-12-17 NOTE — Telephone Encounter (Signed)
Copied from CRM [id number]. Topic: Referral - Request for Referral >> Dec 17, 2022  8:09 AM Everette C wrote: Has patient seen PCP for this complaint? Yes.   *If NO, is insurance requiring patient see PCP for this issue before PCP can refer them? Referral for which specialty: Orthopedic Specialist  Preferred provider/office: patient has no preference  Reason for referral: right knee discomfort

## 2022-12-17 NOTE — Telephone Encounter (Signed)
Patient notified

## 2022-12-19 DIAGNOSIS — M1711 Unilateral primary osteoarthritis, right knee: Secondary | ICD-10-CM | POA: Diagnosis not present

## 2023-01-04 DIAGNOSIS — H401133 Primary open-angle glaucoma, bilateral, severe stage: Secondary | ICD-10-CM | POA: Diagnosis not present

## 2023-01-04 DIAGNOSIS — H2513 Age-related nuclear cataract, bilateral: Secondary | ICD-10-CM | POA: Diagnosis not present

## 2023-01-31 ENCOUNTER — Ambulatory Visit (INDEPENDENT_AMBULATORY_CARE_PROVIDER_SITE_OTHER): Payer: Medicare PPO | Admitting: Physician Assistant

## 2023-01-31 ENCOUNTER — Encounter: Payer: Self-pay | Admitting: Physician Assistant

## 2023-01-31 VITALS — BP 126/76 | HR 61 | Ht 70.0 in | Wt 204.0 lb

## 2023-01-31 DIAGNOSIS — E785 Hyperlipidemia, unspecified: Secondary | ICD-10-CM

## 2023-01-31 DIAGNOSIS — E1159 Type 2 diabetes mellitus with other circulatory complications: Secondary | ICD-10-CM

## 2023-01-31 DIAGNOSIS — I152 Hypertension secondary to endocrine disorders: Secondary | ICD-10-CM

## 2023-01-31 DIAGNOSIS — Z23 Encounter for immunization: Secondary | ICD-10-CM | POA: Diagnosis not present

## 2023-01-31 DIAGNOSIS — E1169 Type 2 diabetes mellitus with other specified complication: Secondary | ICD-10-CM | POA: Diagnosis not present

## 2023-01-31 DIAGNOSIS — E1129 Type 2 diabetes mellitus with other diabetic kidney complication: Secondary | ICD-10-CM | POA: Diagnosis not present

## 2023-01-31 DIAGNOSIS — R809 Proteinuria, unspecified: Secondary | ICD-10-CM | POA: Diagnosis not present

## 2023-01-31 DIAGNOSIS — Z7984 Long term (current) use of oral hypoglycemic drugs: Secondary | ICD-10-CM | POA: Diagnosis not present

## 2023-01-31 DIAGNOSIS — E039 Hypothyroidism, unspecified: Secondary | ICD-10-CM

## 2023-01-31 LAB — BAYER DCA HB A1C WAIVED: HB A1C (BAYER DCA - WAIVED): 6.9 % — ABNORMAL HIGH (ref 4.8–5.6)

## 2023-01-31 NOTE — Assessment & Plan Note (Signed)
Chronic, historic condition Appears well-managed on current regimen comprised of amlodipine 10 mg p.o. daily, HCTZ 25 mg p.o. daily, losartan 100 mg p.o. daily He reports home blood pressure measurements are in goal ranges and denies side effects of current regimen Continue current regimen Follow-up in 3 months or sooner if concerns arise

## 2023-01-31 NOTE — Assessment & Plan Note (Signed)
Chronic, historic condition Appears well-managed with current regimen of metformin 500 mg p.o. twice daily He states that he has not been able to check his sugars at home regularly due to the glucose meter issues but he thinks it is working now He is up-to-date on eye and foot exams Will recheck A1c today-results to dictate further management For now continue current regimen Follow-up in 3 months or sooner if concerns arise

## 2023-01-31 NOTE — Assessment & Plan Note (Signed)
Chronic, historic conditions Appears well-managed on current regimen comprised of atorvastatin 20 mg p.o. daily The 10-year ASCVD risk score (Arnett DK, et al., 2019) is: 30.6%   Values used to calculate the score:     Age: 72 years     Sex: Male     Is Non-Hispanic African American: Yes     Diabetic: Yes     Tobacco smoker: No     Systolic Blood Pressure: 126 mmHg     Is BP treated: Yes     HDL Cholesterol: 48 mg/dL     Total Cholesterol: 139 mg/dL Continue current regimen Recommend recheck of lipid panel at follow-up Follow-up in 3 months or sooner if concerns arise

## 2023-01-31 NOTE — Assessment & Plan Note (Signed)
Chronic, historic condition Last TSH was 3.080  Patient is currently taking levothyroxine 50 mcg p.o. daily and appears to be tolerating well Continue current regimen Recheck TSH and T4 at next follow-up Follow-up in 3 months or sooner if concerns arise
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
## 2023-01-31 NOTE — Progress Notes (Signed)
Your A1c was 6.9. This is great and still within goal so we do not need to change any of your medications today.  Please continue to take your metformin as directed, stay active and watch your sugar intake

## 2023-02-05 ENCOUNTER — Other Ambulatory Visit: Payer: Self-pay | Admitting: Family Medicine

## 2023-02-05 DIAGNOSIS — E1169 Type 2 diabetes mellitus with other specified complication: Secondary | ICD-10-CM

## 2023-02-07 NOTE — Telephone Encounter (Signed)
Unable to refill per protocol, Rx request is too soon. Last refill 10/31/22 for 90 and 1 refill.  Requested Prescriptions  Pending Prescriptions Disp Refills   atorvastatin (LIPITOR) 20 MG tablet [Pharmacy Med Name: ATORVASTATIN 20 MG TABLET] 90 tablet 1    Sig: TAKE 1 TABLET BY MOUTH EVERY DAY     Cardiovascular:  Antilipid - Statins Failed - 02/05/2023  4:32 PM      Failed - Lipid Panel in normal range within the last 12 months    Cholesterol, Total  Date Value Ref Range Status  10/31/2022 139 100 - 199 mg/dL Final   Cholesterol Piccolo, Waived  Date Value Ref Range Status  09/20/2016 206 (H) <200 mg/dL Final    Comment:                            Desirable                <200                         Borderline High      200- 239                         High                     >239    LDL Chol Calc (NIH)  Date Value Ref Range Status  10/31/2022 73 0 - 99 mg/dL Final   HDL  Date Value Ref Range Status  10/31/2022 48 >39 mg/dL Final   Triglycerides  Date Value Ref Range Status  10/31/2022 98 0 - 149 mg/dL Final   Triglycerides Piccolo,Waived  Date Value Ref Range Status  09/20/2016 122 <150 mg/dL Final    Comment:                            Normal                   <150                         Borderline High     150 - 199                         High                200 - 499                         Very High                >499          Passed - Patient is not pregnant      Passed - Valid encounter within last 12 months    Recent Outpatient Visits           1 week ago Type 2 diabetes mellitus with diabetic microalbuminuria, without long-term current use of insulin (HCC)   [place] [facility] Mecum, Erin E, PA-C   3 months ago Type 2 diabetes mellitus with microalbuminuria, without long-term current use of insulin (HCC)   [place] [facility] [place], Megan P, DO   6 months ago Type 2 diabetes mellitus with microalbuminuria,  without long-term current use of insulin (HCC)   [place] [facility] [place], Megan P, DO   9 months ago Routine general medical examination at a health care facility   [facility] [place], [place] P, DO   1 year ago Hypertension associated with diabetes [facility])   [place] [facility] Gabriel Cirri, NP       Future Appointments             In 7 months Vanna Scotland, MD [facility]

## 2023-02-27 ENCOUNTER — Telehealth: Payer: Self-pay | Admitting: Family Medicine

## 2023-02-27 ENCOUNTER — Other Ambulatory Visit: Payer: Self-pay | Admitting: Family Medicine

## 2023-02-27 DIAGNOSIS — E1169 Type 2 diabetes mellitus with other specified complication: Secondary | ICD-10-CM

## 2023-02-27 NOTE — Telephone Encounter (Signed)
Medication Refill - Medication: atorvastatin (LIPITOR) 20 MG tablet [[id number]]   Has the patient contacted their pharmacy? Yes.     (Agent: If yes, when and what did the pharmacy advise?) Contact PCP no refills   Preferred Pharmacy (with phone number or street name): [facility] [address] [place]   Has the patient been seen for an appointment in the last year OR does the patient have an upcoming appointment? Yes.    Agent: Please be advised that RX refills may take up to 3 business days. We ask that you follow-up with your pharmacy.

## 2023-02-27 NOTE — Telephone Encounter (Signed)
Already refilled today in a separate refill encounter.

## 2023-02-27 NOTE — Telephone Encounter (Signed)
Requested Prescriptions  Pending Prescriptions Disp Refills   atorvastatin (LIPITOR) 20 MG tablet [Pharmacy Med Name: ATORVASTATIN 20 MG TABLET] 90 tablet 0    Sig: TAKE 1 TABLET BY MOUTH EVERY DAY     Cardiovascular:  Antilipid - Statins Failed - 02/27/2023  9:28 AM      Failed - Lipid Panel in normal range within the last 12 months    Cholesterol, Total  Date Value Ref Range Status  10/31/2022 139 100 - 199 mg/dL Final   Cholesterol Piccolo, Waived  Date Value Ref Range Status  09/20/2016 206 (H) <200 mg/dL Final    Comment:                            Desirable                <200                         Borderline High      200- 239                         High                     >239    LDL Chol Calc (NIH)  Date Value Ref Range Status  10/31/2022 73 0 - 99 mg/dL Final   HDL  Date Value Ref Range Status  10/31/2022 48 >39 mg/dL Final   Triglycerides  Date Value Ref Range Status  10/31/2022 98 0 - 149 mg/dL Final   Triglycerides Piccolo,Waived  Date Value Ref Range Status  09/20/2016 122 <150 mg/dL Final    Comment:                            Normal                   <150                         Borderline High     150 - 199                         High                200 - 499                         Very High                >499          Passed - Patient is not pregnant      Passed - Valid encounter within last 12 months    Recent Outpatient Visits           3 weeks ago Type 2 diabetes mellitus with diabetic microalbuminuria, without long-term current use of insulin (HCC)   [place] [facility] Mecum, Erin E, PA-C   3 months ago Type 2 diabetes mellitus with microalbuminuria, without long-term current use of insulin (HCC)   [place] [facility] [place], Megan P, DO   7 months ago Type 2 diabetes mellitus with microalbuminuria, without long-term current use of insulin [facility])    [facility] [place], [place], DO  10 months ago Routine general medical examination at a health care facility   [facility] [place], [place] P, DO   1 year ago Hypertension associated with diabetes [facility])   [place] [facility] Gabriel Cirri, NP       Future Appointments             In 7 months Vanna Scotland, MD [facility]

## 2023-03-08 ENCOUNTER — Other Ambulatory Visit: Payer: Self-pay | Admitting: Family Medicine

## 2023-03-08 NOTE — Telephone Encounter (Signed)
Requested Prescriptions  Pending Prescriptions Disp Refills   losartan (COZAAR) 100 MG tablet [Pharmacy Med Name: LOSARTAN POTASSIUM 100 MG TAB] 90 tablet 1    Sig: TAKE 1 TABLET BY MOUTH EVERY DAY     Cardiovascular:  Angiotensin Receptor Blockers Failed - 03/08/2023  2:29 AM      Failed - Cr in normal range and within 180 days    Creatinine, Ser  Date Value Ref Range Status  10/31/2022 1.33 (H) 0.76 - 1.27 mg/dL Final         Passed - K in normal range and within 180 days    Potassium  Date Value Ref Range Status  10/31/2022 4.0 3.5 - 5.2 mmol/L Final         Passed - Patient is not pregnant      Passed - Last BP in normal range    BP Readings from Last 1 Encounters:  01/31/23 126/76         Passed - Valid encounter within last 6 months    Recent Outpatient Visits           1 month ago Type 2 diabetes mellitus with diabetic microalbuminuria, without long-term current use of insulin (HCC)   [place] [facility] Mecum, Erin E, PA-C   4 months ago Type 2 diabetes mellitus with microalbuminuria, without long-term current use of insulin (HCC)   [place] [facility] [place], Megan P, DO   7 months ago Type 2 diabetes mellitus with microalbuminuria, without long-term current use of insulin (HCC)   [place] [facility] [place], Megan P, DO   10 months ago Routine general medical examination at a health care facility   [facility] [place], [place] P, DO   1 year ago Hypertension associated with diabetes [facility])   [place] [facility] Gabriel Cirri, NP       Future Appointments             In 6 months Vanna Scotland, MD [facility] [place]             hydrochlorothiazide (HYDRODIURIL) 25 MG tablet [Pharmacy Med Name: HYDROCHLOROTHIAZIDE 25 MG TAB] 90 tablet 1    Sig: TAKE 1 TABLET (25 MG TOTAL) BY MOUTH DAILY.     Cardiovascular: Diuretics - Thiazide Failed - 03/08/2023   2:29 AM      Failed - Cr in normal range and within 180 days    Creatinine, Ser  Date Value Ref Range Status  10/31/2022 1.33 (H) 0.76 - 1.27 mg/dL Final         Passed - K in normal range and within 180 days    Potassium  Date Value Ref Range Status  10/31/2022 4.0 3.5 - 5.2 mmol/L Final         Passed - Na in normal range and within 180 days    Sodium  Date Value Ref Range Status  10/31/2022 139 134 - 144 mmol/L Final         Passed - Last BP in normal range    BP Readings from Last 1 Encounters:  01/31/23 126/76         Passed - Valid encounter within last 6 months    Recent Outpatient Visits           1 month ago Type 2 diabetes mellitus with diabetic microalbuminuria, without long-term current use of insulin (HCC)   [place] [facility] Mecum, Denny [place]  E, PA-C   4 months ago Type 2 diabetes mellitus with microalbuminuria, without long-term current use of insulin (HCC)   [place] [facility], Megan P, DO   7 months ago Type 2 diabetes mellitus with microalbuminuria, without long-term current use of insulin (HCC)   [place] [facility] [place], Megan P, DO   10 months ago Routine general medical examination at a health care facility   [facility] [place], [place] P, DO   1 year ago Hypertension associated with diabetes [facility])   [place] [facility] Gabriel Cirri, NP       Future Appointments             In 6 months Vanna Scotland, MD [facility]

## 2023-03-22 DIAGNOSIS — M1711 Unilateral primary osteoarthritis, right knee: Secondary | ICD-10-CM | POA: Diagnosis not present

## 2023-04-08 DIAGNOSIS — M1711 Unilateral primary osteoarthritis, right knee: Secondary | ICD-10-CM | POA: Diagnosis not present

## 2023-04-22 DIAGNOSIS — M1711 Unilateral primary osteoarthritis, right knee: Secondary | ICD-10-CM | POA: Diagnosis not present

## 2023-04-29 DIAGNOSIS — M1711 Unilateral primary osteoarthritis, right knee: Secondary | ICD-10-CM | POA: Diagnosis not present

## 2023-05-06 DIAGNOSIS — M1711 Unilateral primary osteoarthritis, right knee: Secondary | ICD-10-CM | POA: Diagnosis not present

## 2023-05-30 DIAGNOSIS — S86911A Strain of unspecified muscle(s) and tendon(s) at lower leg level, right leg, initial encounter: Secondary | ICD-10-CM | POA: Diagnosis not present

## 2023-06-06 ENCOUNTER — Other Ambulatory Visit: Payer: Self-pay | Admitting: Family Medicine

## 2023-06-09 ENCOUNTER — Other Ambulatory Visit: Payer: Self-pay | Admitting: Family Medicine

## 2023-06-10 NOTE — Telephone Encounter (Signed)
 Requested Prescriptions  Pending Prescriptions Disp Refills   amLODipine  (NORVASC ) 10 MG tablet [Pharmacy Med Name: AMLODIPINE  BESYLATE 10 MG TAB] 90 tablet 0    Sig: TAKE 1 TABLET BY MOUTH EVERY DAY     Cardiovascular: Calcium  Channel Blockers 2 Passed - 06/10/2023 10:04 AM      Passed - Last BP in normal range    BP Readings from Last 1 Encounters:  01/31/23 126/76         Passed - Last Heart Rate in normal range    Pulse Readings from Last 1 Encounters:  01/31/23 61         Passed - Valid encounter within last 6 months    Recent Outpatient Visits           4 months ago Type 2 diabetes mellitus with diabetic microalbuminuria, without long-term current use of insulin  (HCC)   [place] [facility] Mecum, Erin E, PA-C   7 months ago Type 2 diabetes mellitus with microalbuminuria, without long-term current use of insulin  (HCC)   [place] [facility] [place], Megan P, DO   10 months ago Type 2 diabetes mellitus with microalbuminuria, without long-term current use of insulin  [facility])   [place] [facility] [place], Megan P, DO   1 year ago Routine general medical examination at a health care facility   [facility] [place], [place] P, DO   1 year ago Hypertension associated with diabetes [facility])   [place] [facility] Daphane Rosella, NP       Future Appointments             In 3 months Penne Knee, MD [facility] 

## 2023-06-11 NOTE — Telephone Encounter (Signed)
 Requested Prescriptions  Pending Prescriptions Disp Refills   metFORMIN  (GLUCOPHAGE ) 500 MG tablet [Pharmacy Med Name: METFORMIN  HCL 500 MG TABLET] 180 tablet 1    Sig: TAKE 1 TABLET BY MOUTH 2 TIMES DAILY WITH A MEAL.     Endocrinology:  Diabetes - Biguanides Failed - 06/11/2023 10:23 AM      Failed - Cr in normal range and within 360 days    Creatinine, Ser  Date Value Ref Range Status  10/31/2022 1.33 (H) 0.76 - 1.27 mg/dL Final         Failed - eGFR in normal range and within 360 days    GFR calc Af Amer  Date Value Ref Range Status  06/27/2020 68 >59 mL/min/1.73 Final    Comment:    **In accordance with recommendations from the NKF-ASN Task force,**   [facility] is in the process of updating its eGFR calculation to the   2021 CKD-EPI creatinine equation that estimates kidney function   without a race variable.    GFR, Estimated  Date Value Ref Range Status  10/09/2021 58 (L) >60 mL/min Final    Comment:    (NOTE) Calculated using the CKD-EPI Creatinine Equation (2021)    eGFR  Date Value Ref Range Status  10/31/2022 58 (L) >59 mL/min/1.73 Final         Passed - HBA1C is between 0 and 7.9 and within 180 days    HB A1C (BAYER DCA - WAIVED)  Date Value Ref Range Status  01/31/2023 6.9 (H) 4.8 - 5.6 % Final    Comment:             Prediabetes: 5.7 - 6.4          Diabetes: >6.4          Glycemic control for adults with diabetes: <7.0          Passed - B12 Level in normal range and within 720 days    Vitamin B-12  Date Value Ref Range Status  09/12/2021 619 232 - 1,245 pg/mL Final         Passed - Valid encounter within last 6 months    Recent Outpatient Visits           4 months ago Type 2 diabetes mellitus with diabetic microalbuminuria, without long-term current use of insulin  (HCC)   [facility] M[place], Erin E, PA-C   7 months ago Type 2 diabetes mellitus with microalbuminuria, without long-term current use of insulin  (HCC)   [facility] [place], Megan P, DO   10 months ago Type 2 diabetes mellitus with microalbuminuria, without long-term current use of insulin  (HCC)   [facility] [place], Megan P, DO   1 year ago Routine general medical examination at a health care facility   [facility] [place], [place] P, DO   1 year ago Hypertension associated with diabetes [facility])    [facility] Daphane Rosella, NP       Future Appointments             In 3 months Penne Knee, MD [facility] [place]            Passed - CBC within normal limits and completed in the last 12 months    WBC  Date Value Ref Range Status  10/31/2022 3.5 3.4 - 10.8 x10E3/uL Final  10/09/2021 8.6 4.0 - 10.5 K/uL Final   RBC  Date Value Ref Range Status  10/31/2022 5.87 (H) 4.14 - 5.80 x10E6/uL Final  10/09/2021 4.84 4.22 - 5.81 MIL/uL Final   Hemoglobin  Date Value Ref Range Status  10/31/2022 15.6 13.0 - 17.7 g/dL Final   Hematocrit  Date Value Ref Range Status  10/31/2022 49.1 37.5 - 51.0 % Final   MCHC  Date Value Ref Range Status  10/31/2022 31.8 31.5 - 35.7 g/dL Final  94/84/7976 66.6 30.0 - 36.0 g/dL Final   [facility]  Date Value Ref Range Status  10/31/2022 26.6 26.6 - 33.0 pg Final  10/09/2021 26.7 26.0 - 34.0 pg Final   MCV  Date Value Ref Range Status  10/31/2022 84 79 - 97 fL Final   No results found for: PLTCOUNTKUC, LABPLAT, POCPLA RDW  Date Value Ref Range Status  10/31/2022 15.6 (H) 11.6 - 15.4 % Final

## 2023-06-25 ENCOUNTER — Ambulatory Visit: Payer: Medicare PPO | Admitting: Family Medicine

## 2023-06-25 ENCOUNTER — Encounter: Payer: Self-pay | Admitting: Family Medicine

## 2023-06-25 VITALS — BP 129/80 | HR 67 | Ht 69.0 in | Wt 202.4 lb

## 2023-06-25 DIAGNOSIS — E039 Hypothyroidism, unspecified: Secondary | ICD-10-CM | POA: Diagnosis not present

## 2023-06-25 DIAGNOSIS — N400 Enlarged prostate without lower urinary tract symptoms: Secondary | ICD-10-CM

## 2023-06-25 DIAGNOSIS — R809 Proteinuria, unspecified: Secondary | ICD-10-CM | POA: Diagnosis not present

## 2023-06-25 DIAGNOSIS — E1169 Type 2 diabetes mellitus with other specified complication: Secondary | ICD-10-CM

## 2023-06-25 DIAGNOSIS — R972 Elevated prostate specific antigen [PSA]: Secondary | ICD-10-CM | POA: Diagnosis not present

## 2023-06-25 DIAGNOSIS — E1129 Type 2 diabetes mellitus with other diabetic kidney complication: Secondary | ICD-10-CM

## 2023-06-25 DIAGNOSIS — M1991 Primary osteoarthritis, unspecified site: Secondary | ICD-10-CM | POA: Insufficient documentation

## 2023-06-25 DIAGNOSIS — Z0000 Encounter for general adult medical examination without abnormal findings: Secondary | ICD-10-CM | POA: Diagnosis not present

## 2023-06-25 DIAGNOSIS — I152 Hypertension secondary to endocrine disorders: Secondary | ICD-10-CM

## 2023-06-25 DIAGNOSIS — E785 Hyperlipidemia, unspecified: Secondary | ICD-10-CM

## 2023-06-25 DIAGNOSIS — E1159 Type 2 diabetes mellitus with other circulatory complications: Secondary | ICD-10-CM | POA: Diagnosis not present

## 2023-06-25 DIAGNOSIS — R310 Gross hematuria: Secondary | ICD-10-CM

## 2023-06-25 DIAGNOSIS — R300 Dysuria: Secondary | ICD-10-CM | POA: Diagnosis not present

## 2023-06-25 LAB — MICROALBUMIN, URINE WAIVED
Creatinine, Urine Waived: 200 mg/dL (ref 10–300)
Microalb, Ur Waived: 30 mg/L — ABNORMAL HIGH (ref 0–19)
Microalb/Creat Ratio: <30 mg/g (ref <30)

## 2023-06-25 LAB — URINALYSIS, ROUTINE W REFLEX MICROSCOPIC
Bilirubin, UA: NEGATIVE
Glucose, UA: NEGATIVE
Ketones, UA: NEGATIVE
Leukocytes,UA: NEGATIVE
Nitrite, UA: NEGATIVE
Protein,UA: NEGATIVE
RBC, UA: NEGATIVE
Specific Gravity, UA: 1.02 (ref 1.005–1.030)
Urobilinogen, Ur: 1 mg/dL (ref 0.2–1.0)
pH, UA: 6 (ref 5.0–7.5)

## 2023-06-25 LAB — BAYER DCA HB A1C WAIVED: HB A1C (BAYER DCA - WAIVED): 6.6 % — ABNORMAL HIGH (ref 4.8–5.6)

## 2023-06-25 NOTE — Assessment & Plan Note (Signed)
Under good control on current regimen. Continue current regimen. Continue to monitor. Call with any concerns. Refills given. Labs drawn today.

## 2023-06-25 NOTE — Assessment & Plan Note (Signed)
Rechecking labs today. Await results. Treat as needed.

## 2023-06-25 NOTE — Assessment & Plan Note (Signed)
Doing well with A1c of 6.5. Continue current regimen. Continue to monitor. Call with any concerns.
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
## 2023-06-25 NOTE — Assessment & Plan Note (Signed)
Continue to follow with ortho. Call with any concerns. Continue to monitor.

## 2023-06-26 LAB — LIPID PANEL W/O CHOL/HDL RATIO
Cholesterol, Total: 156 mg/dL (ref 100–199)
HDL: 51 mg/dL (ref >39)
LDL Chol Calc (NIH): 89 mg/dL (ref 0–99)
Triglycerides: 82 mg/dL (ref 0–149)
VLDL Cholesterol Cal: 16 mg/dL (ref 5–40)

## 2023-06-26 LAB — COMPREHENSIVE METABOLIC PANEL
ALT: 13 [IU]/L (ref 0–44)
AST: 18 [IU]/L (ref 0–40)
Albumin: 4.4 g/dL (ref 3.8–4.8)
Alkaline Phosphatase: 67 [IU]/L (ref 44–121)
BUN/Creatinine Ratio: 11 (ref 10–24)
BUN: 13 mg/dL (ref 8–27)
Bilirubin Total: 0.4 mg/dL (ref 0.0–1.2)
CO2: 23 mmol/L (ref 20–29)
Calcium: 9.4 mg/dL (ref 8.6–10.2)
Chloride: 101 mmol/L (ref 96–106)
Creatinine, Ser: 1.18 mg/dL (ref 0.76–1.27)
Globulin, Total: 2.8 g/dL (ref 1.5–4.5)
Glucose: 105 mg/dL — ABNORMAL HIGH (ref 70–99)
Potassium: 4.1 mmol/L (ref 3.5–5.2)
Sodium: 138 mmol/L (ref 134–144)
Total Protein: 7.2 g/dL (ref 6.0–8.5)
eGFR: 66 mL/min/{1.73_m2} (ref >59)

## 2023-06-26 LAB — CBC WITH DIFFERENTIAL/PLATELET
Basophils Absolute: 0.1 10*3/uL (ref 0.0–0.2)
Basos: 1 %
EOS (ABSOLUTE): 0.2 10*3/uL (ref 0.0–0.4)
Eos: 3 %
Hematocrit: 49.3 % (ref 37.5–51.0)
Hemoglobin: 16.4 g/dL (ref 13.0–17.7)
Immature Grans (Abs): 0 10*3/uL (ref 0.0–0.1)
Immature Granulocytes: 0 %
Lymphocytes Absolute: 1.6 10*3/uL (ref 0.7–3.1)
Lymphs: 31 %
MCH: 27.5 pg (ref 26.6–33.0)
MCHC: 33.3 g/dL (ref 31.5–35.7)
MCV: 83 fL (ref 79–97)
Monocytes Absolute: 0.5 10*3/uL (ref 0.1–0.9)
Monocytes: 9 %
Neutrophils Absolute: 2.9 10*3/uL (ref 1.4–7.0)
Neutrophils: 56 %
Platelets: 205 10*3/uL (ref 150–450)
RBC: 5.96 x10E6/uL — ABNORMAL HIGH (ref 4.14–5.80)
RDW: 14.6 % (ref 11.6–15.4)
WBC: 5.1 10*3/uL (ref 3.4–10.8)

## 2023-06-26 LAB — TSH: TSH: 6.41 u[IU]/mL — ABNORMAL HIGH (ref 0.450–4.500)

## 2023-06-26 LAB — PSA: Prostate Specific Ag, Serum: 8 ng/mL — ABNORMAL HIGH (ref 0.0–4.0)

## 2023-06-27 LAB — URINE CULTURE

## 2023-07-05 DIAGNOSIS — H401133 Primary open-angle glaucoma, bilateral, severe stage: Secondary | ICD-10-CM | POA: Diagnosis not present

## 2023-07-07 ENCOUNTER — Encounter: Payer: Self-pay | Admitting: Family Medicine

## 2023-07-07 ENCOUNTER — Other Ambulatory Visit: Payer: Self-pay | Admitting: Family Medicine

## 2023-07-07 DIAGNOSIS — E039 Hypothyroidism, unspecified: Secondary | ICD-10-CM

## 2023-07-07 DIAGNOSIS — R972 Elevated prostate specific antigen [PSA]: Secondary | ICD-10-CM

## 2023-07-07 MED ORDER — LEVOTHYROXINE SODIUM 75 MCG PO TABS: 75.0000 ug | ORAL_TABLET | Freq: Every day | ORAL | 1 refills | Status: DC

## 2023-07-12 DIAGNOSIS — E119 Type 2 diabetes mellitus without complications: Secondary | ICD-10-CM | POA: Diagnosis not present

## 2023-07-12 DIAGNOSIS — H401133 Primary open-angle glaucoma, bilateral, severe stage: Secondary | ICD-10-CM | POA: Diagnosis not present

## 2023-07-12 DIAGNOSIS — H2513 Age-related nuclear cataract, bilateral: Secondary | ICD-10-CM | POA: Diagnosis not present

## 2023-07-12 LAB — HM DIABETES EYE EXAM

## 2023-08-01 ENCOUNTER — Other Ambulatory Visit: Payer: Self-pay | Admitting: Family Medicine

## 2023-08-20 ENCOUNTER — Other Ambulatory Visit

## 2023-08-20 ENCOUNTER — Other Ambulatory Visit: Payer: Medicare PPO

## 2023-08-20 DIAGNOSIS — R972 Elevated prostate specific antigen [PSA]: Secondary | ICD-10-CM

## 2023-08-20 DIAGNOSIS — E039 Hypothyroidism, unspecified: Secondary | ICD-10-CM

## 2023-08-21 ENCOUNTER — Encounter: Payer: Self-pay | Admitting: Family Medicine

## 2023-08-21 LAB — PSA: Prostate Specific Ag, Serum: 8.4 ng/mL — ABNORMAL HIGH (ref 0.0–4.0)

## 2023-08-21 LAB — TSH: TSH: 2.67 u[IU]/mL (ref 0.450–4.500)

## 2023-08-22 ENCOUNTER — Encounter: Payer: Self-pay | Admitting: Family Medicine

## 2023-08-22 ENCOUNTER — Other Ambulatory Visit: Payer: Self-pay | Admitting: Family Medicine

## 2023-08-22 MED ORDER — LEVOTHYROXINE SODIUM 75 MCG PO TABS: 75.0000 ug | ORAL_TABLET | Freq: Every day | ORAL | 3 refills | Status: DC

## 2023-09-01 ENCOUNTER — Other Ambulatory Visit: Payer: Self-pay | Admitting: Family Medicine

## 2023-09-03 NOTE — Telephone Encounter (Signed)
 Requested Prescriptions  Pending Prescriptions Disp Refills   amLODipine (NORVASC) 10 MG tablet [Pharmacy Med Name: AMLODIPINE BESYLATE 10 MG TAB] 90 tablet 0    Sig: TAKE 1 TABLET BY MOUTH EVERY DAY     Cardiovascular: Calcium Channel Blockers 2 Failed - 09/03/2023  9:06 AM      Failed - Valid encounter within last 6 months    Recent Outpatient Visits   None     Future Appointments             In 4 weeks Vanna Scotland, MD [facility] [place]   In 3 months Laural Benes, Oralia Rud, DO [facility]            Passed - Last BP in normal range    BP Readings from Last 1 Encounters:  06/25/23 129/80         Passed - Last Heart Rate in normal range    Pulse Readings from Last 1 Encounters:  06/25/23 67

## 2023-09-05 ENCOUNTER — Other Ambulatory Visit: Payer: Self-pay | Admitting: Family Medicine

## 2023-09-05 NOTE — Telephone Encounter (Signed)
 Requested Prescriptions  Pending Prescriptions Disp Refills   hydrochlorothiazide (HYDRODIURIL) 25 MG tablet [Pharmacy Med Name: HYDROCHLOROTHIAZIDE 25 MG TAB] 90 tablet 0    Sig: TAKE 1 TABLET (25 MG TOTAL) BY MOUTH DAILY.     Cardiovascular: Diuretics - Thiazide Failed - 09/05/2023  2:47 PM      Failed - Valid encounter within last 6 months    Recent Outpatient Visits   None     Future Appointments             In 3 weeks Vanna Scotland, MD [facility] [place]   In 3 months Laural Benes, Oralia Rud, DO [facility]            Passed - Cr in normal range and within 180 days    Creatinine, Ser  Date Value Ref Range Status  06/25/2023 1.18 0.76 - 1.27 mg/dL Final         Passed - K in normal range and within 180 days    Potassium  Date Value Ref Range Status  06/25/2023 4.1 3.5 - 5.2 mmol/L Final         Passed - Na in normal range and within 180 days    Sodium  Date Value Ref Range Status  06/25/2023 138 134 - 144 mmol/L Final         Passed - Last BP in normal range    BP Readings from Last 1 Encounters:  06/25/23 129/80          losartan (COZAAR) 100 MG tablet [Pharmacy Med Name: LOSARTAN POTASSIUM 100 MG TAB] 90 tablet 0    Sig: TAKE 1 TABLET BY MOUTH EVERY DAY     Cardiovascular:  Angiotensin Receptor Blockers Failed - 09/05/2023  2:47 PM      Failed - Valid encounter within last 6 months    Recent Outpatient Visits   None     Future Appointments             In 3 weeks Vanna Scotland, MD [facility]    In 3 months Dorcas Carrow, DO [facility]            Passed - Cr in normal range and within 180 days    Creatinine, Ser  Date Value Ref Range Status  06/25/2023 1.18 0.76 - 1.27 mg/dL Final         Passed - K in normal range and within 180 days    Potassium  Date Value Ref Range Status  06/25/2023 4.1 3.5 - 5.2 mmol/L Final         Passed - Patient is not  pregnant      Passed - Last BP in normal range    BP Readings from Last 1 Encounters:  06/25/23 129/80

## 2023-09-19 ENCOUNTER — Telehealth: Payer: Self-pay | Admitting: Family Medicine

## 2023-09-19 ENCOUNTER — Other Ambulatory Visit: Payer: Self-pay

## 2023-09-19 DIAGNOSIS — E1169 Type 2 diabetes mellitus with other specified complication: Secondary | ICD-10-CM

## 2023-09-19 MED ORDER — ATORVASTATIN CALCIUM 20 MG PO TABS: 20.0000 mg | ORAL_TABLET | Freq: Every day | ORAL | 1 refills | Status: DC

## 2023-09-19 NOTE — Telephone Encounter (Signed)
 Refill encounter forwarded to provider. Closing this thread.

## 2023-09-19 NOTE — Telephone Encounter (Signed)
 Copied from CRM [id number]. Topic: Clinical - Medication Question >> Sep 19, 2023 10:47 AM Rosamond Comes wrote: Reason for CRM: Patient requesting refill for atorvastatin  (LIPITOR) 20 MG tablet   Patient has not had this filled in a few months, patient has been taking the old dosage 40mg  breaking it in half.  Please call patient regarding dosage [phone]     Medication: atorvastatin  (LIPITOR) 20 MG tablet  Has the patient contacted their pharmacy? No "Patient has not gotten any lately"  Is this the correct pharmacy for this prescription? Yes If no, delete pharmacy and type the correct one.  This is the patient's preferred pharmacy:  [id number] [place], [place] - [address] [place] [address] Phone: [phone] Fax: [phone]   Has the prescription been filled recently? No  Is the patient out of the medication? Yes  Has the patient been seen for an appointment in the last year OR does the patient have an upcoming appointment? Yes  Can we respond through MyChart? No  Agent: Please be advised that Rx refills may take up to 3 business days. We ask that you follow-up with your pharmacy.

## 2023-09-27 ENCOUNTER — Other Ambulatory Visit: Payer: Self-pay

## 2023-10-02 ENCOUNTER — Ambulatory Visit: Payer: Self-pay | Admitting: Urology

## 2023-10-22 ENCOUNTER — Ambulatory Visit (INDEPENDENT_AMBULATORY_CARE_PROVIDER_SITE_OTHER): Payer: Self-pay | Admitting: Emergency Medicine

## 2023-10-22 VITALS — Ht 70.0 in | Wt 207.0 lb

## 2023-10-22 DIAGNOSIS — Z0000 Encounter for general adult medical examination without abnormal findings: Secondary | ICD-10-CM
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
## 2023-10-22 NOTE — Patient Instructions (Addendum)
 [name] , Thank you for taking time out of your busy schedule to complete your Annual Wellness Visit with me. I enjoyed our conversation and look forward to speaking with you again next year. I, as well as your care team,  appreciate your ongoing commitment to your health goals. Please review the following plan we discussed and let me know if I can assist you in the future. Your Game plan/ To Do List    Referrals:  None  Follow up Visits: Next Medicare AWV with our clinical staff: 10/27/24 @ 3:10pm (phone visit)   Have you seen your provider in the last 6 months (3 months if uncontrolled diabetes)? Yes Next Office Visit with your provider: 12/25/23 @ 10:00am with Dr. Lincoln Renshaw  Clinician Recommendations: You will be due for a tetanus vaccine in December of this year. Check with you local pharmacy to see if you insurance will pay for it.  Aim for 30 minutes of exercise or brisk walking, 6-8 glasses of water , and 5 servings of fruits and vegetables each day.       This is a list of the screening recommended for you and due dates:  Health Maintenance  Topic Date Due   Zoster (Shingles) Vaccine (1 of 2) 01/03/2002   Hemoglobin A1C  12/23/2023   Flu Shot  12/27/2023   DTaP/Tdap/Td vaccine (2 - Td or Tdap) 05/10/2024   Yearly kidney function blood test for diabetes  06/24/2024   Yearly kidney health urinalysis for diabetes  06/24/2024   Complete foot exam   06/24/2024   Eye exam for diabetics  07/11/2024   Medicare Annual Wellness Visit  10/21/2024   Colon Cancer Screening  08/30/2029   Pneumonia Vaccine  Completed   Hepatitis C Screening  Completed   HPV Vaccine  Aged Out   Meningitis B Vaccine  Aged Out   COVID-19 Vaccine  Discontinued    Advanced directives: (Copy Requested) Please bring a copy of your health care power of attorney and living will to the office to be added to your chart at your convenience. You can mail to [facility] [address] [place], [place],  [place] [address] or email to [email or web address] Advance Care Planning is important because it:  [x]  Makes sure you receive the medical care that is consistent with your values, goals, and preferences  [x]  It provides guidance to your family and loved ones and reduces their decisional burden about whether or not they are making the right decisions based on your wishes.  Follow the link provided in your after visit summary or read over the paperwork we have mailed to you to help you started getting your Advance Directives in place. If you need assistance in completing these, please reach out to us  so that we can help you!  See attachments for Preventive Care and Fall Prevention Tips.   Fall Prevention in the Home, Adult Falls can cause injuries and affect people of all ages. There are many simple things that you can do to make your home safe and to help prevent falls. If you need it, ask for help making these changes. What actions can I take to prevent falls? General information Use good lighting in all rooms. Make sure to: Replace any light bulbs that burn out. Turn on lights if it is dark and use night-lights. Keep items that you use often in easy-to-reach places. Lower the shelves around your home if needed. Move furniture so that there are clear paths around  it. Do not keep throw rugs or other things on the floor that can make you trip. If any of your floors are uneven, fix them. Add color or contrast paint or tape to clearly mark and help you see: Grab bars or handrails. First and last steps of staircases. Where the edge of each step is. If you use a ladder or stepladder: Make sure that it is fully opened. Do not climb a closed ladder. Make sure the sides of the ladder are locked in place. Have someone hold the ladder while you use it. Know where your pets are as you move through your home. What can I do in the bathroom?     Keep the floor dry. Clean up any water  that is on  the floor right away. Remove soap buildup in the bathtub or shower. Buildup makes bathtubs and showers slippery. Use non-skid mats or decals on the floor of the bathtub or shower. Attach bath mats securely with double-sided, non-slip rug tape. If you need to sit down while you are in the shower, use a non-slip stool. Install grab bars by the toilet and in the bathtub and shower. Do not use towel bars as grab bars. What can I do in the bedroom? Make sure that you have a light by your bed that is easy to reach. Do not use any sheets or blankets on your bed that hang to the floor. Have a firm bench or chair with side arms that you can use for support when you get dressed. What can I do in the kitchen? Clean up any spills right away. If you need to reach something above you, use a sturdy step stool that has a grab bar. Keep electrical cables out of the way. Do not use floor polish or wax that makes floors slippery. What can I do with my stairs? Do not leave anything on the stairs. Make sure that you have a light switch at the top and the bottom of the stairs. Have them installed if you do not have them. Make sure that there are handrails on both sides of the stairs. Fix handrails that are broken or loose. Make sure that handrails are as long as the staircases. Install non-slip stair treads on all stairs in your home if they do not have carpet. Avoid having throw rugs at the top or bottom of stairs, or secure the rugs with carpet tape to prevent them from moving. Choose a carpet design that does not hide the edge of steps on the stairs. Make sure that carpet is firmly attached to the stairs. Fix any carpet that is loose or worn. What can I do on the outside of my home? Use bright outdoor lighting. Repair the edges of walkways and driveways and fix any cracks. Clear paths of anything that can make you trip, such as tools or rocks. Add color or contrast paint or tape to clearly mark and help you  see high doorway thresholds. Trim any bushes or trees on the main path into your home. Check that handrails are securely fastened and in good repair. Both sides of all steps should have handrails. Install guardrails along the edges of any raised decks or porches. Have leaves, snow, and ice cleared regularly. Use sand, salt, or ice melt on walkways during winter months if you live where there is ice and snow. In the garage, clean up any spills right away, including grease or oil spills. What other actions can I take? Review your  medicines with your health care provider. Some medicines can make you confused or feel dizzy. This can increase your chance of falling. Wear closed-toe shoes that fit well and support your feet. Wear shoes that have rubber soles and low heels. Use a cane, walker, scooter, or crutches that help you move around if needed. Talk with your provider about other ways that you can decrease your risk of falls. This may include seeing a physical therapist to learn to do exercises to improve movement and strength. Where to find more information Centers for Disease Control and Prevention, [employer]ADI: TonerPromos.no [employer] on Aging: BaseRingTones.pl National Institute on Aging: BaseRingTones.pl Contact a health care provider if: You are afraid of falling at home. You feel weak, drowsy, or dizzy at home. You fall at home. Get help right away if you: Lose consciousness or have trouble moving after a fall. Have a fall that causes a head injury. These symptoms may be an emergency. Get help right away. Call 911. Do not wait to see if the symptoms will go away. Do not drive yourself to the hospital. This information is not intended to replace advice given to you by your health care provider. Make sure you discuss any questions you have with your health care provider. Document Revised: 01/15/2022 Document Reviewed: 01/15/2022 Elsevier Patient Education  2024 [employer].

## 2023-10-25 ENCOUNTER — Other Ambulatory Visit: Payer: Self-pay

## 2023-10-25 DIAGNOSIS — R972 Elevated prostate specific antigen [PSA]: Secondary | ICD-10-CM

## 2023-10-28 ENCOUNTER — Other Ambulatory Visit

## 2023-10-28 DIAGNOSIS — R972 Elevated prostate specific antigen [PSA]: Secondary | ICD-10-CM

## 2023-10-29 ENCOUNTER — Ambulatory Visit (INDEPENDENT_AMBULATORY_CARE_PROVIDER_SITE_OTHER): Admitting: Urology

## 2023-10-29 VITALS — BP 158/90 | HR 67 | Ht 70.0 in | Wt 202.0 lb

## 2023-10-29 DIAGNOSIS — N529 Male erectile dysfunction, unspecified: Secondary | ICD-10-CM | POA: Diagnosis not present

## 2023-10-29 DIAGNOSIS — R972 Elevated prostate specific antigen [PSA]: Secondary | ICD-10-CM | POA: Diagnosis not present

## 2023-10-29 DIAGNOSIS — N400 Enlarged prostate without lower urinary tract symptoms: Secondary | ICD-10-CM

## 2023-10-29 LAB — PSA: Prostate Specific Ag, Serum: 8.5 ng/mL — ABNORMAL HIGH (ref 0.0–4.0)

## 2023-10-29 LAB — BLADDER SCAN AMB NON-IMAGING
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
## 2023-12-01 ENCOUNTER — Other Ambulatory Visit: Payer: Self-pay | Admitting: Family Medicine

## 2023-12-03 NOTE — Telephone Encounter (Signed)
 Requested Prescriptions  Pending Prescriptions Disp Refills   amLODipine  (NORVASC ) 10 MG tablet [Pharmacy Med Name: AMLODIPINE  BESYLATE 10 MG TAB] 90 tablet 0    Sig: TAKE 1 TABLET BY MOUTH EVERY DAY     Cardiovascular: Calcium  Channel Blockers 2 Failed - 12/03/2023  2:53 PM      Failed - Last BP in normal range    BP Readings from Last 1 Encounters:  10/29/23 (!) 158/90         Passed - Last Heart Rate in normal range    Pulse Readings from Last 1 Encounters:  10/29/23 67         Passed - Valid encounter within last 6 months    Recent Outpatient Visits   None     Future Appointments             In 3 weeks Donald Donald SQUIBB, DO [facility], [facility]   In 10 months Donald Le, Donald Le [facility] [place]             hydrochlorothiazide  (HYDRODIURIL ) 25 MG tablet [Pharmacy Med Name: HYDROCHLOROTHIAZIDE  25 MG TAB] 90 tablet 0    Sig: TAKE 1 TABLET (25 MG TOTAL) BY MOUTH DAILY.     Cardiovascular: Diuretics - Thiazide Failed - 12/03/2023  2:53 PM      Failed - Last BP in normal range    BP Readings from Last 1 Encounters:  10/29/23 (!) 158/90         Passed - Cr in normal range and within 180 days    Creatinine, Ser  Date Value Ref Range Status  06/25/2023 1.18 0.76 - 1.27 mg/dL Final         Passed - K in normal range and within 180 days    Potassium  Date Value Ref Range Status  06/25/2023 4.1 3.5 - 5.2 mmol/L Final         Passed - Na in normal range and within 180 days    Sodium  Date Value Ref Range Status  06/25/2023 138 134 - 144 mmol/L Final         Passed - Valid encounter within last 6 months    Recent Outpatient Visits   None     Future Appointments             In 3 weeks Donald Le, Donald P, DO [facility], [facility]   In 10 months Donald Le, Donald Le [facility] [place]             losartan  (COZAAR ) 100 MG tablet [Pharmacy Med Name: LOSARTAN  POTASSIUM 100 MG TAB] 90  tablet 0    Sig: TAKE 1 TABLET BY MOUTH EVERY DAY     Cardiovascular:  Angiotensin Receptor Blockers Failed - 12/03/2023  2:53 PM      Failed - Last BP in normal range    BP Readings from Last 1 Encounters:  10/29/23 (!) 158/90         Passed - Cr in normal range and within 180 days    Creatinine, Ser  Date Value Ref Range Status  06/25/2023 1.18 0.76 - 1.27 mg/dL Final         Passed - K in normal range and within 180 days    Potassium  Date Value Ref Range Status  06/25/2023 4.1 3.5 - 5.2 mmol/L Final         Passed - Patient is not pregnant  Passed - Valid encounter within last 6 months    Recent Outpatient Visits   None     Future Appointments             In 3 weeks Donald, Donald SQUIBB, DO [facility], [facility]   In 10 months Donald Le, Donald Le [facility]

## 2023-12-11 ENCOUNTER — Other Ambulatory Visit: Payer: Self-pay | Admitting: Family Medicine

## 2023-12-11 IMAGING — DX DG CHEST 1V PORT
1 series · 1 of 1 positions shown · non-contrast
Comparison: 04/11/2010

CLINICAL DATA: Questionable sepsis

EXAM:
PORTABLE CHEST 1 VIEW

[chest ap]
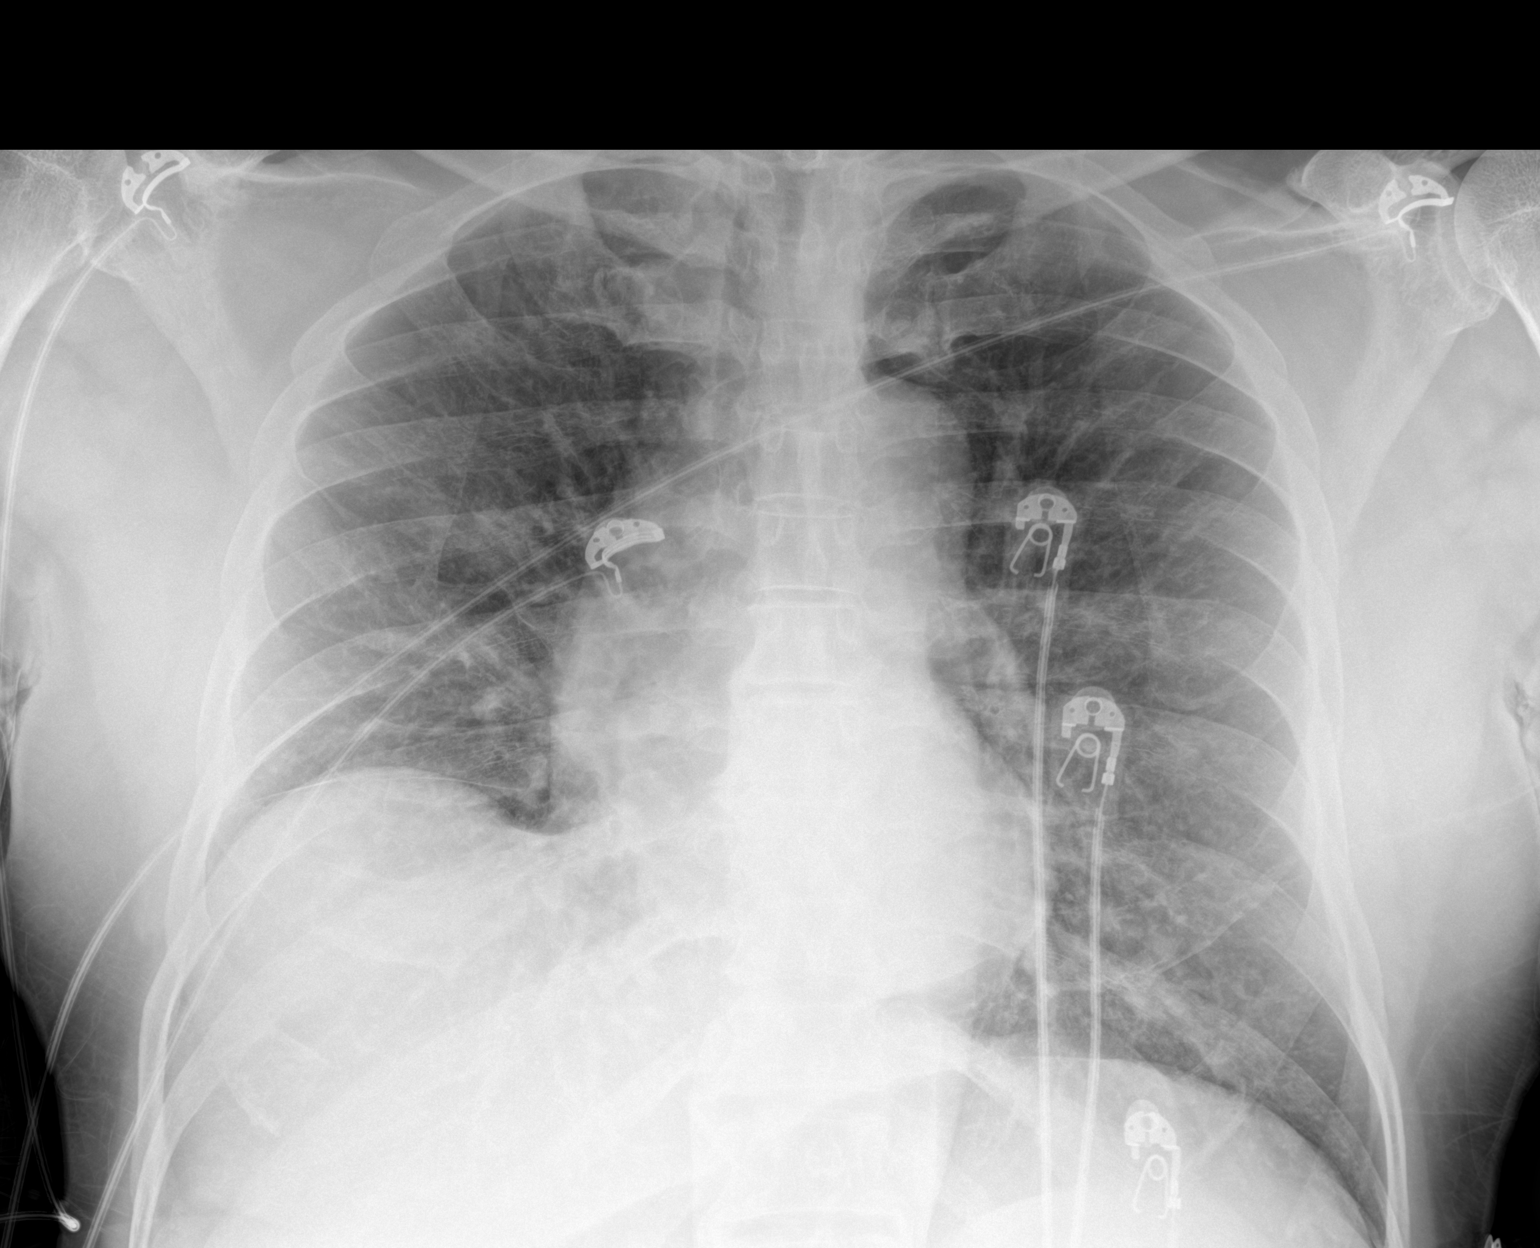

[1 of 1 positions shown; findings below may reference images not displayed]

FINDINGS: Mild elevation of the right hemidiaphragm. Heart is normal size.
Mediastinal contours are within normal limits. No confluent
opacities or effusions. No acute bony abnormality.
IMPRESSION: No active disease.

## 2023-12-12 NOTE — Telephone Encounter (Signed)
 Requested Prescriptions  Pending Prescriptions Disp Refills   levothyroxine  (SYNTHROID ) 50 MCG tablet [Pharmacy Med Name: LEVOTHYROXINE  50 MCG TABLET] 90 tablet 3    Sig: TAKE 1 TABLET BY MOUTH EVERY DAY     Endocrinology:  Hypothyroid Agents Passed - 12/12/2023  1:34 PM      Passed - TSH in normal range and within 360 days    TSH  Date Value Ref Range Status  08/20/2023 2.670 0.450 - 4.500 uIU/mL Final         Passed - Valid encounter within last 12 months    Recent Outpatient Visits   None     Future Appointments             In 1 week Vicci, Duwaine SQUIBB, DO [facility], [facility]   In 10 months McGowan, Clotilda DELENA RIGGERS [facility] [place]             metFORMIN  (GLUCOPHAGE ) 500 MG tablet [Pharmacy Med Name: METFORMIN  HCL 500 MG TABLET] 180 tablet 1    Sig: TAKE 1 TABLET BY MOUTH 2 TIMES DAILY WITH A MEAL.     Endocrinology:  Diabetes - Biguanides Failed - 12/12/2023  1:34 PM      Failed - B12 Level in normal range and within 720 days    Vitamin B-12  Date Value Ref Range Status  09/12/2021 619 232 - 1,245 pg/mL Final         Passed - Cr in normal range and within 360 days    Creatinine, Ser  Date Value Ref Range Status  06/25/2023 1.18 0.76 - 1.27 mg/dL Final         Passed - HBA1C is between 0 and 7.9 and within 180 days    HB A1C (BAYER DCA - WAIVED)  Date Value Ref Range Status  06/25/2023 6.6 (H) 4.8 - 5.6 % Final    Comment:             Prediabetes: 5.7 - 6.4          Diabetes: >6.4          Glycemic control for adults with diabetes: <7.0          Passed - eGFR in normal range and within 360 days    GFR calc Af Amer  Date Value Ref Range Status  06/27/2020 68 >59 mL/min/1.73 Final    Comment:    **In accordance with recommendations from the NKF-ASN Task force,**   [facility] is in the process of updating its eGFR calculation to the   2021 CKD-EPI creatinine equation that estimates kidney function   without a race  variable.    GFR, Estimated  Date Value Ref Range Status  10/09/2021 58 (L) >60 mL/min Final    Comment:    (NOTE) Calculated using the CKD-EPI Creatinine Equation (2021)    eGFR  Date Value Ref Range Status  06/25/2023 66 >59 mL/min/1.73 Final         Passed - Valid encounter within last 6 months    Recent Outpatient Visits   None     Future Appointments             In 1 week Vicci Duwaine SQUIBB, DO [place], [facility]   In 10 months McGowan, Clotilda DELENA RIGGERS [facility] [place]            Passed - CBC within normal limits and completed in the last 12 months  WBC  Date Value Ref Range Status  06/25/2023 5.1 3.4 - 10.8 x10E3/uL Final  10/09/2021 8.6 4.0 - 10.5 K/uL Final   RBC  Date Value Ref Range Status  06/25/2023 5.96 (H) 4.14 - 5.80 x10E6/uL Final  10/09/2021 4.84 4.22 - 5.81 MIL/uL Final   Hemoglobin  Date Value Ref Range Status  06/25/2023 16.4 13.0 - 17.7 g/dL Final   Hematocrit  Date Value Ref Range Status  06/25/2023 49.3 37.5 - 51.0 % Final   MCHC  Date Value Ref Range Status  06/25/2023 33.3 31.5 - 35.7 g/dL Final  94/84/7976 66.6 30.0 - 36.0 g/dL Final   [facility]  Date Value Ref Range Status  06/25/2023 27.5 26.6 - 33.0 pg Final  10/09/2021 26.7 26.0 - 34.0 pg Final   MCV  Date Value Ref Range Status  06/25/2023 83 79 - 97 fL Final   No results found for: PLTCOUNTKUC, LABPLAT, POCPLA RDW  Date Value Ref Range Status  06/25/2023 14.6 11.6 - 15.4 % Final

## 2023-12-25 ENCOUNTER — Ambulatory Visit: Payer: Self-pay | Admitting: Family Medicine

## 2024-01-07 DIAGNOSIS — H2513 Age-related nuclear cataract, bilateral: Secondary | ICD-10-CM | POA: Diagnosis not present

## 2024-01-07 DIAGNOSIS — H401133 Primary open-angle glaucoma, bilateral, severe stage: Secondary | ICD-10-CM | POA: Diagnosis not present

## 2024-01-07 DIAGNOSIS — E119 Type 2 diabetes mellitus without complications: Secondary | ICD-10-CM | POA: Diagnosis not present

## 2024-02-04 ENCOUNTER — Ambulatory Visit: Admitting: Family Medicine

## 2024-03-05 ENCOUNTER — Encounter: Payer: Self-pay | Admitting: Family Medicine

## 2024-03-05 ENCOUNTER — Ambulatory Visit (INDEPENDENT_AMBULATORY_CARE_PROVIDER_SITE_OTHER): Admitting: Family Medicine

## 2024-03-05 VITALS — BP 132/87 | HR 72 | Temp 97.6°F | Ht 70.0 in | Wt 196.0 lb

## 2024-03-05 DIAGNOSIS — S41101A Unspecified open wound of right upper arm, initial encounter: Secondary | ICD-10-CM

## 2024-03-05 DIAGNOSIS — Z7984 Long term (current) use of oral hypoglycemic drugs: Secondary | ICD-10-CM

## 2024-03-05 DIAGNOSIS — R972 Elevated prostate specific antigen [PSA]: Secondary | ICD-10-CM | POA: Diagnosis not present

## 2024-03-05 DIAGNOSIS — E039 Hypothyroidism, unspecified: Secondary | ICD-10-CM | POA: Diagnosis not present

## 2024-03-05 DIAGNOSIS — R809 Proteinuria, unspecified: Secondary | ICD-10-CM | POA: Diagnosis not present

## 2024-03-05 DIAGNOSIS — E1169 Type 2 diabetes mellitus with other specified complication: Secondary | ICD-10-CM

## 2024-03-05 DIAGNOSIS — Z0000 Encounter for general adult medical examination without abnormal findings: Secondary | ICD-10-CM | POA: Diagnosis not present

## 2024-03-05 DIAGNOSIS — E1159 Type 2 diabetes mellitus with other circulatory complications: Secondary | ICD-10-CM | POA: Diagnosis not present

## 2024-03-05 DIAGNOSIS — E1129 Type 2 diabetes mellitus with other diabetic kidney complication: Secondary | ICD-10-CM

## 2024-03-05 DIAGNOSIS — I152 Hypertension secondary to endocrine disorders: Secondary | ICD-10-CM

## 2024-03-05 DIAGNOSIS — Z23 Encounter for immunization: Secondary | ICD-10-CM | POA: Diagnosis not present

## 2024-03-05 DIAGNOSIS — E785 Hyperlipidemia, unspecified: Secondary | ICD-10-CM | POA: Diagnosis not present

## 2024-03-05 LAB — MICROALBUMIN, URINE WAIVED
Creatinine, Urine Waived: 100 mg/dL (ref 10–300)
Microalb, Ur Waived: 30 mg/L — ABNORMAL HIGH (ref 0–19)
Microalb/Creat Ratio: <30 mg/g (ref <30)

## 2024-03-05 LAB — BAYER DCA HB A1C WAIVED: HB A1C (BAYER DCA - WAIVED): 6.3 % — ABNORMAL HIGH (ref 4.8–5.6)

## 2024-03-05 MED ORDER — LOSARTAN POTASSIUM 100 MG PO TABS: 100.0000 mg | ORAL_TABLET | Freq: Every day | ORAL | 1 refills | Status: AC

## 2024-03-05 MED ORDER — METFORMIN HCL 500 MG PO TABS: 500.0000 mg | ORAL_TABLET | Freq: Two times a day (BID) | ORAL | 1 refills | Status: AC

## 2024-03-05 MED ORDER — HYDROCHLOROTHIAZIDE 25 MG PO TABS: 25.0000 mg | ORAL_TABLET | Freq: Every day | ORAL | 1 refills | Status: DC

## 2024-03-05 MED ORDER — AMLODIPINE BESYLATE 10 MG PO TABS: 10.0000 mg | ORAL_TABLET | Freq: Every day | ORAL | 1 refills | Status: DC
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
## 2024-03-06 ENCOUNTER — Ambulatory Visit: Payer: Self-pay | Admitting: Family Medicine

## 2024-03-06 ENCOUNTER — Encounter: Payer: Self-pay | Admitting: Family Medicine

## 2024-03-06 LAB — CBC WITH DIFFERENTIAL/PLATELET
Basophils Absolute: 0 x10E3/uL (ref 0.0–0.2)
Basos: 1 %
EOS (ABSOLUTE): 0.2 x10E3/uL (ref 0.0–0.4)
Eos: 4 %
Hematocrit: 47.3 % (ref 37.5–51.0)
Hemoglobin: 15 g/dL (ref 13.0–17.7)
Immature Grans (Abs): 0 x10E3/uL (ref 0.0–0.1)
Immature Granulocytes: 0 %
Lymphocytes Absolute: 1.5 x10E3/uL (ref 0.7–3.1)
Lymphs: 37 %
MCH: 27.2 pg (ref 26.6–33.0)
MCHC: 31.7 g/dL (ref 31.5–35.7)
MCV: 86 fL (ref 79–97)
Monocytes Absolute: 0.4 x10E3/uL (ref 0.1–0.9)
Monocytes: 9 %
Neutrophils Absolute: 2.1 x10E3/uL (ref 1.4–7.0)
Neutrophils: 49 %
Platelets: 208 x10E3/uL (ref 150–450)
RBC: 5.52 x10E6/uL (ref 4.14–5.80)
RDW: 14.8 % (ref 11.6–15.4)
WBC: 4.1 x10E3/uL (ref 3.4–10.8)

## 2024-03-06 LAB — TSH: TSH: 1.52 u[IU]/mL (ref 0.450–4.500)

## 2024-03-06 LAB — COMPREHENSIVE METABOLIC PANEL WITH GFR
ALT: 15 IU/L (ref 0–44)
AST: 20 IU/L (ref 0–40)
Albumin: 4.3 g/dL (ref 3.8–4.8)
Alkaline Phosphatase: 68 IU/L (ref 47–123)
BUN/Creatinine Ratio: 13 (ref 10–24)
BUN: 12 mg/dL (ref 8–27)
Bilirubin Total: 0.5 mg/dL (ref 0.0–1.2)
CO2: 22 mmol/L (ref 20–29)
Calcium: 9.6 mg/dL (ref 8.6–10.2)
Chloride: 101 mmol/L (ref 96–106)
Creatinine, Ser: 0.93 mg/dL (ref 0.76–1.27)
Globulin, Total: 2.8 g/dL (ref 1.5–4.5)
Glucose: 105 mg/dL — ABNORMAL HIGH (ref 70–99)
Potassium: 3.8 mmol/L (ref 3.5–5.2)
Sodium: 138 mmol/L (ref 134–144)
Total Protein: 7.1 g/dL (ref 6.0–8.5)
eGFR: 87 mL/min/1.73 (ref >59)

## 2024-03-06 LAB — LIPID PANEL W/O CHOL/HDL RATIO
Cholesterol, Total: 140 mg/dL (ref 100–199)
HDL: 51 mg/dL
LDL Chol Calc (NIH): 70 mg/dL (ref 0–99)
Triglycerides: 105 mg/dL (ref 0–149)
VLDL Cholesterol Cal: 19 mg/dL (ref 5–40)

## 2024-03-06 LAB — PSA: Prostate Specific Ag, Serum: 8.3 ng/mL — ABNORMAL HIGH (ref 0.0–4.0)

## 2024-03-06 MED ORDER — LEVOTHYROXINE SODIUM 75 MCG PO TABS: 75.0000 ug | ORAL_TABLET | Freq: Every day | ORAL | 3 refills | Status: AC

## 2024-03-06 NOTE — Assessment & Plan Note (Signed)
Doing great with A1c of 6.3. Continue current regimen. Continue to monitor. Call with any concerns.

## 2024-03-06 NOTE — Assessment & Plan Note (Signed)
 Rechecking labs today. Await results. Treat as needed.

## 2024-03-06 NOTE — Assessment & Plan Note (Signed)
 Under good control on current regimen. Continue current regimen. Continue to monitor. Call with any concerns. Refills given. Labs drawn today.

## 2024-06-02 ENCOUNTER — Other Ambulatory Visit: Payer: Self-pay | Admitting: Family Medicine

## 2024-06-02 DIAGNOSIS — E1169 Type 2 diabetes mellitus with other specified complication: Secondary | ICD-10-CM

## 2024-06-20 ENCOUNTER — Other Ambulatory Visit: Payer: Self-pay

## 2024-06-20 ENCOUNTER — Encounter: Admission: EM | Disposition: A | Payer: Self-pay | Source: Home / Self Care

## 2024-06-20 ENCOUNTER — Emergency Department

## 2024-06-20 ENCOUNTER — Inpatient Hospital Stay: Admission: EM | Admit: 2024-06-20 | Discharge: 2024-06-22 | DRG: 250 | Disposition: A

## 2024-06-20 DIAGNOSIS — Z6830 Body mass index (BMI) 30.0-30.9, adult: Secondary | ICD-10-CM

## 2024-06-20 DIAGNOSIS — I214 Non-ST elevation (NSTEMI) myocardial infarction: Principal | ICD-10-CM | POA: Diagnosis present

## 2024-06-20 DIAGNOSIS — E1159 Type 2 diabetes mellitus with other circulatory complications: Secondary | ICD-10-CM | POA: Diagnosis present

## 2024-06-20 DIAGNOSIS — E66811 Obesity, class 1: Secondary | ICD-10-CM | POA: Diagnosis present

## 2024-06-20 DIAGNOSIS — R079 Chest pain, unspecified: Principal | ICD-10-CM | POA: Diagnosis present

## 2024-06-20 DIAGNOSIS — M549 Dorsalgia, unspecified: Secondary | ICD-10-CM | POA: Diagnosis present

## 2024-06-20 DIAGNOSIS — Z823 Family history of stroke: Secondary | ICD-10-CM

## 2024-06-20 DIAGNOSIS — Z79890 Hormone replacement therapy (postmenopausal): Secondary | ICD-10-CM | POA: Diagnosis not present

## 2024-06-20 DIAGNOSIS — I2510 Atherosclerotic heart disease of native coronary artery without angina pectoris: Secondary | ICD-10-CM | POA: Diagnosis present

## 2024-06-20 DIAGNOSIS — E039 Hypothyroidism, unspecified: Secondary | ICD-10-CM | POA: Diagnosis present

## 2024-06-20 DIAGNOSIS — Z888 Allergy status to other drugs, medicaments and biological substances status: Secondary | ICD-10-CM | POA: Diagnosis not present

## 2024-06-20 DIAGNOSIS — M1991 Primary osteoarthritis, unspecified site: Secondary | ICD-10-CM | POA: Diagnosis present

## 2024-06-20 DIAGNOSIS — I9581 Postprocedural hypotension: Secondary | ICD-10-CM | POA: Diagnosis present

## 2024-06-20 DIAGNOSIS — Z7982 Long term (current) use of aspirin: Secondary | ICD-10-CM

## 2024-06-20 DIAGNOSIS — N400 Enlarged prostate without lower urinary tract symptoms: Secondary | ICD-10-CM | POA: Diagnosis present

## 2024-06-20 DIAGNOSIS — I255 Ischemic cardiomyopathy: Secondary | ICD-10-CM | POA: Diagnosis present

## 2024-06-20 DIAGNOSIS — E7849 Other hyperlipidemia: Secondary | ICD-10-CM | POA: Diagnosis present

## 2024-06-20 DIAGNOSIS — Z7901 Long term (current) use of anticoagulants: Secondary | ICD-10-CM

## 2024-06-20 DIAGNOSIS — E1169 Type 2 diabetes mellitus with other specified complication: Secondary | ICD-10-CM | POA: Diagnosis present

## 2024-06-20 DIAGNOSIS — G40909 Epilepsy, unspecified, not intractable, without status epilepticus: Secondary | ICD-10-CM | POA: Diagnosis present

## 2024-06-20 DIAGNOSIS — I152 Hypertension secondary to endocrine disorders: Secondary | ICD-10-CM | POA: Diagnosis present

## 2024-06-20 DIAGNOSIS — Z7984 Long term (current) use of oral hypoglycemic drugs: Secondary | ICD-10-CM

## 2024-06-20 DIAGNOSIS — G8929 Other chronic pain: Secondary | ICD-10-CM | POA: Diagnosis present

## 2024-06-20 DIAGNOSIS — D72828 Other elevated white blood cell count: Secondary | ICD-10-CM | POA: Diagnosis not present

## 2024-06-20 DIAGNOSIS — H409 Unspecified glaucoma: Secondary | ICD-10-CM | POA: Diagnosis present

## 2024-06-20 DIAGNOSIS — I5021 Acute systolic (congestive) heart failure: Secondary | ICD-10-CM | POA: Diagnosis present

## 2024-06-20 DIAGNOSIS — Z79899 Other long term (current) drug therapy: Secondary | ICD-10-CM

## 2024-06-20 DIAGNOSIS — Z8249 Family history of ischemic heart disease and other diseases of the circulatory system: Secondary | ICD-10-CM

## 2024-06-20 DIAGNOSIS — R001 Bradycardia, unspecified: Secondary | ICD-10-CM | POA: Diagnosis present

## 2024-06-20 DIAGNOSIS — E785 Hyperlipidemia, unspecified: Secondary | ICD-10-CM | POA: Diagnosis present

## 2024-06-20 DIAGNOSIS — R972 Elevated prostate specific antigen [PSA]: Secondary | ICD-10-CM | POA: Diagnosis present

## 2024-06-20 DIAGNOSIS — Z87891 Personal history of nicotine dependence: Secondary | ICD-10-CM

## 2024-06-20 DIAGNOSIS — E119 Type 2 diabetes mellitus without complications: Secondary | ICD-10-CM

## 2024-06-20 DIAGNOSIS — Z7902 Long term (current) use of antithrombotics/antiplatelets: Secondary | ICD-10-CM | POA: Diagnosis not present

## 2024-06-20 DIAGNOSIS — E1165 Type 2 diabetes mellitus with hyperglycemia: Secondary | ICD-10-CM | POA: Diagnosis present

## 2024-06-20 LAB — POCT ACTIVATED CLOTTING TIME
Activated Clotting Time: 112 s
Activated Clotting Time: 127 s
Activated Clotting Time: 179 s
Activated Clotting Time: 291 s

## 2024-06-20 LAB — CBC
HCT: 45.5 % (ref 39.0–52.0)
HCT: 41.7 % (ref 39.0–52.0)
Hemoglobin: 14.3 g/dL (ref 13.0–17.0)
Hemoglobin: 14.2 g/dL (ref 13.0–17.0)
MCH: 26.5 pg (ref 26.0–34.0)
MCH: 27.1 pg (ref 26.0–34.0)
MCHC: 31.4 g/dL (ref 30.0–36.0)
MCHC: 34.1 g/dL (ref 30.0–36.0)
MCV: 84.3 fL (ref 80.0–100.0)
MCV: 79.6 fL — ABNORMAL LOW (ref 80.0–100.0)
Platelets: 169 10*3/uL (ref 150–400)
Platelets: 183 10*3/uL (ref 150–400)
RBC: 5.4 MIL/uL (ref 4.22–5.81)
RBC: 5.24 MIL/uL (ref 4.22–5.81)
RDW: 15 % (ref 11.5–15.5)
RDW: 14.6 % (ref 11.5–15.5)
WBC: 5.2 10*3/uL (ref 4.0–10.5)
WBC: 10.1 10*3/uL (ref 4.0–10.5)
nRBC: 0 % (ref 0.0–0.2)
nRBC: 0 % (ref 0.0–0.2)

## 2024-06-20 LAB — BASIC METABOLIC PANEL WITH GFR
Anion gap: 12 (ref 5–15)
BUN: 18 mg/dL (ref 8–23)
CO2: 21 mmol/L — ABNORMAL LOW (ref 22–32)
Calcium: 9.1 mg/dL (ref 8.9–10.3)
Chloride: 105 mmol/L (ref 98–111)
Creatinine, Ser: 1.22 mg/dL (ref 0.61–1.24)
GFR, Estimated: >60 mL/min
Glucose, Bld: 172 mg/dL — ABNORMAL HIGH (ref 70–99)
Potassium: 4.2 mmol/L (ref 3.5–5.1)
Sodium: 138 mmol/L (ref 135–145)

## 2024-06-20 LAB — CBG MONITORING, ED: Glucose-Capillary: 163 mg/dL — ABNORMAL HIGH (ref 70–99)

## 2024-06-20 LAB — TROPONIN T, HIGH SENSITIVITY
Troponin T High Sensitivity: 9 ng/L (ref 0–19)
Troponin T High Sensitivity: 13 ng/L (ref 0–19)
Troponin T High Sensitivity: 23 ng/L — ABNORMAL HIGH (ref 0–19)
Troponin T High Sensitivity: 113 ng/L (ref 0–19)
Troponin T High Sensitivity: 290 ng/L (ref 0–19)
Troponin T High Sensitivity: 685 ng/L (ref 0–19)
Troponin T High Sensitivity: 1249 ng/L (ref 0–19)

## 2024-06-20 LAB — PROTIME-INR
INR: 1.1 (ref 0.8–1.2)
Prothrombin Time: 14.9 s (ref 11.4–15.2)

## 2024-06-20 LAB — GLUCOSE, CAPILLARY: Glucose-Capillary: 167 mg/dL — ABNORMAL HIGH (ref 70–99)

## 2024-06-20 LAB — LIPID PANEL
Cholesterol: 91 mg/dL (ref 0–200)
HDL: 36 mg/dL — ABNORMAL LOW
LDL Cholesterol: 49 mg/dL (ref 0–99)
Total CHOL/HDL Ratio: 2.5 ratio
Triglycerides: 32 mg/dL
VLDL: 6 mg/dL (ref 0–40)

## 2024-06-20 LAB — PRO BRAIN NATRIURETIC PEPTIDE: Pro Brain Natriuretic Peptide: 101 pg/mL

## 2024-06-20 LAB — LIPASE, BLOOD: Lipase: 21 U/L (ref 11–51)

## 2024-06-20 LAB — MAGNESIUM: Magnesium: 1.9 mg/dL (ref 1.7–2.4)

## 2024-06-20 MED ORDER — TIROFIBAN (AGGRASTAT) BOLUS VIA INFUSION
INTRAVENOUS | Status: DC | PRN
  Administered 2024-06-20: 2222.5 ug via INTRAVENOUS

## 2024-06-20 MED ORDER — HEPARIN BOLUS VIA INFUSION
4000.0000 [IU] | Freq: Once | INTRAVENOUS | Status: AC
  Administered 2024-06-20: 4000 [IU] via INTRAVENOUS
  Filled 2024-06-20: qty 4000

## 2024-06-20 MED ORDER — LABETALOL HCL 5 MG/ML IV SOLN: 10.0000 mg | INTRAVENOUS | Status: AC | PRN

## 2024-06-20 MED ORDER — HEPARIN (PORCINE) 25000 UT/250ML-% IV SOLN
1150.0000 [IU]/h | INTRAVENOUS | Status: DC
  Administered 2024-06-20: 1150 [IU]/h via INTRAVENOUS
  Filled 2024-06-20: qty 250

## 2024-06-20 MED ORDER — LIDOCAINE 5 % EX PTCH
1.0000 | MEDICATED_PATCH | CUTANEOUS | Status: DC
  Administered 2024-06-20 – 2024-06-21 (×2): 1 via TRANSDERMAL
  Filled 2024-06-20 (×3): qty 1

## 2024-06-20 MED ORDER — SODIUM CHLORIDE 0.9% FLUSH
3.0000 mL | Freq: Two times a day (BID) | INTRAVENOUS | Status: DC
  Administered 2024-06-20 – 2024-06-22 (×4): 3 mL via INTRAVENOUS

## 2024-06-20 MED ORDER — TRAMADOL HCL 50 MG PO TABS
50.0000 mg | ORAL_TABLET | Freq: Four times a day (QID) | ORAL | Status: DC | PRN
  Administered 2024-06-20: 50 mg via ORAL
  Filled 2024-06-20: qty 1

## 2024-06-20 MED ORDER — ACETAMINOPHEN 325 MG PO TABS
650.0000 mg | ORAL_TABLET | Freq: Four times a day (QID) | ORAL | Status: DC | PRN
  Administered 2024-06-20: 650 mg via ORAL
  Filled 2024-06-20: qty 2

## 2024-06-20 MED ORDER — MORPHINE SULFATE (PF) 4 MG/ML IV SOLN
4.0000 mg | Freq: Once | INTRAVENOUS | Status: AC
  Administered 2024-06-20: 4 mg via INTRAVENOUS
  Filled 2024-06-20: qty 1

## 2024-06-20 MED ORDER — TRAMADOL HCL 50 MG PO TABS: 50.0000 mg | ORAL_TABLET | Freq: Four times a day (QID) | ORAL | Status: DC

## 2024-06-20 MED ORDER — SODIUM CHLORIDE 0.9 % IV SOLN: 250.0000 mL | INTRAVENOUS | Status: AC | PRN

## 2024-06-20 MED ORDER — HEPARIN SODIUM (PORCINE) 5000 UNIT/ML IJ SOLN
5000.0000 [IU] | Freq: Three times a day (TID) | INTRAMUSCULAR | Status: DC
  Administered 2024-06-20: 5000 [IU] via SUBCUTANEOUS
  Filled 2024-06-20: qty 1

## 2024-06-20 MED ORDER — HYDRALAZINE HCL 20 MG/ML IJ SOLN: 10.0000 mg | INTRAMUSCULAR | Status: AC | PRN

## 2024-06-20 MED ORDER — IOHEXOL 300 MG/ML  SOLN
INTRAMUSCULAR | Status: DC | PRN
  Administered 2024-06-20: 160 mL

## 2024-06-20 MED ORDER — FREE WATER
500.0000 mL | Freq: Once | Status: AC
  Administered 2024-06-20: 500 mL via ORAL

## 2024-06-20 MED ORDER — HEPARIN SODIUM (PORCINE) 1000 UNIT/ML IJ SOLN
INTRAMUSCULAR | Status: DC | PRN
  Administered 2024-06-20: 3000 [IU] via INTRAVENOUS
  Administered 2024-06-20: 4000 [IU] via INTRAVENOUS
  Administered 2024-06-20 (×2): 5000 [IU] via INTRAVENOUS

## 2024-06-20 MED ORDER — ENOXAPARIN SODIUM 60 MG/0.6ML IJ SOSY
50.0000 mg | PREFILLED_SYRINGE | INTRAMUSCULAR | Status: DC
  Administered 2024-06-21 – 2024-06-22 (×2): 50 mg via SUBCUTANEOUS
  Filled 2024-06-20 (×2): qty 0.6

## 2024-06-20 MED ORDER — FENTANYL CITRATE (PF) 100 MCG/2ML IJ SOLN
INTRAMUSCULAR | Status: DC | PRN
  Administered 2024-06-20: 25 ug via INTRAVENOUS

## 2024-06-20 MED ORDER — OXYCODONE HCL 5 MG PO TABS
5.0000 mg | ORAL_TABLET | Freq: Four times a day (QID) | ORAL | Status: DC | PRN
  Administered 2024-06-20 – 2024-06-21 (×2): 5 mg via ORAL
  Filled 2024-06-20 (×2): qty 1

## 2024-06-20 MED ORDER — HEPARIN (PORCINE) IN NACL 1000-0.9 UT/500ML-% IV SOLN
INTRAVENOUS | Status: AC
  Filled 2024-06-20: qty 1000

## 2024-06-20 MED ORDER — INSULIN ASPART 100 UNIT/ML IJ SOLN: 0.0000 [IU] | Freq: Every day | INTRAMUSCULAR | Status: DC

## 2024-06-20 MED ORDER — NITROGLYCERIN 1 MG/10 ML FOR IR/CATH LAB
INTRA_ARTERIAL | Status: AC
  Filled 2024-06-20: qty 10

## 2024-06-20 MED ORDER — TIROFIBAN HCL IN NACL 5-0.9 MG/100ML-% IV SOLN
INTRAVENOUS | Status: AC
  Filled 2024-06-20: qty 100

## 2024-06-20 MED ORDER — TICAGRELOR 90 MG PO TABS
ORAL_TABLET | ORAL | Status: AC
  Filled 2024-06-20: qty 2

## 2024-06-20 MED ORDER — ACETAMINOPHEN 650 MG RE SUPP: 650.0000 mg | Freq: Four times a day (QID) | RECTAL | Status: DC | PRN

## 2024-06-20 MED ORDER — VERAPAMIL HCL 2.5 MG/ML IV SOLN
INTRAVENOUS | Status: AC
  Filled 2024-06-20: qty 2

## 2024-06-20 MED ORDER — TICAGRELOR 90 MG PO TABS
90.0000 mg | ORAL_TABLET | Freq: Two times a day (BID) | ORAL | Status: DC
  Administered 2024-06-21 – 2024-06-22 (×3): 90 mg via ORAL
  Filled 2024-06-20 (×3): qty 1

## 2024-06-20 MED ORDER — LATANOPROST 0.005 % OP SOLN
1.0000 [drp] | Freq: Every day | OPHTHALMIC | Status: DC
  Administered 2024-06-21: 1 [drp] via OPHTHALMIC
  Filled 2024-06-20: qty 2.5

## 2024-06-20 MED ORDER — NITROGLYCERIN IN D5W 200-5 MCG/ML-% IV SOLN
0.0000 ug/min | INTRAVENOUS | Status: DC
  Administered 2024-06-20: 5 ug/min via INTRAVENOUS
  Filled 2024-06-20: qty 250

## 2024-06-20 MED ORDER — SENNOSIDES-DOCUSATE SODIUM 8.6-50 MG PO TABS: 1.0000 | ORAL_TABLET | Freq: Every evening | ORAL | Status: DC | PRN

## 2024-06-20 MED ORDER — SODIUM CHLORIDE 0.9% FLUSH: 3.0000 mL | INTRAVENOUS | Status: DC | PRN

## 2024-06-20 MED ORDER — VERAPAMIL HCL 2.5 MG/ML IV SOLN
INTRAVENOUS | Status: DC | PRN
  Administered 2024-06-20 (×2): 2.5 mg via INTRA_ARTERIAL

## 2024-06-20 MED ORDER — ONDANSETRON HCL 4 MG/2ML IJ SOLN
4.0000 mg | Freq: Once | INTRAMUSCULAR | Status: AC
  Administered 2024-06-20: 4 mg via INTRAVENOUS
  Filled 2024-06-20: qty 2

## 2024-06-20 MED ORDER — MIDAZOLAM HCL 2 MG/2ML IJ SOLN
INTRAMUSCULAR | Status: AC
  Filled 2024-06-20: qty 2

## 2024-06-20 MED ORDER — TICAGRELOR 90 MG PO TABS
ORAL_TABLET | ORAL | Status: DC | PRN
  Administered 2024-06-20: 180 mg via ORAL

## 2024-06-20 MED ORDER — IOHEXOL 350 MG/ML SOLN
100.0000 mL | Freq: Once | INTRAVENOUS | Status: AC | PRN
  Administered 2024-06-20: 100 mL via INTRAVENOUS

## 2024-06-20 MED ORDER — FENTANYL CITRATE (PF) 100 MCG/2ML IJ SOLN
INTRAMUSCULAR | Status: AC
  Filled 2024-06-20: qty 2

## 2024-06-20 MED ORDER — ATORVASTATIN CALCIUM 80 MG PO TABS
80.0000 mg | ORAL_TABLET | Freq: Every day | ORAL | Status: DC
  Administered 2024-06-21 – 2024-06-22 (×2): 80 mg via ORAL
  Filled 2024-06-20 (×2): qty 1

## 2024-06-20 MED ORDER — HEPARIN SODIUM (PORCINE) 1000 UNIT/ML IJ SOLN
INTRAMUSCULAR | Status: AC
  Filled 2024-06-20: qty 10

## 2024-06-20 MED ORDER — ONDANSETRON HCL 4 MG/2ML IJ SOLN
4.0000 mg | Freq: Four times a day (QID) | INTRAMUSCULAR | Status: DC | PRN
  Administered 2024-06-20 – 2024-06-21 (×3): 4 mg via INTRAVENOUS
  Filled 2024-06-20 (×3): qty 2

## 2024-06-20 MED ORDER — INSULIN ASPART 100 UNIT/ML IJ SOLN
0.0000 [IU] | Freq: Three times a day (TID) | INTRAMUSCULAR | Status: DC
  Administered 2024-06-20 – 2024-06-21 (×2): 2 [IU] via SUBCUTANEOUS
  Administered 2024-06-21: 1 [IU] via SUBCUTANEOUS
  Administered 2024-06-21 – 2024-06-22 (×2): 2 [IU] via SUBCUTANEOUS
  Filled 2024-06-20: qty 1
  Filled 2024-06-20 (×4): qty 2

## 2024-06-20 MED ORDER — ATORVASTATIN CALCIUM 20 MG PO TABS
20.0000 mg | ORAL_TABLET | Freq: Every day | ORAL | Status: DC
  Administered 2024-06-20: 20 mg via ORAL
  Filled 2024-06-20: qty 1

## 2024-06-20 MED ORDER — LIDOCAINE HCL (PF) 1 % IJ SOLN
INTRAMUSCULAR | Status: DC | PRN
  Administered 2024-06-20: 2 mL

## 2024-06-20 MED ORDER — ASPIRIN 81 MG PO TBEC
81.0000 mg | DELAYED_RELEASE_TABLET | Freq: Every day | ORAL | Status: DC
  Administered 2024-06-21 – 2024-06-22 (×2): 81 mg via ORAL
  Filled 2024-06-20 (×2): qty 1

## 2024-06-20 MED ORDER — LEVOTHYROXINE SODIUM 50 MCG PO TABS
75.0000 ug | ORAL_TABLET | Freq: Every day | ORAL | Status: DC
  Administered 2024-06-22: 75 ug via ORAL
  Filled 2024-06-20 (×2): qty 2

## 2024-06-20 MED ORDER — HEPARIN (PORCINE) IN NACL 1000-0.9 UT/500ML-% IV SOLN
INTRAVENOUS | Status: DC | PRN
  Administered 2024-06-20 (×2): 500 mL

## 2024-06-20 MED ORDER — NITROGLYCERIN 1 MG/10 ML FOR IR/CATH LAB
INTRA_ARTERIAL | Status: DC | PRN
  Administered 2024-06-20: 100 ug via INTRACORONARY
  Administered 2024-06-20: 200 ug via INTRACORONARY

## 2024-06-20 NOTE — ED Provider Notes (Signed)
 "  [facility] Provider Note    Event Date/Time   First MD Initiated Contact with Patient 06/20/24 [phone]     (approximate)   History   Chest Pain   HPI  Donald Le is a 74 y.o. male with a past medical history of diabetes, hypertension, hyperlipidemia, presenting to the emergency department via EMS for chest pressure that radiates down his left arm.  The patient also states that the pain radiates through his upper back between his shoulders.  He was given aspirin  and a saline bolus by EMS.  Patient did have nausea and vomiting with EMS as well and was treated with Zofran .  EMS noted that the patient was orthostatic positive.     Physical Exam   Triage Vital Signs: ED Triage Vitals  Encounter Vitals Group     BP 06/20/24 0844 (!) 56/39     Girls Systolic BP Percentile --      Girls Diastolic BP Percentile --      Boys Systolic BP Percentile --      Boys Diastolic BP Percentile --      Pulse --      Resp 06/20/24 0848 14     Temp --      Temp src --      SpO2 --      Weight 06/20/24 0847 196 lb (88.9 kg)     Height --      Head Circumference --      Peak Flow --      Pain Score 06/20/24 0847 7     Pain Loc --      Pain Education --      Exclude from Growth Chart --     Most recent vital signs: Vitals:   06/20/24 0844 06/20/24 0848  BP: (!) 56/39 126/84  Resp:  14     General: Awake, no distress.  CV:  Good peripheral perfusion.  Resp:  Normal effort.  Abd:  No distention.  Other:     ED Results / Procedures / Treatments   Labs (all labs ordered are listed, but only abnormal results are displayed) Labs Reviewed  CBC  BASIC METABOLIC PANEL WITH GFR  PROTIME-INR  LIPASE, BLOOD  TROPONIN T, HIGH SENSITIVITY     EKG  ED ECG REPORT I, Reche CHRISTELLA Leventhal, the attending physician, personally viewed and interpreted this ECG.  Date: 06/20/2024 Rate: 52 bpm Rhythm: sinus rhythm QRS Axis: normal Intervals: normal ST/T  Wave abnormalities: normal Narrative Interpretation: no evidence of acute ischemia   RADIOLOGY I, Reche CHRISTELLA Leventhal, personally viewed and evaluated these images (plain radiographs) as part of my medical decision making, as well as reviewing the written report by the radiologist.  CXR does not show any acute pathology.  CTA chest/abdomen/pelvis: IMPRESSION: 1. No acute findings.    PROCEDURES:  Critical Care performed: No  Procedures   MEDICATIONS ORDERED IN ED: Medications  iohexol  (OMNIPAQUE ) 350 MG/ML injection 100 mL (100 mLs Intravenous Contrast Given 06/20/24 0859)     IMPRESSION / MDM / ASSESSMENT AND PLAN / ED COURSE  I reviewed the triage vital signs and the nursing notes.                              Differential diagnosis includes, but is not limited to, ACS, aortic dissection, pulmonary embolism, cardiac tamponade, pneumothorax, pneumonia, pericarditis, myocarditis, GI-related causes including esophagitis/gastritis, and musculoskeletal chest wall  pain.     Patient's presentation is most consistent with acute presentation with potential threat to life or bodily function.  Patient is a 73 year old male with past medical history of diabetes, hypertension, hyperlipidemia, presenting to the emergency department via EMS for chest pain that radiates down his left arm and into his upper back between his shoulders.  He was given aspirin  as well as a normal saline bolus by EMS.  Also treated with Zofran  for nausea and vomiting.  Nursing staff noted that the patient had discrepancy in his blood pressures between upper extremities.  Given the patient's concerning symptoms and this finding patient was taken for a CTA of the chest/abdomen/pelvis to rule out aortic dissection.  EKG chest x-ray and lab work ordered as well.  CTA did not show any acute pathology.  Chest x-ray unremarkable.  EKG does not show any acute ST elevation however compared to his previous there are  changes concerning for ischemia in the inferior leads.  No reciprocal changes.  Patient's initial troponin was 9, repeat of 13, and another repeat of 23.  Patient's symptoms improved with morphine  but he reports he is still having some chest tightness.  Patient has a heart score of 7.  I discussed with the patient his results and discussed that I would like to admit him to the hospital for further cardiac workup given his symptoms and upward trending troponin.  Patient is agreeable.      FINAL CLINICAL IMPRESSION(S) / ED DIAGNOSES   Final diagnoses:  Chest pain, unspecified type     Rx / DC Orders   ED Discharge Orders     None        Note:  This document was prepared using Dragon voice recognition software and may include unintentional dictation errors.   R[place]ford Reche HERO, MD 06/20/24 1308  "

## 2024-06-20 NOTE — ED Notes (Signed)
 Cardiologist at bedside.

## 2024-06-20 NOTE — ED Notes (Signed)
 Family member at bedside.

## 2024-06-20 NOTE — ED Notes (Signed)
 Dr Fernand aware that troponin 113 up from 23.

## 2024-06-20 NOTE — ED Notes (Addendum)
 Pt vomiting at bedside. Pt restless. EKG leads hooked back up at this time. MD made aware. New EKG captured.

## 2024-06-20 NOTE — ED Notes (Signed)
"  Pt placed in hospital gown   "

## 2024-06-20 NOTE — Progress Notes (Signed)
 SPIRITUAL CARE AND COUNSELING CONSULT NOTE   VISIT SUMMARY Chaplain On-Call responded to Code STEMI notification at 2027 hours to ED, room 1. Provided spiritual and emotional support for patient and his wife Tressie. Patient was taken to the Cath Lab. Chaplain escorted Tressie to the Heart and Vascular Waiting Room. Assured her of availability as needed.   SPIRITUAL ENCOUNTER                                                                                                                                                                      Type of Visit: Initial Care provided to:: Pt and family (patient's wife Tressie) Psychologist, Sport [employer] partners present during encounter: Nurse, Physician (RN Suzen; RN Rosina; Dr. Deatrice Cage) Referral source: Code page (Code STEMI notification at 2027 hours) Reason for visit: Code OnCall Visit: Yes   SPIRITUAL FRAMEWORK  Family Stress Factors: Health changes   GOALS       INTERVENTIONS   Spiritual Care Interventions Made: Compassionate presence    INTERVENTION OUTCOMES      SPIRITUAL CARE PLAN        If immediate needs arise, please contact [facility] 24 hour on call [phone]   Carlin KATHEE Lay, Chaplain  06/20/2024 9:24 PM

## 2024-06-20 NOTE — ED Notes (Signed)
 Pt placed on zol pads. Chaplin at bedside.

## 2024-06-20 NOTE — ED Notes (Signed)
 Heparin  level and trop sent to lab at this time.
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
## 2024-06-20 NOTE — Consult Note (Signed)
 Pharmacy Consult Note - Anticoagulation  Pharmacy Consult for heparin  infusion Indication: chest pain/ACS Allergies[1]  PATIENT MEASUREMENTS: Height: 5' 10 (177.8 cm) Weight: 88.9 kg (195 lb 15.8 oz) IBW/kg (Calculated) : 73 HEPARIN  DW (KG): 88.9  VITAL SIGNS: Temp: 97.9 F (36.6 C) (01/24 1734) Temp Source: Oral (01/24 1734) BP: 100/50 (01/24 2008) Pulse Rate: 78 (01/24 2008)  Recent Labs    06/20/24 0849 06/20/24 0859  HGB 14.3  --   HCT 45.5  --   PLT 169  --   LABPROT  --  14.9  INR  --  1.1  CREATININE 1.22  --     Estimated Creatinine Clearance: 61.5 mL/min (by C-G formula based on SCr of 1.22 mg/dL).  PAST MEDICAL HISTORY: Past Medical History:  Diagnosis Date   Hyperlipidemia    Hyperlipidemia associated with type 2 diabetes mellitus (HCC) 03/09/2020   Hypertension    Seizure disorder (HCC) 10/12/2021   Thyroid  disease    Grave's disease    Medications:  (Not in a hospital admission)  Scheduled:   [START ON 06/21/2024] aspirin  EC  81 mg Oral Daily   atorvastatin   20 mg Oral Daily   heparin   4,000 Units Intravenous Once   insulin  aspart  0-5 Units Subcutaneous QHS   insulin  aspart  0-9 Units Subcutaneous TID WC   latanoprost   1 drop Both Eyes QHS   [START ON 06/21/2024] levothyroxine   75 mcg Oral Daily   lidocaine   1 patch Transdermal Q24H   sodium chloride  flush  3 mL Intravenous Q12H   Infusions:   heparin      nitroGLYCERIN      PRN: acetaminophen  **OR** acetaminophen , ondansetron  (ZOFRAN ) IV, oxyCODONE , senna-docusate, traMADol   ASSESSMENT: 74 y.o. male with PMH HTN, HLD, T2DM, siezures is presenting with ACS. Patient is not on chronic anticoagulation per chart review. Pharmacy has been consulted to initiate and manage heparin  intravenous infusion.   Goal(s) of therapy: Heparin  level 0.3 - 0.7 units/mL aPTT 66 - 102 seconds Monitor platelets by anticoagulation protocol: Yes   Baseline anticoagulation labs: Recent Labs    06/20/24 0849  06/20/24 0859  INR  --  1.1  HGB 14.3  --   PLT 169  --    Baseline aPTT pending    PLAN:  Give 4,000 units bolus x1; then start heparin  infusion at 1150 units/hour.  Check heparin  level in 8 hours, then daily once at least two levels are consecutively therapeutic.  Monitor CBC daily while on heparin  infusion.    Annabella LOISE Banks, PharmD Clinical Pharmacist 06/20/2024 8:29 PM     [1]  Allergies Allergen Reactions   Ace Inhibitors Other (See Comments)    ARF

## 2024-06-20 NOTE — ED Notes (Signed)
 Obtained EKG on pt. EKG monitor would not input pt info other than MRN. This tech had to input first name, last name, and DOB. This was the 2nd EKG machine to not input pt info after scanning his bracelet. Turned EKG machine off then back on x3 with no fix.

## 2024-06-20 NOTE — ED Notes (Addendum)
 Dr. Meliton called this RN to notify me that pt is a code stemi.

## 2024-06-20 NOTE — ED Notes (Signed)
 Pt transported to cath lab at this time.

## 2024-06-20 NOTE — ED Triage Notes (Signed)
 Pt arrived via EMS from home due to having chest pressure with pain that radiates down to his left arm. Pt did have N/V with EMS and was given zofran . Per EMS, pt was orthostatic positive. Pt received Aspirin , NS bolus aswell. Pt sts that he is having upper back pain in between his shoulders.

## 2024-06-20 NOTE — Progress Notes (Signed)
 eLink Physician-Brief Progress Note Patient Name: GAGE WEANT DOB: 1950/07/07 MRN: [id number]   Date of Service  06/20/2024  HPI/Events of Note  NSTEMI s/p cardiac cath. Cariology note reviewed. CCM note pending. Seen on camera. No distress, eating his meal.   eICU Interventions  Chart reviewed Call E link if needed      Intervention Category Evaluation Type: New Patient Evaluation  Cheryll KANDICE Bang 06/20/2024, 11:17 PM
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
## 2024-06-20 NOTE — ED Notes (Signed)
 Troponin 113 up from 23. Informed provider Fernand.

## 2024-06-20 NOTE — Progress Notes (Signed)
 PHARMACIST - PHYSICIAN COMMUNICATION  CONCERNING:  Enoxaparin  (Lovenox ) for DVT Prophylaxis    RECOMMENDATION: Patient was prescribed enoxaprin 40mg  q24 hours for VTE prophylaxis.   Filed Weights   06/20/24 0847 06/20/24 2021 06/20/24 2251  Weight: 88.9 kg (196 lb) 88.9 kg (195 lb 15.8 oz) 95.6 kg (210 lb 12.2 oz)    Body mass index is 30.24 kg/m.  Estimated Creatinine Clearance: 63.5 mL/min (by C-G formula based on SCr of 1.22 mg/dL).   Based on [facility] policy patient is candidate for enoxaparin  0.5mg /kg TBW SQ every 24 hours based on BMI being >30.  DESCRIPTION: Pharmacy has adjusted enoxaparin  dose per [facility] policy.  Patient is now receiving enoxaparin  0.5 mg/kg every 24 hours   Rankin CANDIE Dills, PharmD, [facility] 06/20/2024 11:02 PM

## 2024-06-21 ENCOUNTER — Inpatient Hospital Stay
Admit: 2024-06-21 | Discharge: 2024-06-21 | Disposition: A | Attending: Cardiovascular Disease | Admitting: Cardiovascular Disease

## 2024-06-21 DIAGNOSIS — R079 Chest pain, unspecified: Secondary | ICD-10-CM

## 2024-06-21 DIAGNOSIS — I9581 Postprocedural hypotension: Secondary | ICD-10-CM

## 2024-06-21 DIAGNOSIS — E1169 Type 2 diabetes mellitus with other specified complication: Secondary | ICD-10-CM

## 2024-06-21 DIAGNOSIS — I214 Non-ST elevation (NSTEMI) myocardial infarction: Secondary | ICD-10-CM

## 2024-06-21 DIAGNOSIS — E785 Hyperlipidemia, unspecified: Secondary | ICD-10-CM | POA: Diagnosis not present

## 2024-06-21 LAB — BASIC METABOLIC PANEL WITH GFR
Anion gap: 12 (ref 5–15)
Anion gap: 12 (ref 5–15)
BUN: 17 mg/dL (ref 8–23)
BUN: 17 mg/dL (ref 8–23)
CO2: 24 mmol/L (ref 22–32)
CO2: 24 mmol/L (ref 22–32)
Calcium: 9.3 mg/dL (ref 8.9–10.3)
Calcium: 9.1 mg/dL (ref 8.9–10.3)
Chloride: 102 mmol/L (ref 98–111)
Chloride: 103 mmol/L (ref 98–111)
Creatinine, Ser: 1.18 mg/dL (ref 0.61–1.24)
Creatinine, Ser: 1.21 mg/dL (ref 0.61–1.24)
GFR, Estimated: >60 mL/min
GFR, Estimated: >60 mL/min
Glucose, Bld: 173 mg/dL — ABNORMAL HIGH (ref 70–99)
Glucose, Bld: 169 mg/dL — ABNORMAL HIGH (ref 70–99)
Potassium: 3.7 mmol/L (ref 3.5–5.1)
Potassium: 3.6 mmol/L (ref 3.5–5.1)
Sodium: 138 mmol/L (ref 135–145)
Sodium: 138 mmol/L (ref 135–145)

## 2024-06-21 LAB — ECHOCARDIOGRAM COMPLETE
AR max vel: 2.24 cm2
AV Area VTI: 2.45 cm2
AV Area mean vel: 2.05 cm2
AV Mean grad: 3 mmHg
AV Peak grad: 5.8 mmHg
Ao pk vel: 1.2 m/s
Area-P 1/2: 4.36 cm2
Height: 70 in
MV VTI: 1.36 cm2
S' Lateral: 3.1 cm
Weight: 3372.16 [oz_av]

## 2024-06-21 LAB — CBC
HCT: 40.5 % (ref 39.0–52.0)
Hemoglobin: 14.2 g/dL (ref 13.0–17.0)
MCH: 27.4 pg (ref 26.0–34.0)
MCHC: 35.1 g/dL (ref 30.0–36.0)
MCV: 78.2 fL — ABNORMAL LOW (ref 80.0–100.0)
Platelets: 179 10*3/uL (ref 150–400)
RBC: 5.18 MIL/uL (ref 4.22–5.81)
RDW: 14.6 % (ref 11.5–15.5)
WBC: 11 10*3/uL — ABNORMAL HIGH (ref 4.0–10.5)
nRBC: 0 % (ref 0.0–0.2)

## 2024-06-21 LAB — GLUCOSE, CAPILLARY
Glucose-Capillary: 161 mg/dL — ABNORMAL HIGH (ref 70–99)
Glucose-Capillary: 154 mg/dL — ABNORMAL HIGH (ref 70–99)
Glucose-Capillary: 140 mg/dL — ABNORMAL HIGH (ref 70–99)
Glucose-Capillary: 125 mg/dL — ABNORMAL HIGH (ref 70–99)

## 2024-06-21 LAB — TSH: TSH: 0.616 u[IU]/mL (ref 0.350–4.500)

## 2024-06-21 LAB — TROPONIN T, HIGH SENSITIVITY
Troponin T High Sensitivity: 5511 ng/L (ref 0–19)
Troponin T High Sensitivity: 5408 ng/L (ref 0–19)
Troponin T High Sensitivity: 4130 ng/L (ref 0–19)

## 2024-06-21 LAB — HEMOGLOBIN A1C
Hgb A1c MFr Bld: 6.7 % — ABNORMAL HIGH (ref 4.8–5.6)
Mean Plasma Glucose: 145.59 mg/dL

## 2024-06-21 LAB — CREATININE, SERUM
Creatinine, Ser: 1.11 mg/dL (ref 0.61–1.24)
GFR, Estimated: >60 mL/min

## 2024-06-21 LAB — T4, FREE: Free T4: 1.14 ng/dL (ref 0.80–2.00)

## 2024-06-21 MED ORDER — ALUM & MAG HYDROXIDE-SIMETH 200-200-20 MG/5ML PO SUSP
15.0000 mL | ORAL | Status: DC | PRN
  Administered 2024-06-21: 15 mL via ORAL
  Filled 2024-06-21: qty 30

## 2024-06-21 MED ORDER — PERFLUTREN LIPID MICROSPHERE
1.0000 mL | INTRAVENOUS | Status: AC | PRN
  Administered 2024-06-21: 2 mL via INTRAVENOUS

## 2024-06-21 MED ORDER — DORZOLAMIDE HCL-TIMOLOL MAL 2-0.5 % OP SOLN
1.0000 [drp] | Freq: Two times a day (BID) | OPHTHALMIC | Status: DC
  Administered 2024-06-21 – 2024-06-22 (×3): 1 [drp] via OPHTHALMIC
  Filled 2024-06-21: qty 10

## 2024-06-21 MED ORDER — PERFLUTREN LIPID MICROSPHERE: 1.0000 mL | INTRAVENOUS | Status: DC | PRN

## 2024-06-21 MED ORDER — CHLORHEXIDINE GLUCONATE CLOTH 2 % EX PADS
6.0000 | MEDICATED_PAD | Freq: Every day | CUTANEOUS | Status: DC
  Administered 2024-06-21 – 2024-06-22 (×2): 6 via TOPICAL

## 2024-06-21 MED ORDER — SODIUM CHLORIDE 0.9 % IV BOLUS
500.0000 mL | Freq: Once | INTRAVENOUS | Status: AC
  Administered 2024-06-21: 500 mL via INTRAVENOUS

## 2024-06-21 NOTE — TOC Progression Note (Signed)
 Transition of Care [facility]) - Progression Note    Patient Details  Name: Donald Le MRN: [id number] Date of Birth: 1950-12-30  [facility] of [facility]) CM/SW Contact  [place] JINNY Ruts, LCSW Phone Number: 06/21/2024, 2:19 PM  Clinical Narrative:    Chart reviewed. Patient is currently resting. SW will reach out to patient once awake.                     Expected Discharge Plan and Services                                               Social Drivers of Health (SDOH) Interventions SDOH Screenings   Food Insecurity: No Food Insecurity (06/21/2024)  Housing: Low Risk (06/21/2024)  Transportation Needs: No Transportation Needs (06/21/2024)  Utilities: Not At Risk (06/21/2024)  Alcohol Screen: Low Risk (10/22/2023)  Depression (PHQ2-9): Low Risk (03/05/2024)  Financial Resource Strain: Low Risk (10/22/2023)  Physical Activity: Sufficiently Active (10/22/2023)  Social Connections: Moderately Integrated (06/21/2024)  Stress: No Stress Concern Present (10/22/2023)  Tobacco Use: Medium Risk (06/20/2024)  Health Literacy: Adequate Health Literacy (10/22/2023)    Readmission Risk Interventions     No data to display

## 2024-06-21 NOTE — Progress Notes (Signed)
*  PRELIMINARY RESULTS* Echocardiogram 2D Echocardiogram has been performed.  Floydene Harder 06/21/2024, 1:03 PM
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
## 2024-06-21 NOTE — Plan of Care (Signed)
   Problem: Coping: Goal: Ability to adjust to condition or change in health will improve Outcome: Progressing   Problem: Metabolic: Goal: Ability to maintain appropriate glucose levels will improve Outcome: Progressing   Problem: Skin Integrity: Goal: Risk for impaired skin integrity will decrease Outcome: Progressing

## 2024-06-21 NOTE — Plan of Care (Signed)
" °  Problem: Education: Goal: Ability to describe self-care measures that may prevent or decrease complications (Diabetes Survival Skills Education) will improve Outcome: Progressing Goal: Individualized Educational Video(s) Outcome: Progressing   Problem: Coping: Goal: Ability to adjust to condition or change in health will improve Outcome: Progressing   Problem: Fluid Volume: Goal: Ability to maintain a balanced intake and output will improve Outcome: Progressing   Problem: Health Behavior/Discharge Planning: Goal: Ability to identify and utilize available resources and services will improve Outcome: Progressing   Problem: Nutritional: Goal: Maintenance of adequate nutrition will improve Outcome: Progressing Goal: Progress toward achieving an optimal weight will improve Outcome: Progressing   Problem: Skin Integrity: Goal: Risk for impaired skin integrity will decrease Outcome: Progressing   Problem: Education: Goal: Knowledge of General Education information will improve Description: Including pain rating scale, medication(s)/side effects and non-pharmacologic comfort measures Outcome: Progressing   Problem: Health Behavior/Discharge Planning: Goal: Ability to manage health-related needs will improve Outcome: Progressing   Problem: Clinical Measurements: Goal: Ability to maintain clinical measurements within normal limits will improve Outcome: Progressing   "

## 2024-06-21 NOTE — Progress Notes (Addendum)
 " PROGRESS NOTE    Donald Le  FMW:[id number] DOB: 02/04/1951 DOA: 06/20/2024 PCP: Vicci Duwaine SQUIBB, DO  Chief Complaint  Patient presents with   Chest Pain    Hospital Course:  18M with PMH of T2DM, HTN, HLD, acquired hypothyroidism, and remote seizures, presented via EMS with acute-onset chest pressure radiating to L arm, upper back, and interscapular region with associated symptoms of nausea, vomiting, and orthostatic hypotension.  Patient admitted for chest pain, had ischemic symptoms and uptrending troponin, underwent urgent LHC 01/24 with 100% distal LCx occlusion s/p balloon angioplasty.  PCCM was consulted for possible pressor requirement, blood pressure remained stable and downgraded to TRH. Hospital course as below  Subjective: Patient was examined at the bedside, new to me today.  Wife also present at the bedside Reports doing significantly better, continues to have chronic back pain, no new symptoms Continue to monitor per cardiology Downgrade to progressive care unit   Objective: Vitals:   06/21/24 0530 06/21/24 0600 06/21/24 0700 06/21/24 0800  BP: (!) 91/58 106/69 (!) 97/58 102/67  Pulse: (!) 54 (!) 51 (!) 51 (!) 55  Resp: 14 (!) 21 (!) 22 19  Temp:    98.5 F (36.9 C)  TempSrc:    Oral  SpO2: 95% 95% 97% 99%  Weight:      Height:        Intake/Output Summary (Last 24 hours) at 06/21/2024 0844 Last data filed at 06/21/2024 0500 Gross per 24 hour  Intake 1545.91 ml  Output 600 ml  Net 945.91 ml   Filed Weights   06/20/24 0847 06/20/24 2021 06/20/24 2251  Weight: 88.9 kg 88.9 kg 95.6 kg    Examination: General: No acute distress. Cardio: regular rhythm, normal rate, S1, S2, no M/R/G,  Lungs: CTAB, mild crackles without wheezes, no increased WOB,  Ext: No Edema, cap refill  Neuro: No gross focal deficit Psych:  Mood and Affect: Mood normal.  Abd: Soft: BS x 4, NTND Skin: Intact, warm, no rashes lesion, or ulcer  Assessment & Plan:  NSTEMI - s/p  Balloon Angioplasty to Distal Lcx CAD Underwent urgent LHC 01/24 100% distal LCx occlusion with thrombus, s/p balloon angioplasty, no stent (vessel was fairly small). continue DAPT:Aspirin  81 mg daily indefinitely and Ticagrelor  90 mg BID >=12 mo High intensity statin Cardiology following, appreciate recs Cardiac rehab at discharge   Acute Systolic HF Secondary to Ischemic CM EF 45-50%, LVEDP 20 mmHg TTE shows EF 55 to 60%, no RWMA.  Mild LVH.  Grade 1 DD. Not on BB due to bradycardia GDMT on hold due to soft blood pressure, bradycardia Euvolemic on exam  HTN BP running low since NSTEMI Hold home amlodipine , hydrochlorothiazide , losartan    Hyperlipidemia Increase atorvastatin  20 mg to 80 mg daily  Mild leukocytosis Likely reactive, No symptoms/signs of infection Monitor WBC   Type 2 DM HbA1c 6.7 Hold Metformin , SSI   Hypothyroidism Continue levothyroxine  75 mcg   Remote Seizure Disorder Seizure precautions No AED therapy indicated d/c Tramadol  risk of lowering seizure threshold  Back pain - musculoskeletal Has been having bilateral upper back pain, worse with lifting anything and relieved with rest. CTA negative, dissection ruled out. Pain management with heating pads, lidocaine  patch, Tylenol , oxycodone  as needed  Glaucoma Continue home eyedrops  Obesity class I Body mass index is 30.24 kg/m. Outpatient follow up for lifestyle modification and risk factor management   DVT prophylaxis: Lovenox  SQ   Code Status: Full Code Disposition:  Home  Consultants:  Treatment Team:  Consulting Physician: Pccm, Fenton, MD Consulting Physician: Darron Deatrice LABOR, MD  Procedures:  [facility] 01/24  Antimicrobials:  Anti-infectives (From admission, onward)    None       Data Reviewed: I have personally reviewed following labs and imaging studies CBC: Recent Labs  Lab 06/20/24 0849 06/20/24 2321 06/21/24 0121  WBC 5.2 10.1 11.0*  HGB 14.3 14.2 14.2  HCT  45.5 41.7 40.5  MCV 84.3 79.6* 78.2*  PLT 169 183 179   Basic Metabolic Panel: Recent Labs  Lab 06/20/24 0849 06/20/24 1347 06/20/24 2321 06/21/24 0121 06/21/24 0406  NA 138  --   --  138 138  K 4.2  --   --  3.7 3.6  CL 105  --   --  102 103  CO2 21*  --   --  24 24  GLUCOSE 172*  --   --  173* 169*  BUN 18  --   --  17 17  CREATININE 1.22  --  1.11 1.18 1.21  CALCIUM  9.1  --   --  9.3 9.1  MG  --  1.9  --   --   --    GFR: Estimated Creatinine Clearance: 64 mL/min (by C-G formula based on SCr of 1.21 mg/dL). Liver Function Tests: No results for input(s): AST, ALT, ALKPHOS, BILITOT, PROT, ALBUMIN in the last 168 hours. CBG: Recent Labs  Lab 06/20/24 1719 06/20/24 2346 06/21/24 0812  GLUCAP 163* 167* 161*    No results found for this or any previous visit (from the past 240 hours).   Radiology Studies: CARDIAC CATHETERIZATION Result Date: 06/20/2024   Prox LAD to Mid LAD lesion is 30% stenosed.   Dist Cx lesion is 100% stenosed.   Balloon angioplasty was performed using a BALLOON TREK RX [id number].   Post intervention, there is a 20% residual stenosis.   There is mild left ventricular systolic dysfunction.   LV end diastolic pressure is mildly elevated.   The left ventricular ejection fraction is 45-50% by visual estimate.   As long as the patient continues to meet low risk STEMI criteria and in the absence of any other complications or medical issues, we expect the patient to be ready for discharge on 06/22/2024.   Recommend uninterrupted dual antiplatelet therapy with Aspirin  81mg  daily and Ticagrelor  90mg  twice daily for a minimum of 12 months (ACS-Class I recommendation). 1.  Codominant coronary arteries with severe one-vessel coronary artery disease.  The culprit is an occluded distal left circumflex supplying a relatively small distribution.  The left main and the proximal portions of the LAD and left circumflex are very ectatic. 2.  Mildly reduced LV systolic  function with mildly elevated left ventricular end-diastolic pressure at 20 mmHg. 3.  Successful balloon angioplasty to the distal left circumflex.  I elected not to place a stent due to long lesion with small vessel diameter distally and small distal distribution. 4.  Difficult procedure via the right radial artery due to significant tortuosity of the innominate artery that required placing a long sheath. Recommendations: This was a non-STEMI with ongoing chest pain and borderline inferior ST elevation that did not meet criteria for STEMI.   CT Angio Chest/Abd/Pel for Dissection W and/or Wo Contrast Result Date: 06/20/2024 EXAM: CTA CHEST, ABDOMEN AND PELVIS WITH AND WITHOUT CONTRAST 06/20/2024 09:08:52 AM TECHNIQUE: CTA of the chest was performed with and without the administration of intravenous contrast. CTA of the abdomen and pelvis was performed with and  without the administration of intravenous contrast. 100 mL of iohexol  (OMNIPAQUE ) 350 MG/ML injection was administered. Multiplanar reformatted images are provided for review. MIP images are provided for review. Automated exposure control, iterative reconstruction, and/or weight based adjustment of the mA/kV was utilized to reduce the radiation dose to as low as reasonably achievable. COMPARISON: 10/07/2021 CLINICAL HISTORY: CP, HTN, back pain. Chest pain, hypertension, back pain. FINDINGS: VASCULATURE: AORTA: No acute finding. No abdominal aortic aneurysm. No dissection. PULMONARY ARTERIES: No pulmonary embolism with the limits of this exam. GREAT VESSELS OF AORTIC ARCH: No acute finding. No dissection. No arterial occlusion or significant stenosis. CELIAC TRUNK: No acute finding. No occlusion or significant stenosis. SUPERIOR MESENTERIC ARTERY: No acute finding. No occlusion or significant stenosis. INFERIOR MESENTERIC ARTERY: No acute finding. No occlusion or significant stenosis. RENAL ARTERIES: No acute finding. No occlusion or significant stenosis.  ILIAC ARTERIES: Mild calcified plaque in bilateral common and internal iliac arteries without aneurysm or stenosis. Mild iliac tortuosity. CHEST: MEDIASTINUM: Stable partially calcified small AP window lymph nodes. The heart and pericardium demonstrate no acute abnormality. LUNGS AND PLEURA: Small blebs in posterior basal segment right lower lobe, and both lung apices. No focal consolidation or pulmonary edema. No evidence of pleural effusion or pneumothorax. THORACIC BONES AND SOFT TISSUES: Vertebral endplate spurring at multiple levels in the lower thoracic spine. No acute soft tissue abnormality. ABDOMEN AND PELVIS: LIVER: The liver is unremarkable. GALLBLADDER AND BILE DUCTS: Gallbladder is unremarkable. No biliary ductal dilatation. SPLEEN: The spleen is unremarkable. PANCREAS: The pancreas is unremarkable. ADRENAL GLANDS: Stable 2.3 cm right adrenal adenoma. KIDNEYS, URETERS AND BLADDER: No stones in the kidneys or ureters. No hydronephrosis. No perinephric or periureteral stranding. Prostate enlargement, protruding into the lumen of the urinary bladder. GI AND BOWEL: Stomach and duodenal sweep demonstrate no acute abnormality. Normal appendix. There is no bowel obstruction. No abnormal bowel wall thickening or distension. REPRODUCTIVE: Prostate enlargement, protruding into the lumen of the urinary bladder. PERITONEUM AND RETROPERITONEUM: No ascites or free air. LYMPH NODES: Stable partially calcified small AP window lymph nodes. No other lymphadenopathy. ABDOMINAL BONES AND SOFT TISSUES: Lumbar spondylotic change L3-S1. Bilateral inguinal hernias containing only fat. Bilateral pelvic phleboliths. IMPRESSION: 1. No acute findings. Electronically signed by: Katheleen Faes MD 06/20/2024 09:29 AM EST RP Workstation: [id number]   DG Chest 2 View Result Date: 06/20/2024 EXAM: 2 VIEW(S) XRAY OF THE CHEST 06/20/2024 08:58:00 AM COMPARISON: Comparison 10/18/2021. CLINICAL HISTORY: Chest pain. FINDINGS: LUNGS AND  PLEURA: Low lung volumes. Right hemidiaphragm eventration. Bronchovascular crowding in both lung bases. No focal pulmonary opacity. No pleural effusion. No pneumothorax. HEART AND MEDIASTINUM: No acute abnormality of the cardiac and mediastinal silhouettes. BONES AND SOFT TISSUES: Mild thoracic spondylosis. No acute osseous abnormality. IMPRESSION: 1. No acute findings. 2. Low lung volumes with right hemidiaphragm eventration. Electronically signed by: Dayne Hassell MD 06/20/2024 09:21 AM EST RP Workstation: [id number]    Scheduled Meds:  aspirin  EC  81 mg Oral Daily   atorvastatin   80 mg Oral Daily   Chlorhexidine  Gluconate Cloth  6 each Topical Daily   dorzolamide -timolol   1 drop Both Eyes BID   enoxaparin  (LOVENOX ) injection  50 mg Subcutaneous Q24H   insulin  aspart  0-5 Units Subcutaneous QHS   insulin  aspart  0-9 Units Subcutaneous TID WC   latanoprost   1 drop Both Eyes QHS   levothyroxine   75 mcg Oral Daily   lidocaine   1 patch Transdermal Q24H   sodium chloride  flush  3 mL Intravenous Q12H  sodium chloride  flush  3 mL Intravenous Q12H   ticagrelor   90 mg Oral BID   Continuous Infusions:  sodium chloride        LOS: 1 day  MDM: Patient is high risk for one or more organ failure.  They necessitate ongoing hospitalization for continued IV therapies and subsequent lab monitoring. Total time spent interpreting labs and vitals, reviewing the medical record, coordinating care amongst consultants and care team members, directly assessing and discussing care with the patient and/or family: 55 min Laree Lock, MD [facility]  To contact the attending physician between 7A-7P please use Epic Chat. To contact the covering physician during after hours 7P-7A, please review Amion.  06/21/2024, 8:44 AM   *This document has been created with the assistance of dictation software. Please excuse typographical errors. *   "

## 2024-06-22 ENCOUNTER — Other Ambulatory Visit: Payer: Self-pay

## 2024-06-22 ENCOUNTER — Other Ambulatory Visit (HOSPITAL_COMMUNITY): Payer: Self-pay

## 2024-06-22 ENCOUNTER — Encounter: Payer: Self-pay | Admitting: Cardiovascular Disease

## 2024-06-22 DIAGNOSIS — I214 Non-ST elevation (NSTEMI) myocardial infarction: Secondary | ICD-10-CM | POA: Diagnosis not present

## 2024-06-22 LAB — MRSA NEXT GEN BY PCR, NASAL: MRSA by PCR Next Gen: NOT DETECTED

## 2024-06-22 LAB — CBC
HCT: 38.5 % — ABNORMAL LOW (ref 39.0–52.0)
Hemoglobin: 13.1 g/dL (ref 13.0–17.0)
MCH: 26.8 pg (ref 26.0–34.0)
MCHC: 34 g/dL (ref 30.0–36.0)
MCV: 78.9 fL — ABNORMAL LOW (ref 80.0–100.0)
Platelets: 162 10*3/uL (ref 150–400)
RBC: 4.88 MIL/uL (ref 4.22–5.81)
RDW: 14.6 % (ref 11.5–15.5)
WBC: 11.8 10*3/uL — ABNORMAL HIGH (ref 4.0–10.5)
nRBC: 0 % (ref 0.0–0.2)

## 2024-06-22 LAB — BASIC METABOLIC PANEL WITH GFR
Anion gap: 10 (ref 5–15)
BUN: 16 mg/dL (ref 8–23)
CO2: 24 mmol/L (ref 22–32)
Calcium: 8.7 mg/dL — ABNORMAL LOW (ref 8.9–10.3)
Chloride: 102 mmol/L (ref 98–111)
Creatinine, Ser: 1.1 mg/dL (ref 0.61–1.24)
GFR, Estimated: >60 mL/min
Glucose, Bld: 142 mg/dL — ABNORMAL HIGH (ref 70–99)
Potassium: 3.8 mmol/L (ref 3.5–5.1)
Sodium: 137 mmol/L (ref 135–145)

## 2024-06-22 LAB — GLUCOSE, CAPILLARY
Glucose-Capillary: 145 mg/dL — ABNORMAL HIGH (ref 70–99)
Glucose-Capillary: 188 mg/dL — ABNORMAL HIGH (ref 70–99)
Glucose-Capillary: 135 mg/dL — ABNORMAL HIGH (ref 70–99)

## 2024-06-22 MED ORDER — TICAGRELOR 90 MG PO TABS
90.0000 mg | ORAL_TABLET | Freq: Two times a day (BID) | ORAL | 1 refills | Status: AC
  Filled 2024-06-22: qty 60, 30d supply, fill #0

## 2024-06-22 MED ORDER — CARVEDILOL 3.125 MG PO TABS
3.1250 mg | ORAL_TABLET | Freq: Two times a day (BID) | ORAL | 1 refills | Status: AC
  Filled 2024-06-22: qty 60, 30d supply, fill #0

## 2024-06-22 MED ORDER — ACETAMINOPHEN 325 MG PO TABS
650.0000 mg | ORAL_TABLET | Freq: Four times a day (QID) | ORAL | 0 refills | Status: AC | PRN
  Filled 2024-06-22: qty 30, 4d supply, fill #0

## 2024-06-22 MED ORDER — ATORVASTATIN CALCIUM 80 MG PO TABS
80.0000 mg | ORAL_TABLET | Freq: Every day | ORAL | 1 refills | Status: AC
  Filled 2024-06-22: qty 30, 30d supply, fill #0

## 2024-06-22 MED ORDER — CARVEDILOL 3.125 MG PO TABS: 3.1250 mg | ORAL_TABLET | Freq: Two times a day (BID) | ORAL | Status: DC

## 2024-06-22 MED ORDER — LIDOCAINE 5 % EX PTCH
1.0000 | MEDICATED_PATCH | CUTANEOUS | 0 refills | Status: AC
  Filled 2024-06-22: qty 30, 30d supply, fill #0

## 2024-06-22 MED ORDER — ASPIRIN EC 81 MG PO TBEC
81.0000 mg | DELAYED_RELEASE_TABLET | Freq: Every day | ORAL | 1 refills | Status: AC
  Filled 2024-06-22: qty 30, 30d supply, fill #0

## 2024-06-22 NOTE — Discharge Summary (Signed)
 " Physician Discharge Summary   Patient: Donald Le MRN: [id number] DOB: Feb 27, 1951  Admit date:     06/20/2024  Discharge date: 06/22/24  Discharge Physician: Laree Lock   PCP: Vicci Duwaine SQUIBB, DO   Recommendations at discharge:   PCP outpatient within 1 week -monitor BMP, CBC Monitor BP -can resume low-dose losartan  as tolerated  Follow-up with cardiology outpatient within 1 week Cardiac rehab referral provided  Discharge Diagnoses: Principal Problem:   NSTEMI (non-ST elevated myocardial infarction) [facility]) Active Problems:   Chest pain   Hypertension associated with diabetes (HCC)   Elevated PSA   BPH (benign prostatic hyperplasia)   Hyperlipidemia associated with type 2 diabetes mellitus (HCC)   Diabetes mellitus, type 2 (HCC)   Acquired hypothyroidism   Primary osteoarthritis   Postprocedural hypotension  Hospital Course: 55M with PMH of T2DM, HTN, HLD, acquired hypothyroidism, and remote seizures, presented via EMS with acute-onset chest pressure radiating to L arm, upper back, and interscapular region with associated symptoms of nausea, vomiting, and orthostatic hypotension.  Patient admitted for chest pain, had ischemic symptoms and uptrending troponin, underwent urgent LHC 01/24 with 100% distal LCx occlusion s/p balloon angioplasty.  PCCM was consulted for possible pressor requirement, blood pressure remained stable and downgraded to TRH. Hospital course as below   NSTEMI - s/p Balloon Angioplasty to Distal Lcx CAD Underwent urgent LHC 01/24 100% distal LCx occlusion with thrombus, s/p balloon angioplasty, no stent (vessel was fairly small). 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  Mild LVH.  Grade  1 DD. Low-dose Coreg  Held ACE/ARB, can resume as blood pressure tolerates Euvolemic on exam   HTN Hold home amlodipine , hydrochlorothiazide , losartan  Resume losartan  as blood pressure tolerates   Hyperlipidemia Increase atorvastatin  20 mg to 80 mg daily   Mild leukocytosis Likely reactive, No symptoms/signs of infection Negative for pneumonia Monitor WBC outpatient within 1 week   Remote Seizure Disorder Seizure precautions No AED therapy indicated d/c Tramadol  risk of lowering seizure threshold   Back pain - musculoskeletal Has been having bilateral upper back pain, worse with lifting anything and relieved with rest. CTA negative, dissection ruled out. Pain management with heating pads, lidocaine  patch, Tylenol    Obesity class I Body mass index is 30.24 kg/m. Outpatient follow up for lifestyle modification and risk factor management    Pain control - [place]  Controlled Substance Reporting System database was reviewed. and patient was instructed, not to drive, operate heavy machinery, perform activities at heights, swimming or participation in water  activities or provide baby-sitting services while on Pain, Sleep and Anxiety Medications; until their outpatient Physician has advised to do so again. Also recommended to not to take more than prescribed Pain, Sleep and Anxiety Medications.  Consultants: Cardiology, PCCM Procedures performed: [facility] 01/24  Disposition: Home Diet recommendation:  Discharge Diet Orders (From admission, onward)     Start     Ordered   06/22/24 0000  Diet - low sodium heart healthy        06/22/24 1016            DISCHARGE MEDICATION: Allergies as of 06/22/2024       Reactions   Ace Inhibitors Other (See Comments)   ARF  Medication List     PAUSE taking these medications    losartan  100 MG tablet Wait to take this until your doctor or other care provider tells you to start again. Commonly known as: COZAAR  Take 1  tablet (100 mg total) by mouth daily.       STOP taking these medications    amLODipine  10 MG tablet Commonly known as: NORVASC    hydrochlorothiazide  25 MG tablet Commonly known as: HYDRODIURIL    meloxicam 15 MG tablet Commonly known as: MOBIC   traMADol  50 MG tablet Commonly known as: ULTRAM        TAKE these medications    acetaminophen  325 MG tablet Commonly known as: TYLENOL  Take 2 tablets (650 mg total) by mouth every 6 (six) hours as needed for mild pain (pain score 1-3) or fever (or Fever >/= 101).   aspirin  EC 81 MG tablet Take 1 tablet (81 mg total) by mouth daily.   atorvastatin  80 MG tablet Commonly known as: LIPITOR  Take 1 tablet (80 mg total) by mouth daily. Start taking on: June 23, 2024 What changed:  medication strength how much to take   carvedilol  3.125 MG tablet Commonly known as: COREG  Take 1 tablet (3.125 mg total) by mouth 2 (two) times daily with a meal.   dorzolamide -timolol  2-0.5 % ophthalmic solution Commonly known as: COSOPT  1 drop 2 (two) times daily.   Fish Oil 1000 MG Caps Take 1,000 mg by mouth daily.   latanoprost  0.005 % ophthalmic solution Commonly known as: XALATAN  1 drop at bedtime.   levothyroxine  75 MCG tablet Commonly known as: SYNTHROID  Take 1 tablet (75 mcg total) by mouth daily.   lidocaine  5 % Commonly known as: LIDODERM  Place 1 patch onto the skin daily. Remove & Discard patch within 12 hours or as directed by MD   metFORMIN  500 MG tablet Commonly known as: GLUCOPHAGE  Take 1 tablet (500 mg total) by mouth 2 (two) times daily with a meal.   ticagrelor  90 MG Tabs tablet Commonly known as: BRILINTA  Take 1 tablet (90 mg total) by mouth 2 (two) times daily.        Discharge Exam: Filed Weights   06/20/24 2021 06/20/24 2251 06/22/24 0500  Weight: 88.9 kg 95.6 kg 95.3 kg    General: No acute distress. Cardio: regular rhythm, normal rate, S1, S2, no M/R/G,  Lungs: CTAB, no increased WOB Ext: No  Edema, cap refill  Neuro: No gross focal deficit Psych:  Mood and Affect: Mood normal.  Abd: Soft: BS x 4, NTND Skin: Intact, warm, no rashes lesion, or ulcer  Condition at discharge: good  The results of significant diagnostics from this hospitalization (including imaging, microbiology, ancillary and laboratory) are listed below for reference.   Imaging Studies: ECHOCARDIOGRAM COMPLETE Result Date: 06/21/2024    ECHOCARDIOGRAM REPORT   Patient Name:   KEMOND AMORIN Date of Exam: 06/21/2024 Medical Rec #:  [id number]           Height:       70.1 in Accession #:    [id number]          Weight:       210.8 lb Date of Birth:  1950/08/19            BSA:          2.136 m Patient Age:    73 years            BP:           99/60  mmHg Patient Gender: M                   HR:           52 bpm. Exam Location:  [facility] Procedure: 2D Echo, Cardiac Doppler, Color Doppler, Strain Analysis, 3D Echo and            Intracardiac Opacification Agent (Both Spectral and Color Flow            Doppler were utilized during procedure). Indications:     Chest pain R07.9  History:         Patient has no prior history of Echocardiogram examinations.                  Risk Factors:Hypertension and Dyslipidemia.  Sonographer:     Christopher Furnace Referring Phys:  [id number] FLYJFFJI A ARIDA Diagnosing Phys: Annabella Scarce MD  Sonographer Comments: Suboptimal apical window. Global longitudinal strain was attempted. IMPRESSIONS  1. Left ventricular ejection fraction, by estimation, is 55 to 60%. Left ventricular ejection fraction by 3D volume is 56 %. The left ventricle has normal function. The left ventricle has no regional wall motion abnormalities. There is mild left ventricular hypertrophy. Left ventricular diastolic parameters are consistent with Grade I diastolic dysfunction (impaired relaxation). The average left ventricular global longitudinal strain is -14.3 %. The global longitudinal strain is abnormal.  2. Right ventricular systolic  function is normal. The right ventricular size is normal.  3. The mitral valve is normal in structure. No evidence of mitral valve regurgitation. No evidence of mitral stenosis.  4. The aortic valve was not well visualized. Aortic valve regurgitation is not visualized. No aortic stenosis is present.  5. The inferior vena cava is normal in size with greater than 50% respiratory variability, suggesting right atrial pressure of 3 mmHg. FINDINGS  Left Ventricle: Left ventricular ejection fraction, by estimation, is 55 to 60%. Left ventricular ejection fraction by 3D volume is 56 %. The left ventricle has normal function. The left ventricle has no regional wall motion abnormalities. Definity  contrast agent was given IV to delineate the left ventricular endocardial borders. The average left ventricular global longitudinal strain is -14.3 %. Strain was performed and the global longitudinal strain is abnormal. The left ventricular internal cavity size was normal in size. There is mild left ventricular hypertrophy. Left ventricular diastolic parameters are consistent with Grade I diastolic dysfunction (impaired relaxation). Indeterminate filling pressures. Right Ventricle: The right ventricular size is normal. No increase in right ventricular wall thickness. Right ventricular systolic function is normal. Left Atrium: Left atrial size was normal in size. Right Atrium: Right atrial size was normal in size. Pericardium: There is no evidence of pericardial effusion. Mitral Valve: The mitral valve is normal in structure. No evidence of mitral valve regurgitation. No evidence of mitral valve stenosis. MV peak gradient, 5.0 mmHg. The mean mitral valve gradient is 2.0 mmHg. Tricuspid Valve: The tricuspid valve is normal in structure. Tricuspid valve regurgitation is trivial. No evidence of tricuspid stenosis. Aortic Valve: The aortic valve was not well visualized. Aortic valve regurgitation is not visualized. No aortic stenosis is  present. Aortic valve mean gradient measures 3.0 mmHg. Aortic valve peak gradient measures 5.8 mmHg. Aortic valve area, by VTI measures 2.45 cm. Pulmonic Valve: The pulmonic valve was normal in structure. Pulmonic valve regurgitation is not visualized. No evidence of pulmonic stenosis. Aorta: The aortic root is normal in size and structure. Venous: The inferior vena cava is normal in  size with greater than 50% respiratory variability, suggesting right atrial pressure of 3 mmHg. IAS/Shunts: No atrial level shunt detected by color flow Doppler. Additional Comments: 3D was performed not requiring image post processing on an independent workstation and was normal.  LEFT VENTRICLE PLAX 2D LVIDd:         4.20 cm         Diastology LVIDs:         3.10 cm         LV e' medial:    8.49 cm/s LV PW:         1.20 cm         LV E/e' medial:  10.6 LV IVS:        1.00 cm         LV e' lateral:   9.57 cm/s LVOT diam:     2.00 cm         LV E/e' lateral: 9.4 LV SV:         56 LV SV Index:   26              2D Longitudinal LVOT Area:     3.14 cm        Strain LV IVRT:       95 msec         2D Strain GLS   -11.9 %                                (A4C):                                2D Strain GLS   -14.6 %                                (A3C):                                2D Strain GLS   -16.4 %                                (A2C):                                2D Strain GLS   -14.3 %                                Avg:                                 3D Volume EF                                LV 3D EF:    Left                                             ventricul  ar                                             ejection                                             fraction                                             by 3D                                             volume is                                             56 %.                                 3D Volume EF:                                 3D EF:        56 %                                LV EDV:       282 ml                                LV ESV:       125 ml                                LV SV:        156 ml RIGHT VENTRICLE RV Basal diam:  3.10 cm RV Mid diam:    3.20 cm RV S prime:     10.00 cm/s TAPSE (M-mode): 2.5 cm LEFT ATRIUM             Index        RIGHT ATRIUM          Index LA diam:        2.10 cm 0.98 cm/m   RA Area:     8.99 cm LA Vol (A2C):   42.1 ml 19.71 ml/m  RA Volume:   18.00 ml 8.43 ml/m LA Vol (A4C):   42.0 ml 19.66 ml/m LA Biplane Vol: 44.6 ml 20.88 ml/m  AORTIC VALVE AV Area (Vmax):    2.24 cm AV Area (Vmean):   2.05 cm AV Area (VTI):     2.45 cm AV Vmax:           120.00 cm/s AV Vmean:  81.800 cm/s AV VTI:            0.227 m AV Peak Grad:      5.8 mmHg AV Mean Grad:      3.0 mmHg LVOT Vmax:         85.40 cm/s LVOT Vmean:        53.500 cm/s LVOT VTI:          0.177 m LVOT/AV VTI ratio: 0.78  AORTA Ao Root diam: 3.10 cm MITRAL VALVE                TRICUSPID VALVE MV Area (PHT): 4.36 cm     TR Peak grad:   7.3 mmHg MV Area VTI:   1.36 cm     TR Vmax:        135.00 cm/s MV Peak grad:  5.0 mmHg MV Mean grad:  2.0 mmHg     SHUNTS MV Vmax:       1.12 m/s     Systemic VTI:  0.18 m MV Vmean:      63.5 cm/s    Systemic Diam: 2.00 cm MV Decel Time: 174 msec MV E velocity: 89.60 cm/s MV A velocity: 101.00 cm/s MV E/A ratio:  0.89 Annabella Scarce MD Electronically signed by Annabella Scarce MD Signature Date/Time: 06/21/2024/2:02:49 PM    Final    CARDIAC CATHETERIZATION Result Date: 06/20/2024   Prox LAD to Mid LAD lesion is 30% stenosed.   Dist Cx lesion is 100% stenosed.   Balloon angioplasty was performed using a BALLOON TREK RX [id number].   Post intervention, there is a 20% residual stenosis.   There is mild left ventricular systolic dysfunction.   LV end diastolic pressure is mildly elevated.   The left ventricular ejection fraction is 45-50% by visual estimate.   As long as the patient continues to  meet low risk STEMI criteria and in the absence of any other complications or medical issues, we expect the patient to be ready for discharge on 06/22/2024.   Recommend uninterrupted dual antiplatelet therapy with Aspirin  81mg  daily and Ticagrelor  90mg  twice daily for a minimum of 12 months (ACS-Class I recommendation). 1.  Codominant coronary arteries with severe one-vessel coronary artery disease.  The culprit is an occluded distal left circumflex supplying a relatively small distribution.  The left main and the proximal portions of the LAD and left circumflex are very ectatic. 2.  Mildly reduced LV systolic function with mildly elevated left ventricular end-diastolic pressure at 20 mmHg. 3.  Successful balloon angioplasty to the distal left circumflex.  I elected not to place a stent due to long lesion with small vessel diameter distally and small distal distribution. 4.  Difficult procedure via the right radial artery due to significant tortuosity of the innominate artery that required placing a long sheath. Recommendations: This was a non-STEMI with ongoing chest pain and borderline inferior ST elevation that did not meet criteria for STEMI.   CT Angio Chest/Abd/Pel for Dissection W and/or Wo Contrast Result Date: 06/20/2024 EXAM: CTA CHEST, ABDOMEN AND PELVIS WITH AND WITHOUT CONTRAST 06/20/2024 09:08:52 AM TECHNIQUE: CTA of the chest was performed with and without the administration of intravenous contrast. CTA of the abdomen and pelvis was performed with and without the administration of intravenous contrast. 100 mL of iohexol  (OMNIPAQUE ) 350 MG/ML injection was administered. Multiplanar reformatted images are provided for review. MIP images are provided for review. Automated exposure control, iterative reconstruction, and/or weight based adjustment of the mA/kV was utilized  to reduce the radiation dose to as low as reasonably achievable. COMPARISON: 10/07/2021 CLINICAL HISTORY: CP, HTN, back pain. Chest  pain, hypertension, back pain. FINDINGS: VASCULATURE: AORTA: No acute finding. No abdominal aortic aneurysm. No dissection. PULMONARY ARTERIES: No pulmonary embolism with the limits of this exam. GREAT VESSELS OF AORTIC ARCH: No acute finding. No dissection. No arterial occlusion or significant stenosis. CELIAC TRUNK: No acute finding. No occlusion or significant stenosis. SUPERIOR MESENTERIC ARTERY: No acute finding. No occlusion or significant stenosis. INFERIOR MESENTERIC ARTERY: No acute finding. No occlusion or significant stenosis. RENAL ARTERIES: No acute finding. No occlusion or significant stenosis. ILIAC ARTERIES: Mild calcified plaque in bilateral common and internal iliac arteries without aneurysm or stenosis. Mild iliac tortuosity. CHEST: MEDIASTINUM: Stable partially calcified small AP window lymph nodes. The heart and pericardium demonstrate no acute abnormality. LUNGS AND PLEURA: Small blebs in posterior basal segment right lower lobe, and both lung apices. No focal consolidation or pulmonary edema. No evidence of pleural effusion or pneumothorax. THORACIC BONES AND SOFT TISSUES: Vertebral endplate spurring at multiple levels in the lower thoracic spine. No acute soft tissue abnormality. ABDOMEN AND PELVIS: LIVER: The liver is unremarkable. GALLBLADDER AND BILE DUCTS: Gallbladder is unremarkable. No biliary ductal dilatation. SPLEEN: The spleen is unremarkable. PANCREAS: The pancreas is unremarkable. ADRENAL GLANDS: Stable 2.3 cm right adrenal adenoma. KIDNEYS, URETERS AND BLADDER: No stones in the kidneys or ureters. No hydronephrosis. No perinephric or periureteral stranding. Prostate enlargement, protruding into the lumen of the urinary bladder. GI AND BOWEL: Stomach and duodenal sweep demonstrate no acute abnormality. Normal appendix. There is no bowel obstruction. No abnormal bowel wall thickening or distension. REPRODUCTIVE: Prostate enlargement, protruding into the lumen of the urinary  bladder. PERITONEUM AND RETROPERITONEUM: No ascites or free air. LYMPH NODES: Stable partially calcified small AP window lymph nodes. No other lymphadenopathy. ABDOMINAL BONES AND SOFT TISSUES: Lumbar spondylotic change L3-S1. Bilateral inguinal hernias containing only fat. Bilateral pelvic phleboliths. IMPRESSION: 1. No acute findings. Electronically signed by: Katheleen Faes MD 06/20/2024 09:29 AM EST RP Workstation: [id number]   DG Chest 2 View Result Date: 06/20/2024 EXAM: 2 VIEW(S) XRAY OF THE CHEST 06/20/2024 08:58:00 AM COMPARISON: Comparison 10/18/2021. CLINICAL HISTORY: Chest pain. FINDINGS: LUNGS AND PLEURA: Low lung volumes. Right hemidiaphragm eventration. Bronchovascular crowding in both lung bases. No focal pulmonary opacity. No pleural effusion. No pneumothorax. HEART AND MEDIASTINUM: No acute abnormality of the cardiac and mediastinal silhouettes. BONES AND SOFT TISSUES: Mild thoracic spondylosis. No acute osseous abnormality. IMPRESSION: 1. No acute findings. 2. Low lung volumes with right hemidiaphragm eventration. Electronically signed by: Katheleen Faes MD 06/20/2024 09:21 AM EST RP Workstation: [id number]    Microbiology: Results for orders placed or performed during the hospital encounter of 06/20/24  MRSA Next Gen by PCR, Nasal     Status: None   Collection Time: 06/20/24 10:49 PM   Specimen: Nasal Mucosa; Nasal Swab  Result Value Ref Range Status   MRSA by PCR Next Gen NOT DETECTED NOT DETECTED Final    Comment: (NOTE) The GeneXpert MRSA Assay (FDA approved for NASAL specimens only), is one component of a comprehensive MRSA colonization surveillance program. It is not intended to diagnose MRSA infection nor to guide or monitor treatment for MRSA infections. Test performance is not FDA approved in patients less than 71 years old. Performed at [facility], [address] [place], [place] [address]     Labs: CBC: Recent Labs  Lab 06/20/24 [phone] 06/20/24 2321  06/21/24 0121 06/22/24 9590  WBC 5.2 10.1 11.0* 11.8*  HGB 14.3 14.2 14.2 13.1  HCT 45.5 41.7 40.5 38.5*  MCV 84.3 79.6* 78.2* 78.9*  PLT 169 183 179 162   Basic Metabolic Panel: Recent Labs  Lab 06/20/24 0849 06/20/24 1347 06/20/24 2321 06/21/24 0121 06/21/24 0406 06/22/24 0409  NA 138  --   --  138 138 137  K 4.2  --   --  3.7 3.6 3.8  CL 105  --   --  102 103 102  CO2 21*  --   --  24 24 24   GLUCOSE 172*  --   --  173* 169* 142*  BUN 18  --   --  17 17 16   CREATININE 1.22  --  1.11 1.18 1.21 1.10  CALCIUM  9.1  --   --  9.3 9.1 8.7*  MG  --  1.9  --   --   --   --    Liver Function Tests: No results for input(s): AST, ALT, ALKPHOS, BILITOT, PROT, ALBUMIN in the last 168 hours. CBG: Recent Labs  Lab 06/21/24 0812 06/21/24 1109 06/21/24 1536 06/21/24 2126 06/22/24 0818  GLUCAP 161* 154* 140* 125* 188*    Discharge time spent: greater than 30 minutes.  Signed: Laree Lock, MD [facility] 06/22/2024 "

## 2024-06-22 NOTE — Discharge Instructions (Addendum)
" °  [facility]  Address: [address] [place], [place], [place] [address] Phone: [phone]  Address:[address], [place], [place] [address]  Main Phone Number:[phone] Office Hours: Monday - Friday, **8:00 AM - 5:00 PM** (closed on holidays)  This is the local Department of Social Services (DSS) for [facility], where you can apply for food stamps (SNAP), Medicaid, assistance with rent or utilities, child support and welfare services, and more.     "

## 2024-06-22 NOTE — Progress Notes (Signed)
 "  Progress Note  Patient Name: Donald Le Date of Encounter: 06/22/2024  Primary Cardiologist: New - consult by Darron  Subjective   He is doing well with no chest pain, shortness of breath or back pain.  Inpatient Medications    Scheduled Meds:  aspirin  EC  81 mg Oral Daily   atorvastatin   80 mg Oral Daily   carvedilol   3.125 mg Oral BID WC   Chlorhexidine  Gluconate Cloth  6 each Topical Daily   dorzolamide -timolol   1 drop Both Eyes BID   enoxaparin  (LOVENOX ) injection  50 mg Subcutaneous Q24H   insulin  aspart  0-5 Units Subcutaneous QHS   insulin  aspart  0-9 Units Subcutaneous TID WC   latanoprost   1 drop Both Eyes QHS   levothyroxine   75 mcg Oral Daily   lidocaine   1 patch Transdermal Q24H   sodium chloride  flush  3 mL Intravenous Q12H   sodium chloride  flush  3 mL Intravenous Q12H   ticagrelor   90 mg Oral BID   Continuous Infusions:   PRN Meds: acetaminophen  **OR** acetaminophen , alum & mag hydroxide-simeth, ondansetron  (ZOFRAN ) IV, oxyCODONE , senna-docusate, sodium chloride  flush   Vital Signs    Vitals:   06/22/24 0400 06/22/24 0500 06/22/24 0628 06/22/24 0700  BP: 112/71 115/70 99/64 114/79  Pulse: 70 60 62 64  Resp: (!) 26 18 17 17   Temp:      TempSrc:      SpO2: 98% 97% 96% 98%  Weight:  95.3 kg    Height:        Intake/Output Summary (Last 24 hours) at 06/22/2024 0852 Last data filed at 06/21/2024 1400 Gross per 24 hour  Intake --  Output 750 ml  Net -750 ml   Filed Weights   06/20/24 2021 06/20/24 2251 06/22/24 0500  Weight: 88.9 kg 95.6 kg 95.3 kg    Telemetry    Sinus rhythm with sinus bradycardia- Personally Reviewed  ECG    Sinus bradycardia, 54 bpm, inferolateral TWI - Personally Reviewed  Physical Exam   GENERAL:  Well appearing HEENT: Pupils equal round and reactive, fundi not visualized, oral mucosa unremarkable NECK:  No jugular venous distention, waveform within normal limits, carotid upstroke brisk and symmetric, no  bruits, no thyromegaly LUNGS:  Clear to auscultation bilaterally HEART:  RRR.  PMI not displaced or sustained,S1 and S2 within normal limits, no S3, no S4, no clicks, no rubs, no murmurs ABD:  Flat, positive bowel sounds normal in frequency in pitch, no bruits, no rebound, no guarding, no midline pulsatile mass, no hepatomegaly, no splenomegaly EXT:  2 plus pulses throughout, no edema, no cyanosis no clubbing SKIN:  No rashes no nodules  Right radial pulses normal with no hematoma.   Labs    Chemistry Recent Labs  Lab 06/21/24 0121 06/21/24 0406 06/22/24 0409  NA 138 138 137  K 3.7 3.6 3.8  CL 102 103 102  CO2 24 24 24   GLUCOSE 173* 169* 142*  BUN 17 17 16   CREATININE 1.18 1.21 1.10  CALCIUM  9.3 9.1 8.7*  GFRNONAA >60 >60 >60  ANIONGAP 12 12 10      Hematology Recent Labs  Lab 06/20/24 2321 06/21/24 0121 06/22/24 0409  WBC 10.1 11.0* 11.8*  RBC 5.24 5.18 4.88  HGB 14.2 14.2 13.1  HCT 41.7 40.5 38.5*  MCV 79.6* 78.2* 78.9*  MCH 27.1 27.4 26.8  MCHC 34.1 35.1 34.0  RDW 14.6 14.6 14.6  PLT 183 179 162    Cardiac EnzymesNo results  for input(s): TROPONINI in the last 168 hours. No results for input(s): TROPIPOC in the last 168 hours.   BNP Recent Labs  Lab 06/20/24 1600  PROBNP 101.0     DDimer No results for input(s): DDIMER in the last 168 hours.   Radiology    CT Angio Chest/Abd/Pel for Dissection W and/or Wo Contrast Result Date: 06/20/2024 IMPRESSION: 1. No acute findings. Electronically signed by: Katheleen Faes MD 06/20/2024 09:29 AM EST RP Workstation: [id number]   DG Chest 2 View Result Date: 06/20/2024 IMPRESSION: 1. No acute findings. 2. Low lung volumes with right hemidiaphragm eventration. Electronically signed by: Katheleen Faes MD 06/20/2024 09:21 AM EST RP Workstation: [id number]    Cardiac Studies   LHC 06/20/2024:   Prox LAD to Mid LAD lesion is 30% stenosed.   Dist Cx lesion is 100% stenosed.   Balloon angioplasty was performed  using a BALLOON TREK RX [id number].   Post intervention, there is a 20% residual stenosis.   There is mild left ventricular systolic dysfunction.   LV end diastolic pressure is mildly elevated.   The left ventricular ejection fraction is 45-50% by visual estimate.   As long as the patient continues to meet low risk STEMI criteria and in the absence of any other complications or medical issues, we expect the patient to be ready for discharge on 06/22/2024.   Recommend uninterrupted dual antiplatelet therapy with Aspirin  81mg  daily and Ticagrelor  90mg  twice daily for a minimum of 12 months (ACS-Class I recommendation).   1.  Codominant coronary arteries with severe one-vessel coronary artery disease.  The culprit is an occluded distal left circumflex supplying a relatively small distribution.  The left main and the proximal portions of the LAD and left circumflex are very ectatic. 2.  Mildly reduced LV systolic function with mildly elevated left ventricular end-diastolic pressure at 20 mmHg. 3.  Successful balloon angioplasty to the distal left circumflex.  I elected not to place a stent due to long lesion with small vessel diameter distally and small distal distribution. 4.  Difficult procedure via the right radial artery due to significant tortuosity of the innominate artery that required placing a long sheath.   Recommendations: This was a non-STEMI with ongoing chest pain and borderline inferior ST elevation that did not meet criteria for STEMI.  Patient Profile     74 y.o. male with history of DM2, HTN, HLD, seizure disorder, hypothyroidism, and prior tobacco use quitting in 1983 who was admitted on 06/20/2024 with NSTEMI.   Assessment & Plan    NSTEMI: Cardiac catheterization showed thrombotic occlusion of the distal left circumflex.  This was treated with balloon angioplasty without stent placement given lung disease, small diameter distally and small supply distal distribution.  Continue  aspirin  indefinitely and ticagrelor  for at least 12 months.  The patient was referred to cardiac rehab.    Acute systolic heart failure due to myocardial infarction: Initial EF was 45 to 50% but normal by echocardiogram post revascularization.  He is no longer bradycardic and thus I added small dose carvedilol  3.125 mg twice daily.  He appears to be euvolemic.  No need for an ACE inhibitor or ARB at this time given normal ejection fraction.  If blood pressure starts going up as an outpatient, this can be started.    Hyperlipidemia: LDL 49 this admission.  Now on increased dose of atorvastatin  80 mg daily.   The patient can be discharged home on current cardiac medications.  Will have to ensure  he is able to get all the medications before discharge. I will arrange for follow-up in our office in 1 week.    For questions or updates, please contact [facility] Please consult [email or web address] for contact info under Cardiology/STEMI.    Signed, Deatrice Cage, MD [facility] 06/22/2024, 8:52 AM  "

## 2024-06-22 NOTE — TOC Initial Note (Addendum)
 Transition of Care [facility]) - Initial/Assessment Note    Patient Details  Name: Donald Le MRN: [id number] Date of Birth: [birth year]  [facility] CM/SW Contact:    Donald JINNY Ruts, LCSW Phone Number: 06/22/2024, 10:38 AM  Clinical Narrative:                 Chart reviewed. I was able to speak with the patient via phone call. I introduced myself, my role, and reason for telephone call. The patient reports that he is doing well. The patient reports that he lives In the home with his wife. The patient reports that he is able to complete ADLs independently and he has a full time job.   The patient reports that he drives himself to medical appointments. The patient reports that his wife will assist him during D/C. The patient reports that [facility]. The patient reports that he has never had HH or been admitted in a SNF in the past. The patient reports that he has no equipment in the home.   SW inquired about any needs that the patient has. The patient declined. SW still offered DSS information if needed anything in the future. Patient was accepting.   There are no ICM needs.         Patient Goals and CMS Choice            Expected Discharge Plan and Services         Expected Discharge Date: 06/22/24                                    Prior Living Arrangements/Services                       Activities of Daily Living   ADL Screening (condition at time of admission) Independently performs ADLs?: Yes (appropriate for developmental age)  Permission Sought/Granted                  Emotional Assessment              Admission diagnosis:  NSTEMI (non-ST elevated myocardial infarction) [facility]) [I21.4] Chest pain [R07.9] Chest pain, unspecified type [R07.9] Patient Active Problem List   Diagnosis Date Noted   Postprocedural hypotension 06/21/2024   Chest pain 06/20/2024   NSTEMI (non-ST elevated myocardial infarction) (HCC)  06/20/2024   Primary osteoarthritis 06/25/2023   Acquired hypothyroidism 07/31/2022   Seizure disorder (HCC) 10/12/2021   Adrenal adenoma, right 10/08/2021   Diabetes mellitus, type 2 (HCC) 09/12/2021   Leg cramp 04/19/2020   Hyperlipidemia associated with type 2 diabetes mellitus (HCC) 03/09/2020   Hypertension associated with diabetes (HCC) 03/09/2020   Advanced care planning/counseling discussion 06/19/2017   BPH (benign prostatic hyperplasia) 06/19/2017   Allergic rhinitis, seasonal 06/19/2017   Environmental and seasonal allergies 09/20/2016   Elevated PSA 04/23/2016   PCP:  Vicci Duwaine SQUIBB, DO Pharmacy:   [facility] [phone] [name], [place] - [address] [place] [address] Phone: [phone] Fax: [phone]  [facility] [address] [place] [address] Phone: [phone] Fax: [phone]     Social Drivers of Health (SDOH) Social History: SDOH Screenings   Food Insecurity: No Food Insecurity (06/21/2024)  Housing: Low Risk (06/21/2024)  Transportation Needs: No Transportation Needs (06/21/2024)  Utilities: Not At Risk (06/21/2024)  Alcohol Screen:  Low Risk (10/22/2023)  Depression (PHQ2-9): Low Risk (03/05/2024)  Financial Resource Strain: Low Risk (10/22/2023)  Physical Activity: Sufficiently Active (10/22/2023)  Social Connections: Moderately Integrated (06/21/2024)  Stress: No Stress Concern Present (10/22/2023)  Tobacco Use: Medium Risk (06/20/2024)  Health Literacy: Adequate Health Literacy (10/22/2023)   SDOH Interventions:     Readmission Risk Interventions     No data to display

## 2024-06-22 NOTE — Plan of Care (Signed)
" °  Problem: Education: Goal: Knowledge of General Education information will improve Description: Including pain rating scale, medication(s)/side effects and non-pharmacologic comfort measures Outcome: Progressing   Problem: Health Behavior/Discharge Planning: Goal: Ability to manage health-related needs will improve Outcome: Progressing   Problem: Clinical Measurements: Goal: Ability to maintain clinical measurements within normal limits will improve Outcome: Progressing Goal: Cardiovascular complication will be avoided Outcome: Adequate for Discharge   Problem: Activity: Goal: Risk for activity intolerance will decrease Outcome: Adequate for Discharge   "

## 2024-06-23 ENCOUNTER — Telehealth: Payer: Self-pay | Admitting: Cardiovascular Disease

## 2024-06-23 ENCOUNTER — Ambulatory Visit (INDEPENDENT_AMBULATORY_CARE_PROVIDER_SITE_OTHER): Admitting: Family Medicine

## 2024-06-23 ENCOUNTER — Encounter: Payer: Self-pay | Admitting: Family Medicine

## 2024-06-23 VITALS — BP 109/73 | HR 57 | Temp 97.9°F | Resp 17 | Ht 70.0 in | Wt 203.2 lb

## 2024-06-23 DIAGNOSIS — I499 Cardiac arrhythmia, unspecified: Secondary | ICD-10-CM | POA: Diagnosis not present

## 2024-06-23 DIAGNOSIS — I214 Non-ST elevation (NSTEMI) myocardial infarction: Secondary | ICD-10-CM

## 2024-06-23 DIAGNOSIS — I152 Hypertension secondary to endocrine disorders: Secondary | ICD-10-CM

## 2024-06-23 DIAGNOSIS — I9581 Postprocedural hypotension: Secondary | ICD-10-CM

## 2024-06-23 DIAGNOSIS — E1159 Type 2 diabetes mellitus with other circulatory complications: Secondary | ICD-10-CM

## 2024-06-23 DIAGNOSIS — E1169 Type 2 diabetes mellitus with other specified complication: Secondary | ICD-10-CM

## 2024-06-23 LAB — LIPOPROTEIN A (LPA): Lipoprotein (a): 172.4 nmol/L — ABNORMAL HIGH

## 2024-06-23 NOTE — Progress Notes (Unsigned)
 "  There were no vitals taken for this visit.   Subjective:    Patient ID: Donald Le, male    DOB: [birth year], 74 y.o.   MRN: [id number]  HPI: Donald Le is a 74 y.o. male  No chief complaint on file.  Transition of Care Hospital Follow up.   Hospital/Facility: D/C Physician:  D/C Date:   Records Requested:  Records Received:  Records Reviewed:   Diagnoses on Discharge:  NSTEMI (non-ST elevated myocardial infarction) (HCC)   Chest pain   Hypertension associated with diabetes (HCC)   Elevated PSA   BPH (benign prostatic hyperplasia)   Hyperlipidemia associated with type 2 diabetes mellitus (HCC)   Diabetes mellitus, type 2 (HCC)   Acquired hypothyroidism   Primary osteoarthritis   Postprocedural hypotension  Date of interactive Contact within 48 hours of discharge:  Contact was through: {Blank single:19197::phone,e-mail,direct,other}  Date of 7 day or 14 day face-to-face visit:    {Blank single:19197::within 7 days,within 14 days}  Outpatient Encounter Medications as of 06/23/2024  Medication Sig   acetaminophen  (TYLENOL ) 325 MG tablet Take 2 tablets (650 mg total) by mouth every 6 (six) hours as needed for mild pain (pain score 1-3) or fever (or Fever >/= 101).   aspirin  EC 81 MG tablet Take 1 tablet (81 mg total) by mouth daily.   atorvastatin  (LIPITOR ) 80 MG tablet Take 1 tablet (80 mg total) by mouth daily.   carvedilol  (COREG ) 3.125 MG tablet Take 1 tablet (3.125 mg total) by mouth 2 (two) times daily with a meal.   dorzolamide -timolol  (COSOPT ) 2-0.5 % ophthalmic solution 1 drop 2 (two) times daily.   latanoprost  (XALATAN ) 0.005 % ophthalmic solution 1 drop at bedtime.   levothyroxine  (SYNTHROID ) 75 MCG tablet Take 1 tablet (75 mcg total) by mouth daily.   lidocaine  (LIDODERM ) 5 % Place 1 patch onto the skin daily. Remove & Discard patch within 12 hours or as directed by MD   [Paused] losartan  (COZAAR ) 100 MG tablet Take 1 tablet  (100 mg total) by mouth daily.   metFORMIN  (GLUCOPHAGE ) 500 MG tablet Take 1 tablet (500 mg total) by mouth 2 (two) times daily with a meal.   Omega-3 Fatty Acids (FISH OIL) 1000 MG CAPS Take 1,000 mg by mouth daily.   ticagrelor  (BRILINTA ) 90 MG TABS tablet Take 1 tablet (90 mg total) by mouth 2 (two) times daily.   No facility-administered encounter medications on file as of 06/23/2024.  Per Hospitalist: Hospital Course: 66M with PMH of T2DM, HTN, HLD, acquired hypothyroidism, and remote seizures, presented via EMS with acute-onset chest pressure radiating to L arm, upper back, and interscapular region with associated symptoms of nausea, vomiting, and orthostatic hypotension.  Patient admitted for chest pain, had ischemic symptoms and uptrending troponin, underwent urgent LHC 01/24 with 100% distal LCx occlusion s/p balloon angioplasty.  PCCM was consulted for possible pressor requirement, blood pressure remained stable and downgraded to TRH. Hospital course as below    NSTEMI - s/p Balloon Angioplasty to Distal Lcx CAD Underwent urgent LHC 01/24 100% distal LCx occlusion with thrombus, s/p balloon angioplasty, no stent (vessel was fairly small). 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  Mild LVH.  Grade 1 DD.  Low-dose Coreg  Held ACE/ARB, can resume as blood pressure tolerates Euvolemic on exam   HTN Hold home amlodipine , hydrochlorothiazide , losartan  Resume losartan  as blood pressure tolerates   Hyperlipidemia Increase atorvastatin  20 mg to 80 mg daily   Mild leukocytosis Likely reactive, No symptoms/signs of infection Negative for pneumonia Monitor WBC outpatient within 1 week   Remote Seizure Disorder Seizure  precautions No AED therapy indicated d/c Tramadol  risk of lowering seizure threshold   Back pain - musculoskeletal Has been having bilateral upper back pain, worse with lifting anything and relieved with rest. CTA negative, dissection ruled out. Pain management with heating pads, lidocaine  patch, Tylenol    Obesity class I Body mass index is 30.24 kg/m. Outpatient follow up for lifestyle modification and risk factor management   Diagnostic Tests Reviewed/Disposition:   Consults:  Discharge Instructions  Disease/illness Education:  Home Health/Community Services Discussions/Referrals:  Establishment or re-establishment of referral orders for community resources:  Discussion with other health care providers:  Assessment and Support of treatment regimen adherence:  Appointments Coordinated with:   Education for self-management, independent living, and ADLs:   Relevant past medical, surgical, family and social history reviewed and updated as indicated. Interim medical history since our last visit reviewed. Allergies and medications reviewed and updated.  Review of Systems  Constitutional:  Positive for fatigue. Negative for activity change, appetite change, chills, diaphoresis, fever and unexpected weight change.  HENT: Negative.    Respiratory: Negative.    Cardiovascular: Negative.   Musculoskeletal: Negative.   Psychiatric/Behavioral: Negative.      Per HPI unless specifically indicated above     Objective:    There were no vitals taken for this visit.  Wt Readings from Last 3 Encounters:  06/22/24 210 lb 1.6 oz (95.3 kg)  03/05/24 196 lb (88.9 kg)  10/29/23 202 lb (91.6 kg)    Physical Exam Vitals and nursing note reviewed.  Constitutional:      General: He is not in acute distress.    Appearance: Normal appearance. He is not ill-appearing, toxic-appearing or diaphoretic.  HENT:     Head: Normocephalic and atraumatic.     Right Ear: External ear normal.      Left Ear: External ear normal.     Nose: Nose normal.     Mouth/Throat:     Mouth: Mucous membranes are moist.     Pharynx: Oropharynx is clear.  Eyes:     General: No scleral icterus.       Right eye: No discharge.        Left eye: No discharge.     Extraocular Movements: Extraocular movements intact.     Conjunctiva/sclera: Conjunctivae normal.     Pupils: Pupils are equal, round, and reactive to light.  Cardiovascular:     Rate and Rhythm: Normal rate. Rhythm irregular.     Pulses: Normal pulses.     Heart sounds: Normal heart sounds. No murmur heard.    No friction rub. No gallop.  Pulmonary:     Effort: Pulmonary effort is normal. No respiratory distress.     Breath sounds: Normal breath sounds. No stridor. No wheezing, rhonchi or rales.  Chest:     Chest wall: No tenderness.  Musculoskeletal:        General: Normal range of motion.     Cervical back: Normal range of motion and neck supple.  Skin:    General: Skin is warm and dry.     Capillary Refill: Capillary refill takes less than 2  seconds.     Coloration: Skin is not jaundiced or pale.     Findings: No bruising, erythema, lesion or rash.  Neurological:     General: No focal deficit present.     Mental Status: He is alert and oriented to person, place, and time. Mental status is at baseline.  Psychiatric:        Mood and Affect: Mood normal.        Behavior: Behavior normal.        Thought Content: Thought content normal.        Judgment: Judgment normal.     Results for orders placed or performed during the hospital encounter of 06/20/24  Basic metabolic panel   Collection Time: 06/20/24  8:49 AM  Result Value Ref Range   Sodium 138 135 - 145 mmol/L   Potassium 4.2 3.5 - 5.1 mmol/L   Chloride 105 98 - 111 mmol/L   CO2 21 (L) 22 - 32 mmol/L   Glucose, Bld 172 (H) 70 - 99 mg/dL   BUN 18 8 - 23 mg/dL   Creatinine, Ser 8.77 0.61 - 1.24 mg/dL   Calcium  9.1 8.9 - 10.3 mg/dL   GFR, Estimated >39 >39  mL/min   Anion gap 12 5 - 15  CBC   Collection Time: 06/20/24  8:49 AM  Result Value Ref Range   WBC 5.2 4.0 - 10.5 K/uL   RBC 5.40 4.22 - 5.81 MIL/uL   Hemoglobin 14.3 13.0 - 17.0 g/dL   HCT 54.4 60.9 - 47.9 %   MCV 84.3 80.0 - 100.0 fL   MCH 26.5 26.0 - 34.0 pg   MCHC 31.4 30.0 - 36.0 g/dL   RDW 84.9 88.4 - 84.4 %   Platelets 169 150 - 400 K/uL   nRBC 0.0 0.0 - 0.2 %  Lipase, blood   Collection Time: 06/20/24  8:49 AM  Result Value Ref Range   Lipase 21 11 - 51 U/L  Troponin T, High Sensitivity   Collection Time: 06/20/24  8:49 AM  Result Value Ref Range   Troponin T High Sensitivity 9 0 - 19 ng/L  Protime-INR   Collection Time: 06/20/24  8:59 AM  Result Value Ref Range   Prothrombin Time 14.9 11.4 - 15.2 seconds   INR 1.1 0.8 - 1.2  Troponin T, High Sensitivity   Collection Time: 06/20/24 10:00 AM  Result Value Ref Range   Troponin T High Sensitivity 13 0 - 19 ng/L  Troponin T, High Sensitivity   Collection Time: 06/20/24 11:14 AM  Result Value Ref Range   Troponin T High Sensitivity 23 (H) 0 - 19 ng/L  Magnesium   Collection Time: 06/20/24  1:47 PM  Result Value Ref Range   Magnesium 1.9 1.7 - 2.4 mg/dL  Hemoglobin J8r   Collection Time: 06/20/24  1:47 PM  Result Value Ref Range   Hgb A1c MFr Bld 6.7 (H) 4.8 - 5.6 %   Mean Plasma Glucose 145.59 mg/dL  Troponin T, High Sensitivity   Collection Time: 06/20/24  1:47 PM  Result Value Ref Range   Troponin T High Sensitivity 113 (HH) 0 - 19 ng/L  Pro Brain natriuretic peptide   Collection Time: 06/20/24  4:00 PM  Result Value Ref Range   Pro Brain Natriuretic Peptide 101.0 <300.0 pg/mL  Lipid panel   Collection Time: 06/20/24  4:00 PM  Result Value Ref Range   Cholesterol 91 0 - 200 mg/dL   Triglycerides 32 <849 mg/dL  HDL 36 (L) >40 mg/dL   Total CHOL/HDL Ratio 2.5 RATIO   VLDL 6 0 - 40 mg/dL   LDL Cholesterol 49 0 - 99 mg/dL  Troponin T, High Sensitivity   Collection Time: 06/20/24  4:00 PM  Result  Value Ref Range   Troponin T High Sensitivity 290 (HH) 0 - 19 ng/L  CBG monitoring, ED   Collection Time: 06/20/24  5:19 PM  Result Value Ref Range   Glucose-Capillary 163 (H) 70 - 99 mg/dL  Troponin T, High Sensitivity   Collection Time: 06/20/24  5:36 PM  Result Value Ref Range   Troponin T High Sensitivity 685 (HH) 0 - 19 ng/L  Troponin T, High Sensitivity   Collection Time: 06/20/24  8:35 PM  Result Value Ref Range   Troponin T High Sensitivity 1,249 (HH) 0 - 19 ng/L  POCT Activated clotting time   Collection Time: 06/20/24  9:36 PM  Result Value Ref Range   Activated Clotting Time 112 seconds  POCT Activated clotting time   Collection Time: 06/20/24  9:42 PM  Result Value Ref Range   Activated Clotting Time 127 seconds  POCT Activated clotting time   Collection Time: 06/20/24  9:54 PM  Result Value Ref Range   Activated Clotting Time 179 seconds  POCT Activated clotting time   Collection Time: 06/20/24 10:08 PM  Result Value Ref Range   Activated Clotting Time 291 seconds  Glucose, capillary   Collection Time: 06/20/24 10:39 PM  Result Value Ref Range   Glucose-Capillary 145 (H) 70 - 99 mg/dL  MRSA Next Gen by PCR, Nasal   Collection Time: 06/20/24 10:49 PM   Specimen: Nasal Mucosa; Nasal Swab  Result Value Ref Range   MRSA by PCR Next Gen NOT DETECTED NOT DETECTED  CBC   Collection Time: 06/20/24 11:21 PM  Result Value Ref Range   WBC 10.1 4.0 - 10.5 K/uL   RBC 5.24 4.22 - 5.81 MIL/uL   Hemoglobin 14.2 13.0 - 17.0 g/dL   HCT 58.2 60.9 - 47.9 %   MCV 79.6 (L) 80.0 - 100.0 fL   MCH 27.1 26.0 - 34.0 pg   MCHC 34.1 30.0 - 36.0 g/dL   RDW 85.3 88.4 - 84.4 %   Platelets 183 150 - 400 K/uL   nRBC 0.0 0.0 - 0.2 %  Creatinine, serum   Collection Time: 06/20/24 11:21 PM  Result Value Ref Range   Creatinine, Ser 1.11 0.61 - 1.24 mg/dL   GFR, Estimated >39 >39 mL/min  Troponin T, High Sensitivity   Collection Time: 06/20/24 11:21 PM  Result Value Ref Range    Troponin T High Sensitivity 5,511 (HH) 0 - 19 ng/L  Glucose, capillary   Collection Time: 06/20/24 11:46 PM  Result Value Ref Range   Glucose-Capillary 167 (H) 70 - 99 mg/dL  CBC   Collection Time: 06/21/24  1:21 AM  Result Value Ref Range   WBC 11.0 (H) 4.0 - 10.5 K/uL   RBC 5.18 4.22 - 5.81 MIL/uL   Hemoglobin 14.2 13.0 - 17.0 g/dL   HCT 59.4 60.9 - 47.9 %   MCV 78.2 (L) 80.0 - 100.0 fL   MCH 27.4 26.0 - 34.0 pg   MCHC 35.1 30.0 - 36.0 g/dL   RDW 85.3 88.4 - 84.4 %   Platelets 179 150 - 400 K/uL   nRBC 0.0 0.0 - 0.2 %  Basic metabolic panel   Collection Time: 06/21/24  1:21 AM  Result Value Ref Range  Sodium 138 135 - 145 mmol/L   Potassium 3.7 3.5 - 5.1 mmol/L   Chloride 102 98 - 111 mmol/L   CO2 24 22 - 32 mmol/L   Glucose, Bld 173 (H) 70 - 99 mg/dL   BUN 17 8 - 23 mg/dL   Creatinine, Ser 8.81 0.61 - 1.24 mg/dL   Calcium  9.3 8.9 - 10.3 mg/dL   GFR, Estimated >39 >39 mL/min   Anion gap 12 5 - 15  Lipoprotein A (LPA)   Collection Time: 06/21/24  1:21 AM  Result Value Ref Range   Lipoprotein (a) 172.4 (H) <75.0 nmol/L  Troponin T, High Sensitivity   Collection Time: 06/21/24  1:21 AM  Result Value Ref Range   Troponin T High Sensitivity 5,408 (HH) 0 - 19 ng/L  Basic metabolic panel   Collection Time: 06/21/24  4:06 AM  Result Value Ref Range   Sodium 138 135 - 145 mmol/L   Potassium 3.6 3.5 - 5.1 mmol/L   Chloride 103 98 - 111 mmol/L   CO2 24 22 - 32 mmol/L   Glucose, Bld 169 (H) 70 - 99 mg/dL   BUN 17 8 - 23 mg/dL   Creatinine, Ser 8.78 0.61 - 1.24 mg/dL   Calcium  9.1 8.9 - 10.3 mg/dL   GFR, Estimated >39 >39 mL/min   Anion gap 12 5 - 15  TSH   Collection Time: 06/21/24  4:06 AM  Result Value Ref Range   TSH 0.616 0.350 - 4.500 uIU/mL  T4, free   Collection Time: 06/21/24  4:06 AM  Result Value Ref Range   Free T4 1.14 0.80 - 2.00 ng/dL  Troponin T, High Sensitivity   Collection Time: 06/21/24  4:06 AM  Result Value Ref Range   Troponin T High  Sensitivity 4,130 (HH) 0 - 19 ng/L  Glucose, capillary   Collection Time: 06/21/24  8:12 AM  Result Value Ref Range   Glucose-Capillary 161 (H) 70 - 99 mg/dL  Glucose, capillary   Collection Time: 06/21/24 11:09 AM  Result Value Ref Range   Glucose-Capillary 154 (H) 70 - 99 mg/dL  ECHOCARDIOGRAM COMPLETE   Collection Time: 06/21/24  1:02 PM  Result Value Ref Range   Weight 3,372.16 oz   Height 70 in   BP 99/60 mmHg   Ao pk vel 1.20 m/s   AV Area VTI 2.45 cm2   AR max vel 2.24 cm2   AV Mean grad 3.0 mmHg   AV Peak grad 5.8 mmHg   S' Lateral 3.10 cm   AV Area mean vel 2.05 cm2   Area-P 1/2 4.36 cm2   MV VTI 1.36 cm2   Est EF 55 - 60%   Glucose, capillary   Collection Time: 06/21/24  3:36 PM  Result Value Ref Range   Glucose-Capillary 140 (H) 70 - 99 mg/dL  Glucose, capillary   Collection Time: 06/21/24  9:26 PM  Result Value Ref Range   Glucose-Capillary 125 (H) 70 - 99 mg/dL  CBC   Collection Time: 06/22/24  4:09 AM  Result Value Ref Range   WBC 11.8 (H) 4.0 - 10.5 K/uL   RBC 4.88 4.22 - 5.81 MIL/uL   Hemoglobin 13.1 13.0 - 17.0 g/dL   HCT 61.4 (L) 60.9 - 47.9 %   MCV 78.9 (L) 80.0 - 100.0 fL   MCH 26.8 26.0 - 34.0 pg   MCHC 34.0 30.0 - 36.0 g/dL   RDW 85.3 88.4 - 84.4 %   Platelets 162 150 -  400 K/uL   nRBC 0.0 0.0 - 0.2 %  Basic metabolic panel with GFR   Collection Time: 06/22/24  4:09 AM  Result Value Ref Range   Sodium 137 135 - 145 mmol/L   Potassium 3.8 3.5 - 5.1 mmol/L   Chloride 102 98 - 111 mmol/L   CO2 24 22 - 32 mmol/L   Glucose, Bld 142 (H) 70 - 99 mg/dL   BUN 16 8 - 23 mg/dL   Creatinine, Ser 8.89 0.61 - 1.24 mg/dL   Calcium  8.7 (L) 8.9 - 10.3 mg/dL   GFR, Estimated >39 >39 mL/min   Anion gap 10 5 - 15  Glucose, capillary   Collection Time: 06/22/24  8:18 AM  Result Value Ref Range   Glucose-Capillary 188 (H) 70 - 99 mg/dL  Glucose, capillary   Collection Time: 06/22/24 12:10 PM  Result Value Ref Range   Glucose-Capillary 135 (H) 70 - 99  mg/dL      Assessment & Plan:   Problem List Items Addressed This Visit   None    Follow up plan: No follow-ups on file.      "

## 2024-06-23 NOTE — Telephone Encounter (Signed)
Called to schedule. VM full.

## 2024-06-23 NOTE — Telephone Encounter (Signed)
-----   Message from Deatrice Cage, MD sent at 06/22/2024  8:45 AM EST ----- Office follow-up needed in 1 week with me or APP.

## 2024-06-24 ENCOUNTER — Telehealth: Payer: Self-pay

## 2024-06-24 ENCOUNTER — Telehealth: Payer: Self-pay | Admitting: Family Medicine

## 2024-06-24 NOTE — Assessment & Plan Note (Signed)
 Too soon to recheck lipids. Will recheck in about a month. Continue to monitor.

## 2024-06-24 NOTE — Assessment & Plan Note (Signed)
 No pain. Follow up with cardiology as needed. Will keep BP and cholesterol under good control. Call with any concerns.

## 2024-06-24 NOTE — Assessment & Plan Note (Signed)
 BP still running low. Will hold off on restarting his losartan  for now. Recheck in 1 month. Call with any concerns.

## 2024-06-24 NOTE — Assessment & Plan Note (Signed)
 BP remains low. Will hold on restarting his losartan  and recheck in 1 month. Call with any concerns.

## 2024-06-24 NOTE — Telephone Encounter (Signed)
 On this date 06/24/24  paperwork has been received on patient's behalf. The paperwork has been received via fax to our office.. The paperwork that has been received is Short Term Disability (STD).   Paperwork to be returned via Fax.   Paperwork to be placed in providers incoming folder to be worked by their team. Jud is expected to be completed and returned 5-7 business days.

## 2024-06-24 NOTE — Telephone Encounter (Signed)
 Called patient in regards to [facility] paperwork submitted by Tressie Kleine. Called patient no answer, voicemail is to full to leave a message. Placing FMLA paperwork in the incomplete folder until I can reach the patient.

## 2024-06-25 ENCOUNTER — Telehealth: Payer: Self-pay

## 2024-06-25 NOTE — Telephone Encounter (Signed)
 Returned patients phone call, I called in regards to [facility] paperwork that was sent by spouse. Patient stated he grants permission for the paperwork, I let him know that he needs to stop by the office and complete the questionnaire form.

## 2024-06-25 NOTE — Telephone Encounter (Signed)
 Returned patients call back to me calling him yesterday. Discussed with the patient that we need him to come by the office to fill out questionnaire so we can get things going with the FMLA paperwork from spouse.

## 2024-06-25 NOTE — Telephone Encounter (Signed)
 This encounter was created in error - please disregard.

## 2024-06-25 NOTE — Telephone Encounter (Signed)
 Telephone 06/24/2024 [facility]    Sessions, Callie, [place] F[place] Reason for conversation   All Conversations: FMLA (Newest Message First)              06/25/24  8:28 AM Sessions, Callie, CMA routed this conversation to [facility]  [facility] Sessions, Aline, CMA    06/25/24  8:28 AM Note Returned patients phone call, I called in regards to [facility] paperwork that was sent by spouse. Patient stated he grants permission for the paperwork, I let him know that he needs to stop by the office and complete the questionnaire form.       06/25/24  8:13 AM Pamelia Lesli CROME, NT routed this conversation to [facility]  Pamelia Lesli CROME, NT    06/25/24  8:13 AM Unsigned Note Copied from CRM #[id number]. Topic: General - Other >> Jun 24, 2024  5:23 PM Winona R wrote: Pt is returning office call about FMLA paperwork after office hours. Please contact the pt during business hours          06/24/24  5:16 PM Philippi, Ryan DEL contacted [place], Winona LABOR  Sessions, Callie, CMA    06/24/24  4:55 PM Note Called patient in regards to [facility] paperwork submitted by Tressie Kleine. Called patient no answer, voicemail is to full to leave a message. Placing FMLA paperwork in the incomplete folder until I can reach the patient.

## 2024-06-25 NOTE — Addendum Note (Signed)
 Addended by: JANEICE IZETTA HERO on: 06/25/2024 08:31 AM   Modules accepted: Orders, Level of Service

## 2024-06-25 NOTE — Telephone Encounter (Unsigned)
 Copied from CRM #[id number]. Topic: General - Other >> Jun 24, 2024  5:23 PM Winona R wrote: Pt is returning office call about FMLA paperwork after office hours. Please contact the pt during business hours

## 2024-06-30 NOTE — Telephone Encounter (Signed)
 Patient stopped by the office and completed the sign release form on 06/25/2024 which also allowed permission for paperwork to be completed for his wife employer and then have it be returned once completed.

## 2024-07-02 NOTE — Telephone Encounter (Signed)
 Patient's forms were faxed back 07/01/24

## 2024-07-21 ENCOUNTER — Ambulatory Visit: Admitting: Family Medicine

## 2024-09-03 ENCOUNTER — Ambulatory Visit: Admitting: Family Medicine

## 2024-10-21 ENCOUNTER — Other Ambulatory Visit

## 2024-10-27 ENCOUNTER — Ambulatory Visit

## 2024-10-27 ENCOUNTER — Ambulatory Visit: Admitting: Urology
